# Patient Record
Sex: Female | Born: 1960
Health system: Southern US, Community
[De-identification: ages and names within clinical notes are randomized; demographics above are authoritative.]

## PROBLEM LIST (undated history)

## (undated) DIAGNOSIS — F419 Anxiety disorder, unspecified: Secondary | ICD-10-CM

## (undated) DIAGNOSIS — E119 Type 2 diabetes mellitus without complications: Secondary | ICD-10-CM

## (undated) DIAGNOSIS — E079 Disorder of thyroid, unspecified: Secondary | ICD-10-CM

## (undated) DIAGNOSIS — I10 Essential (primary) hypertension: Secondary | ICD-10-CM

## (undated) DIAGNOSIS — F32A Depression, unspecified: Secondary | ICD-10-CM

## (undated) HISTORY — DX: Essential (primary) hypertension: I10

## (undated) HISTORY — PX: BACK SURGERY: SHX140

## (undated) HISTORY — DX: Anxiety disorder, unspecified: F41.9

## (undated) HISTORY — DX: Type 2 diabetes mellitus without complications: E11.9

## (undated) HISTORY — DX: Depression, unspecified: F32.A

## (undated) HISTORY — PX: BUNIONECTOMY: SHX129

## (undated) HISTORY — DX: Disorder of thyroid, unspecified: E07.9

---

## 1998-05-05 ENCOUNTER — Other Ambulatory Visit: Admission: RE | Admit: 1998-05-05 | Discharge: 1998-05-05 | Payer: Self-pay | Admitting: Obstetrics and Gynecology

## 1999-04-13 ENCOUNTER — Other Ambulatory Visit: Admission: RE | Admit: 1999-04-13 | Discharge: 1999-04-13 | Payer: Self-pay | Admitting: *Deleted

## 1999-04-29 ENCOUNTER — Emergency Department (HOSPITAL_COMMUNITY): Admission: EM | Admit: 1999-04-29 | Discharge: 1999-04-29 | Payer: Self-pay

## 2000-04-28 ENCOUNTER — Other Ambulatory Visit: Admission: RE | Admit: 2000-04-28 | Discharge: 2000-04-28 | Payer: Self-pay | Admitting: *Deleted

## 2001-05-08 ENCOUNTER — Other Ambulatory Visit: Admission: RE | Admit: 2001-05-08 | Discharge: 2001-05-08 | Payer: Self-pay | Admitting: Obstetrics and Gynecology

## 2002-05-27 ENCOUNTER — Other Ambulatory Visit: Admission: RE | Admit: 2002-05-27 | Discharge: 2002-05-27 | Payer: Self-pay | Admitting: Obstetrics and Gynecology

## 2004-05-20 ENCOUNTER — Emergency Department (HOSPITAL_COMMUNITY): Admission: EM | Admit: 2004-05-20 | Discharge: 2004-05-20 | Payer: Self-pay | Admitting: Emergency Medicine

## 2006-06-02 ENCOUNTER — Emergency Department (HOSPITAL_COMMUNITY): Admission: EM | Admit: 2006-06-02 | Discharge: 2006-06-02 | Payer: Self-pay | Admitting: Emergency Medicine

## 2010-11-01 ENCOUNTER — Other Ambulatory Visit: Payer: Self-pay | Admitting: Internal Medicine

## 2010-11-01 DIAGNOSIS — Z1231 Encounter for screening mammogram for malignant neoplasm of breast: Secondary | ICD-10-CM

## 2010-11-05 ENCOUNTER — Ambulatory Visit
Admission: RE | Admit: 2010-11-05 | Discharge: 2010-11-05 | Disposition: A | Discharge status: Home / Self Care | Payer: BC Managed Care – PPO | Source: Ambulatory Visit | Attending: Internal Medicine | Admitting: Internal Medicine

## 2010-11-05 DIAGNOSIS — Z1231 Encounter for screening mammogram for malignant neoplasm of breast: Secondary | ICD-10-CM

## 2011-11-21 ENCOUNTER — Other Ambulatory Visit: Payer: Self-pay | Admitting: Physician Assistant

## 2011-11-21 ENCOUNTER — Other Ambulatory Visit: Payer: Self-pay | Admitting: Internal Medicine

## 2011-11-21 DIAGNOSIS — Z1231 Encounter for screening mammogram for malignant neoplasm of breast: Secondary | ICD-10-CM

## 2011-11-28 ENCOUNTER — Ambulatory Visit
Admission: RE | Admit: 2011-11-28 | Discharge: 2011-11-28 | Disposition: A | Discharge status: Home / Self Care | Payer: BC Managed Care – PPO | Source: Ambulatory Visit | Attending: Internal Medicine | Admitting: Internal Medicine

## 2011-11-28 DIAGNOSIS — Z1231 Encounter for screening mammogram for malignant neoplasm of breast: Secondary | ICD-10-CM

## 2012-10-23 ENCOUNTER — Other Ambulatory Visit: Payer: Self-pay

## 2012-10-23 DIAGNOSIS — Z1231 Encounter for screening mammogram for malignant neoplasm of breast: Secondary | ICD-10-CM

## 2012-12-05 ENCOUNTER — Ambulatory Visit
Admission: RE | Admit: 2012-12-05 | Discharge: 2012-12-05 | Disposition: A | Discharge status: Home / Self Care | Payer: BC Managed Care – PPO | Source: Ambulatory Visit

## 2012-12-05 DIAGNOSIS — Z1231 Encounter for screening mammogram for malignant neoplasm of breast: Secondary | ICD-10-CM

## 2013-05-03 ENCOUNTER — Ambulatory Visit (INDEPENDENT_AMBULATORY_CARE_PROVIDER_SITE_OTHER): Payer: BC Managed Care – PPO

## 2013-05-03 VITALS — BP 129/71 | HR 69 | Resp 20 | Ht 69.5 in | Wt 190.0 lb

## 2013-05-03 DIAGNOSIS — M21621 Bunionette of right foot: Secondary | ICD-10-CM

## 2013-05-03 DIAGNOSIS — R52 Pain, unspecified: Secondary | ICD-10-CM

## 2013-05-03 DIAGNOSIS — M21619 Bunion of unspecified foot: Secondary | ICD-10-CM

## 2013-05-03 DIAGNOSIS — E039 Hypothyroidism, unspecified: Secondary | ICD-10-CM | POA: Insufficient documentation

## 2013-05-03 DIAGNOSIS — E089 Diabetes mellitus due to underlying condition without complications: Secondary | ICD-10-CM | POA: Insufficient documentation

## 2013-05-03 DIAGNOSIS — M2010 Hallux valgus (acquired), unspecified foot: Secondary | ICD-10-CM

## 2013-05-03 DIAGNOSIS — I10 Essential (primary) hypertension: Secondary | ICD-10-CM | POA: Insufficient documentation

## 2013-05-03 DIAGNOSIS — E079 Disorder of thyroid, unspecified: Secondary | ICD-10-CM | POA: Insufficient documentation

## 2013-05-03 DIAGNOSIS — M2011 Hallux valgus (acquired), right foot: Secondary | ICD-10-CM

## 2013-05-03 DIAGNOSIS — E109 Type 1 diabetes mellitus without complications: Secondary | ICD-10-CM | POA: Insufficient documentation

## 2013-05-03 DIAGNOSIS — E0869 Diabetes mellitus due to underlying condition with other specified complication: Secondary | ICD-10-CM | POA: Insufficient documentation

## 2013-05-03 NOTE — Patient Instructions (Signed)
Pre-Operative Instructions  Congratulations, you have decided to take an important step to improving your quality of life.  You can be assured that the doctors of [facility] will be with you every step of the way.  1. Plan to be at the surgery center/hospital at least 1 (one) hour prior to your scheduled time unless otherwise directed by the surgical center/hospital staff.  You must have a responsible adult accompany you, remain during the surgery and drive you home.  Make sure you have directions to the surgical center/hospital and know how to get there on time. 2. For hospital based surgery you will need to obtain a history and physical form from your family physician within 1 month prior to the date of surgery- we will give you a form for you primary physician.  3. We make every effort to accommodate the date you request for surgery.  There are however, times where surgery dates or times have to be moved.  We will contact you as soon as possible if a change in schedule is required.   4. No Aspirin/Ibuprofen for one week before surgery.  If you are on aspirin, any non-steroidal anti-inflammatory medications (Mobic, Aleve, Ibuprofen) you should stop taking it 7 days prior to your surgery.  You make take Tylenol  For pain prior to surgery.  5. Medications- If you are taking daily heart and blood pressure medications, seizure, reflux, allergy, asthma, anxiety, pain or diabetes medications, make sure the surgery center/hospital is aware before the day of surgery so they may notify you which medications to take or avoid the day of surgery. 6. No food or drink after midnight the night before surgery unless directed otherwise by surgical center/hospital staff. 7. No alcoholic beverages 24 hours prior to surgery.  No smoking 24 hours prior to or 24 hours after surgery. 8. Wear loose pants or shorts- loose enough to fit over bandages, boots, and casts. 9. No slip on shoes, sneakers are best. 10. Bring  your boot with you to the surgery center/hospital.  Also bring crutches or a walker if your physician has prescribed it for you.  If you do not have this equipment, it will be provided for you after surgery. 11. If you have not been contracted by the surgery center/hospital by the day before your surgery, call to confirm the date and time of your surgery. 12. Leave-time from work may vary depending on the type of surgery you have.  Appropriate arrangements should be made prior to surgery with your employer. 13. Prescriptions will be provided immediately following surgery by your doctor.  Have these filled as soon as possible after surgery and take the medication as directed. 14. Remove nail polish on the operative foot. 15. Wash the night before surgery.  The night before surgery wash the foot and leg well with the antibacterial soap provided and water paying special attention to beneath the toenails and in between the toes.  Rinse thoroughly with water and dry well with a towel.  Perform this wash unless told not to do so by your physician.  Enclosed: 1 Ice pack (please put in freezer the night before surgery)   1 Hibiclens skin cleaner   Pre-op Instructions  If you have any questions regarding the instructions, do not hesitate to call our office.  [place]: [address] [place], [place] [address] [phone]  [place]: [address] [place], [place] [address] [phone]  [place]: [address]  [place],  [address] [phone]  Dr. Bethel Gaglio   Tuchman DPM, Dr. Norman Regal DPM Dr. Tionne Dayhoff DPM, Dr. M. Todd Hyatt DPM, Dr. Kathryn Egerton DPM 

## 2013-05-03 NOTE — Progress Notes (Signed)
Subjective:    Patient ID: Julia Holt, female    DOB: [birth year], 52 y.o.   MRN: [id number] "I want to have surgery in January on my right foot for a Bunion and a Bunionette."  HPI patient is approximately one year after surgical repair of her contralateral or left foot and is pleased with her results. Continues to have some contracture at the left great toe joint however no pain or discomfort. Right foot has mild plantar fascial symptomology may address with orthoses in the future however does have some dorsal medial bunion deformity as well as tailor bunion deformity and prominence of the fifth MTP area right foot.    Review of Systems  Constitutional: Negative.   Eyes: Negative.   Respiratory: Negative.   Cardiovascular: Negative.   Gastrointestinal: Negative.   Endocrine: Negative.   Musculoskeletal: Negative.   Skin: Negative.   Allergic/Immunologic: Negative.   Neurological: Negative.   Hematological: Negative.   Psychiatric/Behavioral: Negative.   All other systems reviewed and are negative.       Objective:   Physical Exam  Vitals reviewed. Constitutional: She is oriented to person, place, and time. She appears well-developed and well-nourished.  Cardiovascular:  Pulses:      Dorsalis pedis pulses are 2+ on the right side, and 2+ on the left side.       Posterior tibial pulses are 2+ on the right side, and 2+ on the left side.  Capillary refill time 3 seconds all digits. Skin temperature warm turgor normal. No edema rubor pallor or varicosities noted. Well-healed incisions first and fifth metatarsal areas left foot.  Musculoskeletal:  Orthopedic biomechanical exam reveals rectus foot type left status post bunion and tailor bunion correction still slight elevation of the hallux with limitation of motion plantar flexion. Right foot has pain and capsulitis on dorsiflexion plantarflexion of the great toe joint. There is dorsal and medial eminence present clinically and  radiographically has a long first metatarsal with deviation of sesamoids. There is also significant tailor bunion deformity with keratoses sub-fifth MTP area right. Per patient request she is interested in bunion and tailor bunion correction of the right foot at this time.  Neurological: She is alert and oriented to person, place, and time. She has normal strength and normal reflexes.  Epicritic and proprioceptive sensations intact and symmetric bilateral normal plantar response and DTRs noted.  Skin: Skin is warm and dry. No cyanosis. Nails show no clubbing.  Dermatologic the skin color and pigment normal hair growth absent bilateral nails unremarkable healed incisions first and fifth metatarsal areas left foot  Psychiatric: She has a normal mood and affect. Her behavior is normal.          Assessment & Plan:  Assessment this time is mild to moderate bunion deformity right with long first metatarsal and mild functional hallux limitus right foot with capsulitis. There is also tailor bunion deformity right foot with digital contracture. Flexible contractures third and fourth digits were also noted by the patient these are asymptomatic. Plan at this time discussed options x-rays were reviewed and at this time consent form for decompression Austin bunionectomy with pin fixation right as well and this tailor bunionectomy with fifth metatarsal osteotomy and screw fixation right. Consent form was reviewed and signed all questions asked medication are answered surgery be scheduled at her convenience with appropriate followup thereafter. Should note patient does have diabetes with insulin dependence she is on an insulin pump her sugars well-managed last A1c was 5.8. Begin  diabetes with no complications noted.  Alvan Dame DPM

## 2013-06-18 ENCOUNTER — Telehealth: Payer: Self-pay | Admitting: *Deleted

## 2013-06-18 NOTE — Telephone Encounter (Signed)
Pt states lost the pamphlet to [facility] and needed the time and directions to their facility.  Pt also described her pre-op bag and may have lost the pre-op instruction as well. I faxed a copy of the [facility] pamphlet back with a map and the pre-op instructions.

## 2013-06-27 ENCOUNTER — Telehealth: Payer: Self-pay | Admitting: *Deleted

## 2013-06-27 NOTE — Telephone Encounter (Signed)
I rescheduled pt to 07/01/2013, due to Dr Blenda Mounts had a family emergency and would be out of town.  Pt agreed.  I also informed [facility].  These changes were done 06/24/2013.

## 2013-07-08 DIAGNOSIS — M19079 Primary osteoarthritis, unspecified ankle and foot: Secondary | ICD-10-CM

## 2013-07-08 DIAGNOSIS — M2010 Hallux valgus (acquired), unspecified foot: Secondary | ICD-10-CM

## 2013-07-08 DIAGNOSIS — M2020 Hallux rigidus, unspecified foot: Secondary | ICD-10-CM

## 2013-07-08 DIAGNOSIS — M21619 Bunion of unspecified foot: Secondary | ICD-10-CM

## 2013-07-09 ENCOUNTER — Telehealth: Payer: Self-pay | Admitting: *Deleted

## 2013-07-09 NOTE — Telephone Encounter (Signed)
Pt states the 04/2013 boot doesn't hold pressure when pumped up, will send her husband to pick up a large Air Fracture walker.

## 2013-07-09 NOTE — Telephone Encounter (Signed)
Pt states the nurse at the surgical put on a boot that was too small and her toes hang over.  Pt states she still has the boot from 04/2013 surgery, can she change to it.  I told her that would be fine as long as it still pumped up.  Pt agreed.

## 2013-07-10 NOTE — Telephone Encounter (Signed)
Called pt. States that she was given and fitted for a medium boot initially. Her husband came to pick up large boot and she feels more comfortable now. Pt asking if she was charged for 2 boots? Will refer to insurance and billing for response.

## 2013-07-16 ENCOUNTER — Ambulatory Visit (INDEPENDENT_AMBULATORY_CARE_PROVIDER_SITE_OTHER): Payer: BC Managed Care – PPO

## 2013-07-16 VITALS — BP 123/75 | HR 80 | Resp 16

## 2013-07-16 DIAGNOSIS — Z9889 Other specified postprocedural states: Secondary | ICD-10-CM

## 2013-07-16 DIAGNOSIS — M21619 Bunion of unspecified foot: Secondary | ICD-10-CM

## 2013-07-16 DIAGNOSIS — E109 Type 1 diabetes mellitus without complications: Secondary | ICD-10-CM

## 2013-07-16 DIAGNOSIS — M21621 Bunionette of right foot: Secondary | ICD-10-CM

## 2013-07-16 DIAGNOSIS — M2010 Hallux valgus (acquired), unspecified foot: Secondary | ICD-10-CM

## 2013-07-16 NOTE — Patient Instructions (Signed)

## 2013-07-16 NOTE — Progress Notes (Signed)
   Subjective:    Patient ID: KAYSHA PARSELL, female    DOB: 04/17/1961, 53 y.o.   MRN: [id number]  HPI Comments: "It has been doing okay"  DOS 07-08-2013 POV Bunionectomy and met osteo 5th right      Review of Systems no new finding     Objective:   Physical Exam Neurovascular status intact incisions clean dry well coapted dressings intact and dry slight ecchymosis and edema consistent with postop course edema of lesser digits and ecchymosis of toes. Otherwise findings unremarkable incision well coapted x-rays reveal good position the osteotomy first and fifth metatarsals with intact fixation. Patient having little or no pain or discomfort. Air fracture boot intact for ambulation. The boot is reapplied after dressing change patient is also dispensed anklet to maintain compression starting Sunday after removing dressings a resume normal bathing and hygiene. Good postop progress noted       Assessment & Plan:  Assessment good postop progress following Austin bunionectomy right foot as well as tailor bunionectomy right foot. Anklet is dispensed at this time maintain air fracture boot till Sunday start normal bathing and hygiene thereafter and maintain anklet. 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 Also dispensed and Parking permit application for 6 months. Reappointed 4 weeks for followup x-ray and reevaluation. Maintain weightbearing in the air fracture boot at all times until that point. Do active passive range of motion exercises of toes as instructed.  Harriet Masson DPM

## 2013-08-14 ENCOUNTER — Ambulatory Visit (INDEPENDENT_AMBULATORY_CARE_PROVIDER_SITE_OTHER): Payer: BC Managed Care – PPO

## 2013-08-14 VITALS — BP 131/80 | HR 75 | Resp 18

## 2013-08-14 DIAGNOSIS — M21619 Bunion of unspecified foot: Secondary | ICD-10-CM

## 2013-08-14 DIAGNOSIS — M21621 Bunionette of right foot: Secondary | ICD-10-CM

## 2013-08-14 DIAGNOSIS — M2010 Hallux valgus (acquired), unspecified foot: Secondary | ICD-10-CM

## 2013-08-14 DIAGNOSIS — Z09 Encounter for follow-up examination after completed treatment for conditions other than malignant neoplasm: Secondary | ICD-10-CM

## 2013-08-14 NOTE — Progress Notes (Signed)
   Subjective:    Patient ID: Julia Holt, female    DOB: [birth year], 53 y.o.   MRN: [id number]  HPI I had my surgery on 07-08-2013 and I do have some sutures that did come out and not too much pain and I am ready to get this boot off    Review of Systems no new changes or fine     Objective:   Physical Exam Neurovascular status is intact pedal pulses palpable there is good range of motion dorsiflexion still some tightness and plantar flexion of the right great toe joint the fifth toe is a good rectus position incisions clean dry well coapted x-rays reveal good consolidation of the osteotomies and intact fixation no displacements noted.       Assessment & Plan:  Assessment good clinical and radiographic is good postop progress following Austin bunionectomy right foot as well as tailor bunion right foot with screw fixation. Maintain anklet for compression may discontinue boot and resume come for walking tennis or athletic shoe for the next 2 months. Reappointed 2 months for long-term postop followup. 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 Also read or additional 6 months of handicap parking excess. Followup in 2 months for postop evaluation contact me change difficulties in the interim.  Harriet Masson DPM

## 2013-08-14 NOTE — Patient Instructions (Signed)
Betadine Soak Instructions  Purchase an 8 oz. bottle of BETADINE solution (Povidone)  THE DAY AFTER THE PROCEDURE  Place 1 tablespoon of betadine solution in a quart of warm tap water.  Submerge your foot or feet with outer bandage intact for the initial soak; this will allow the bandage to become moist and wet for easy lift off.  Once you remove your bandage, continue to soak in the solution for 20 minutes.  This soak should be done twice a day.  Next, remove your foot or feet from solution, blot dry the affected area and cover.  You may use a band aid large enough to cover the area or use gauze and tape.  Apply other medications to the area as directed by the doctor such as cortisporin otic solution (ear drops) or neosporin.  IF YOUR SKIN BECOMES IRRITATED WHILE USING THESE INSTRUCTIONS, IT IS OKAY TO SWITCH TO EPSOM SALTS AND WATER OR WHITE VINEGAR AND WATER.  Continue with range of motion exercises moving a great toe both upper and down. You 100 toe pushups a day.

## 2013-08-27 ENCOUNTER — Telehealth: Payer: Self-pay | Admitting: *Deleted

## 2013-08-27 NOTE — Telephone Encounter (Signed)
Pt states she can not wear the [employer] athletic shoe on her right foot, as Dr Blenda Mounts recommended.  Pt states she thinks she had this same problem with the left foot post-op last year.  I told pt to gradually go into the athletic shoe, by starting out in the compression sock and athletic shoe 1st thing in the morning and either wear until she has the swelling discomfort or wear 1 to 2 hours and then switch into the surgical shoe, either way try to increase the athletic shoe wearing time by 15 to 30 minutes each day.  Pt agrees.

## 2013-09-23 ENCOUNTER — Telehealth: Payer: Self-pay | Admitting: *Deleted

## 2013-09-23 NOTE — Telephone Encounter (Signed)
I'm freaking out.  I had a Bunion surgery and a hammertoe surgery done.  I missed my second follow up appointment.  My toe is sticking up.  I don't know what he's going to do.  I've tried to bring it down.  This didn't happen with the other foot.  I've been exercising it like he suggested.  I told her he would xray it tomorrow and may give her a splint to help reposition it.  She seemed to be calmed a little.  She said she'd see [place] tomorrow.

## 2013-09-24 ENCOUNTER — Ambulatory Visit (INDEPENDENT_AMBULATORY_CARE_PROVIDER_SITE_OTHER): Payer: BC Managed Care – PPO

## 2013-09-24 VITALS — BP 138/79 | HR 78 | Resp 16

## 2013-09-24 DIAGNOSIS — Z9889 Other specified postprocedural states: Secondary | ICD-10-CM

## 2013-09-24 DIAGNOSIS — M21621 Bunionette of right foot: Secondary | ICD-10-CM

## 2013-09-24 DIAGNOSIS — M2010 Hallux valgus (acquired), unspecified foot: Secondary | ICD-10-CM

## 2013-09-24 DIAGNOSIS — M21619 Bunion of unspecified foot: Secondary | ICD-10-CM

## 2013-09-24 NOTE — Patient Instructions (Signed)
ICE INSTRUCTIONS  Apply ice or cold pack to the affected area at least 3 times a day for 10-15 minutes each time.  You should also use ice after prolonged activity or vigorous exercise.  Do not apply ice longer than 20 minutes at one time.  Always keep a cloth between your skin and the ice pack to prevent burns.  Being consistent and following these instructions will help control your symptoms.  We suggest you purchase a gel ice pack because they are reusable and do bit leak.  Some of them are designed to wrap around the area.  Use the method that works best for you.  Here are some other suggestions for icing.   Use a frozen bag of peas or corn-inexpensive and molds well to your body, usually stays frozen for 10 to 20 minutes.  Wet a towel with cold water and squeeze out the excess until it's damp.  Place in a bag in the freezer for 20 minutes. Then remove and use.  Alternate warm compresses and ice packs after aggressive exercising of the great toe joint. The distress 100-200 toe  pull downs. Aggressively plantar flexor pull down on the great toe joint giving counterpressure to the ball of the foot as demonstrated in the office keep doing his aggressive stretching exercises in the down fashion the up movement is already excellent does not need to be addressed continue with the down stretching exercises 3 or 4 times daily followup in 3 months if no improvement

## 2013-09-24 NOTE — Progress Notes (Signed)
   Subjective:    Patient ID: MIONNA ADVINCULA, female    DOB: 1961-03-05, 53 y.o.   MRN: [id number]  HPI Comments: "My big toe is not laying flat"  DOS 07-08-2013 POV Austin bunionectomy and tailor's bunionectomy right foot      Review of Systems noted changes or findings     Objective:   Physical Exam Extremity objective findings as follows pedal pulses are palpable epicritic and proprioceptive sensations intact there is normal plantar response DTRs not elicited dermatologically skin color pigment normal incision well coapted there is contracture the dorsal incision noted patient has been doing aggressive dorsiflexion exercises however on plantar flexion has been plantar flexing the IP joint and at the MTP joint. Patient continues have contracture at the MTP joint slightly dorsally displaced although at this time demonstrated again proper plantar flexion exercises degree joint utilizing both left and right hand to help with the toe appropriately. Patient will do 100 200 to pull downs daily as instructed today should improve her range of motion exercises is able to get approximately 70 dorsiflexion but cannot get down to you neutral plantar flexion about 5 dorsiflex patient will continue with aggressive motion did give her the option of physical therapy which declined at this time we'll do her own therapy at home daily.       Assessment & Plan:  Assessment good postop progress consolidation the osteotomies is noted first and fifth metatarsals only concern is a lack of plantar flexion to purchase the hallux patient is advised appropriate range of motion exercises at this time reassess in 1-3 months if no improvement consider physical therapy possibly the steroid injections if the joint maintain stiffness. Next  Harriet Masson DPM

## 2013-10-16 ENCOUNTER — Other Ambulatory Visit: Payer: Self-pay

## 2013-10-16 DIAGNOSIS — Z1231 Encounter for screening mammogram for malignant neoplasm of breast: Secondary | ICD-10-CM

## 2013-11-05 ENCOUNTER — Ambulatory Visit: Payer: BC Managed Care – PPO

## 2013-12-09 ENCOUNTER — Ambulatory Visit
Admission: RE | Admit: 2013-12-09 | Discharge: 2013-12-09 | Disposition: A | Discharge status: Home / Self Care | Payer: BC Managed Care – PPO | Source: Ambulatory Visit

## 2013-12-09 DIAGNOSIS — Z1231 Encounter for screening mammogram for malignant neoplasm of breast: Secondary | ICD-10-CM

## 2013-12-25 ENCOUNTER — Ambulatory Visit: Payer: Self-pay

## 2013-12-25 DIAGNOSIS — Z9889 Other specified postprocedural states: Secondary | ICD-10-CM

## 2014-04-16 ENCOUNTER — Encounter: Payer: Self-pay | Admitting: Podiatry

## 2014-04-16 ENCOUNTER — Ambulatory Visit (INDEPENDENT_AMBULATORY_CARE_PROVIDER_SITE_OTHER): Payer: BC Managed Care – PPO | Admitting: Podiatry

## 2014-04-16 ENCOUNTER — Ambulatory Visit (INDEPENDENT_AMBULATORY_CARE_PROVIDER_SITE_OTHER): Payer: BC Managed Care – PPO

## 2014-04-16 VITALS — BP 122/68 | HR 71 | Resp 14 | Ht 69.5 in | Wt 180.0 lb

## 2014-04-16 DIAGNOSIS — M2010 Hallux valgus (acquired), unspecified foot: Secondary | ICD-10-CM

## 2014-04-16 DIAGNOSIS — M216X1 Other acquired deformities of right foot: Secondary | ICD-10-CM

## 2014-04-16 DIAGNOSIS — M2061 Acquired deformities of toe(s), unspecified, right foot: Secondary | ICD-10-CM

## 2014-04-16 DIAGNOSIS — M216X2 Other acquired deformities of left foot: Secondary | ICD-10-CM

## 2014-04-16 NOTE — Progress Notes (Signed)
   Subjective:    Patient ID: Julia Holt, female    DOB: 03-01-1961, 53 y.o.   MRN: [id number]  HPI Comments: Patient returns to the office today with complaints of her right big toe "sticking up". She states that she recently underwent a bunionectomy on 07/08/2013 by Dr. Blenda Mounts. After the surgery she states that she did not have great motion and she was doing range of motion exercises. She states that over the last couple months she has noticed increasing elevation to the right big toe. She states that the areas painful particularly when walking on hardwood floors. She has no pain directly over the digit. She states that she is diabetic and she is concerned about her feet. She is continuing with range of motion exercises without any resolution. Her last HbA1c was 5.6 check 2 months ago. She denies any history of ulceration, claudication symptoms, tingling or numbness. No other complaints at this time.      Review of Systems  Endocrine:       2 months ago A1C 5.6  All other systems reviewed and are negative.      Objective:   Physical Exam AAO x3, NAD DP/PT pulses palpable bilaterally, CRT less than 3 seconds Protective sensation intact with Simms Weinstein monofilament, vibratory sensation intact, Achilles tendon reflex intact Right hallux elevation of the level of the MTPJ upon weightbearing. There is no overlying erythema or open lesions over the dorsal aspect of the hallux. There is a decrease in plantar flexion of the first MTPJ. The extensor tendon does not appear to be tight. There is prominence and the sesamoids as well as the lesser metatarsal heads. There is atrophy of the fat pad. There is a hyperkeratotic lesion right foot submetatarsal 2 and submetatarsal 5. On the left foot there is a scar from prior bunionectomy. Submetatarsal 2 hyperkeratotic lesion as well as submetatarsal 5. Hammertoe contractures on the right foot. Decrease in medial arch height on weightbearing. No open  lesions. No calf pain with compression, swelling, warmth, erythema      Assessment & Plan:  53 year old female with elevation of the right hallux status post bunionectomy. This appears to be more of a structural problem as the extensor tendon does not appear to be tight. -X-rays were obtained and reviewed with the patient. -Conservative versus surgical treatment discussed including alternatives, risks, complications. -At this time recommend custom inserts to help alleviate high-pressure areas to help prevent any skin breakdown. Scans were taken today and sent to [facility].  -Also she was inquiring about other treatments to help her motion. I prescribed physical therapy for her to see if there are any modalities that can help her symptoms. -Follow-up after the inserts are made or sooner if they've problems are to arise. In meantime call the office with any questions, concerns.

## 2014-04-18 ENCOUNTER — Telehealth: Payer: Self-pay | Admitting: *Deleted

## 2014-04-18 NOTE — Telephone Encounter (Signed)
"  I came to see Dr. Jacqualyn Posey Wednesday morning.  I was fitted for a pair of orthotics, takes 3 weeks for them to come in.  I was wondering if someone could please call me?  I have a couple of questions about orthotics."  I returned her call.  She stated, "I spoke to someone this morning and she said that she was going to get someone to check my insurance to see if it will be covered.  I don't want to end up having to pay $500 out of pocket for those."  I told her I would check on the status of that.  I spoke to [place] in insurance and she said she would check on it.  I informed the patient.

## 2014-05-09 ENCOUNTER — Ambulatory Visit: Payer: BC Managed Care – PPO

## 2014-05-09 DIAGNOSIS — M2010 Hallux valgus (acquired), unspecified foot: Secondary | ICD-10-CM

## 2014-05-09 NOTE — Progress Notes (Signed)
Pt is here to PUO 

## 2014-05-09 NOTE — Patient Instructions (Signed)

## 2014-05-20 ENCOUNTER — Telehealth: Payer: Self-pay | Admitting: *Deleted

## 2014-05-20 NOTE — Telephone Encounter (Signed)
Pt states she took the factory inserts from a pair of strap-shoes, now they are too loose, what to do?  I encouraged pt to try the orthotics again over top the factory inserts to see if that took up the additional space, could also try thicker socks or we may have a in-between thickness insert that may work.  Pt agreed.

## 2014-06-04 ENCOUNTER — Ambulatory Visit (INDEPENDENT_AMBULATORY_CARE_PROVIDER_SITE_OTHER): Payer: BC Managed Care – PPO | Admitting: Podiatry

## 2014-06-04 VITALS — BP 120/70 | HR 61 | Resp 16

## 2014-06-04 DIAGNOSIS — M79673 Pain in unspecified foot: Secondary | ICD-10-CM

## 2014-06-04 DIAGNOSIS — Q828 Other specified congenital malformations of skin: Secondary | ICD-10-CM

## 2014-06-04 DIAGNOSIS — D239 Other benign neoplasm of skin, unspecified: Secondary | ICD-10-CM

## 2014-06-04 NOTE — Patient Instructions (Signed)
Remove bandage tomorrow. If the areas becomes painful or causes problems before, take the bandage off and wash the area.  Monitor for any signs/symptoms of infection. Call the office immediately if any occur or go directly to the emergency room. Call with any questions/concerns.

## 2014-06-08 NOTE — Progress Notes (Signed)
Patient ID: Julia Holt, female   DOB: 09-Jan-1961, 53 y.o.   MRN: [id number]  Subjective: 53 year old female presents the office today with complaints of a painful corn on the bottom of her left foot. She states that the area is painful particularly with pressure in shoe gear. She has started to wear orthotics some and feels that they are fitting comfortably. She is unsure of this callus started before or after starting to wear the orthotics. Her last blood sugar was 106. Denies any acute changes his last appointment and no other complaints at this time. Denies any systemic complaints as fevers, chills, nausea, vomiting.  Objective: AAO 3, NAD DP/PT pulses palpable bilaterally, CRT less than 3 seconds Protective sensation intact with Simms Weinstein monofilament, vibratory sensation intact, Achilles tendon reflex intact. Hyperkeratotic lesions left foot submetatarsal 4 and 5.  Upon debridement no open lesions. There is no clinical signs of infection. There is no overlying edema, erythema, increase in warmth. There is mild atrophy of the fat pad. No areas of pinpoint bony tenderness or pain with vibratory sensation. No overlying edema, erythema, increase in warmth to bilateral lower extremity's. No pain with calf compression, swelling, warmth, erythema.  Assessment: 53 year old female with symptomatic porokeratosis left foot submetatarsal 4/5.  Plan: -Treatment options were discussed the patient including alternatives, risks, complications. -Lesions are sharply debrided 2 without complications. Salinocaine was applied over the fourth metatarsal porokeratosis with a donut pad and occlusive dressing. Follow-up care was discussed with the patient. Monitor for any clinical signs or symptoms of infection and directed to call the office and medially to any occur or go to the emergency room. -Discussed the patient to continue to wear the orthotics however patient in to see if this area is having  increased pressure with the orthotic. If there appears to be increased pressure could have them modified. -Follow-up as needed. In the meantime, call the office with any questions, concerns, change in symptoms.

## 2014-06-19 ENCOUNTER — Ambulatory Visit (INDEPENDENT_AMBULATORY_CARE_PROVIDER_SITE_OTHER): Payer: BC Managed Care – PPO | Admitting: Podiatry

## 2014-06-19 ENCOUNTER — Encounter: Payer: Self-pay | Admitting: Podiatry

## 2014-06-19 VITALS — BP 123/63 | HR 80 | Resp 12

## 2014-06-19 DIAGNOSIS — M79673 Pain in unspecified foot: Secondary | ICD-10-CM

## 2014-06-23 NOTE — Progress Notes (Signed)
Patient ID: Julia Holt, female   DOB: March 01, 1961, 54 y.o.   MRN: [id number]  Subjective: 54 year old female returns to the office for orthotic adjustment. She states that after last appointment when the salinocaine was applied the area to the left foot has softened and she currently denies any pain associated with the area. She does state that she feels as if she is walking on a brick with the orthotics in the toe area. She says the actual orthotic within the arch of the foot is very comfortable however he is inquiring about if there is any additional padding that can be applied to the ball the foot. Denies any systemic complaints such as fevers, chills, nausea, vomiting. No acute changes since last appointment, and no other complaints at this time.   Objective: AAO x3, NAD DP/PT pulses palpable bilaterally, CRT less than 3 seconds Protective sensation intact with Simms Weinstein monofilament, vibratory sensation intact, Achilles tendon reflex intact The left foot there is no significant hyperkeratotic tissue buildup at the site of prior concern. There is no open lesions or pre-ulcerative lesions to bilateral lower extremity is. There is no areas of pinpoint bony tenderness or pain with vibratory sensation. MMT 5/5, ROM WNL No overlying edema, erythema increase in warmth to bilateral lower extremities. No pain with calf compression, swelling, warmth, erythema.   Assessment: 55 year old female presents for orthotic adjustment  Plan: -All treatment options discussed with the patient including all alternatives, risks, complications.  -Currently the foot porokeratosis site are doing well and there is no significant tissue buildup. -His cast orthotic adjustment with the patient which would include a larger metatarsal bar and padding to the toe area. Patient agrees to these changes in the orthotics are sent back to [facility] for modification. -Follow-up once the orthotics arrive or sooner should  any problems arise. Patient encouraged to call the office with any questions, concerns, change in symptoms.

## 2014-07-08 ENCOUNTER — Ambulatory Visit: Payer: BLUE CROSS/BLUE SHIELD | Admitting: *Deleted

## 2014-07-08 DIAGNOSIS — M79673 Pain in unspecified foot: Secondary | ICD-10-CM

## 2014-07-08 NOTE — Progress Notes (Signed)
PICKING UP MY ORTHOTICS AND CHECKING THAT ADJUSTMENTS ARE IN THE CORRECT PLACE

## 2014-07-08 NOTE — Patient Instructions (Signed)

## 2014-08-30 ENCOUNTER — Encounter (HOSPITAL_BASED_OUTPATIENT_CLINIC_OR_DEPARTMENT_OTHER): Payer: Self-pay

## 2014-08-30 ENCOUNTER — Emergency Department (HOSPITAL_BASED_OUTPATIENT_CLINIC_OR_DEPARTMENT_OTHER)
Admission: EM | Admit: 2014-08-30 | Discharge: 2014-08-30 | Disposition: A | Discharge status: Home / Self Care | Payer: BLUE CROSS/BLUE SHIELD | Attending: Emergency Medicine | Admitting: Emergency Medicine

## 2014-08-30 ENCOUNTER — Emergency Department (HOSPITAL_BASED_OUTPATIENT_CLINIC_OR_DEPARTMENT_OTHER): Payer: BLUE CROSS/BLUE SHIELD

## 2014-08-30 DIAGNOSIS — M79662 Pain in left lower leg: Secondary | ICD-10-CM | POA: Insufficient documentation

## 2014-08-30 DIAGNOSIS — E119 Type 2 diabetes mellitus without complications: Secondary | ICD-10-CM | POA: Insufficient documentation

## 2014-08-30 DIAGNOSIS — E079 Disorder of thyroid, unspecified: Secondary | ICD-10-CM | POA: Insufficient documentation

## 2014-08-30 DIAGNOSIS — Z79899 Other long term (current) drug therapy: Secondary | ICD-10-CM | POA: Insufficient documentation

## 2014-08-30 DIAGNOSIS — Z794 Long term (current) use of insulin: Secondary | ICD-10-CM | POA: Diagnosis not present

## 2014-08-30 DIAGNOSIS — I10 Essential (primary) hypertension: Secondary | ICD-10-CM | POA: Diagnosis not present

## 2014-08-30 DIAGNOSIS — M79605 Pain in left leg: Secondary | ICD-10-CM | POA: Diagnosis present

## 2014-08-30 LAB — D-DIMER, QUANTITATIVE (NOT AT ARMC): D DIMER QUANT: 0.75 ug{FEU}/mL — AB (ref 0.00–0.48)

## 2014-08-30 MED ORDER — ACETAMINOPHEN 325 MG PO TABS
650.0000 mg | ORAL_TABLET | Freq: Once | ORAL | Status: DC
  Filled 2014-08-30: qty 2

## 2014-08-30 MED ORDER — IBUPROFEN 400 MG PO TABS: 400.0000 mg | ORAL_TABLET | Freq: Four times a day (QID) | ORAL | Status: DC | PRN

## 2014-08-30 NOTE — ED Notes (Signed)
MD at bedside. 

## 2014-08-30 NOTE — ED Provider Notes (Signed)
CSN: [id number]     Arrival date & time 08/30/14  9030 History   First MD Initiated Contact with Patient 08/30/14 [phone]     Chief Complaint  Patient presents with  . left leg pain      (Consider location/radiation/quality/duration/timing/severity/associated sxs/prior Treatment) HPI Comments: Pt comes in with cc of L lower extremity pain. She has hx of DM. Pt has no hx of PE, DVT and denies any exogenous estrogen use, long distance travels or surgery in the past 6 weeks, active cancer, recent immobilization. However, she has had Leg pain recently and was seen by VEIN specialist in the recent past. Reports starting yday, she has been having calf pain. Pain is described as tightness and crampy. The pain is fairly constant. No recent trauma. Patient's father had a DVT.   The history is provided by the patient.    Past Medical History  Diagnosis Date  . Diabetes mellitus without complication   . Hypertension   . Thyroid disease    Past Surgical History  Procedure Laterality Date  . Back surgery    . Bunionectomy Left    Family History  Problem Relation Age of Onset  . Hypertension Mother   . Hyperlipidemia Mother   . Dementia Mother   . Alzheimer's disease Father    History  Substance Use Topics  . Smoking status: Never Smoker   . Smokeless tobacco: Not on file  . Alcohol Use: No   OB History    No data available     Review of Systems  Constitutional: Negative for fever.  Respiratory: Negative for shortness of breath.   Cardiovascular: Negative for chest pain.  Musculoskeletal: Positive for myalgias.  Skin: Negative for rash.  Allergic/Immunologic: Negative for immunocompromised state.  Hematological: Does not bruise/bleed easily.      Allergies  Sulfa antibiotics  Home Medications   Prior to Admission medications   Medication Sig Start Date End Date Taking? Authorizing Provider  AMLODIPINE BESY-BENAZEPRIL HCL PO Take 150 mg by mouth daily.    Historical  Provider, MD  Calcium Carb-Cholecalciferol (CALCIUM 600 + D PO) Take 1 tablet by mouth 2 (two) times daily.    Historical Provider, MD  glucose blood test strip 1 each by Other route as needed for other. Use as instructed    Historical Provider, MD  ibuprofen (ADVIL,MOTRIN) 400 MG tablet Take 1 tablet (400 mg total) by mouth every 6 (six) hours as needed. 08/30/14   Varney Biles, MD  insulin aspart (NOVOLOG) 100 UNIT/ML injection Inject 100 Units into the skin as directed.    Historical Provider, MD  levothyroxine (SYNTHROID, LEVOTHROID) 137 MCG tablet Take 137 mcg by mouth daily before breakfast.    Historical Provider, MD  Multiple Vitamins-Minerals (CENTRUM SILVER ULTRA WOMENS PO) Take 1 capsule by mouth daily.    Historical Provider, MD  quinapril (ACCUPRIL) 40 MG tablet Take 40 mg by mouth 2 (two) times daily.    Historical Provider, MD  spironolactone (ALDACTONE) 50 MG tablet Take 50 mg by mouth daily.    Historical Provider, MD  venlafaxine XR (EFFEXOR-XR) 150 MG 24 hr capsule Take 150 mg by mouth daily with breakfast.    Historical Provider, MD   BP 122/61 mmHg  Pulse 66  Temp(Src) 98.6 F (37 C) (Oral)  Resp 18  Ht 5\' 9"  (1.753 m)  Wt 180 lb (81.647 kg)  BMI 26.57 kg/m2  SpO2 96% Physical Exam  Constitutional: She is oriented to person, place, and time. She  appears well-developed and well-nourished.  HENT:  Head: Normocephalic and atraumatic.  Eyes: EOM are normal. Pupils are equal, round, and reactive to light.  Neck: Neck supple.  Cardiovascular: Normal rate, regular rhythm and normal heart sounds.   Pulmonary/Chest: Effort normal. No respiratory distress.  Abdominal: Soft. She exhibits no distension. There is no tenderness. There is no rebound and no guarding.  Musculoskeletal:  LLE - tenderness over the calf region. There is no erythema, deformity.   Neurological: She is alert and oriented to person, place, and time.  Skin: Skin is warm and dry.    ED Course   Procedures (including critical care time) Labs Review Labs Reviewed  D-DIMER, QUANTITATIVE - Abnormal; Notable for the following:    D-Dimer, Quant 0.75 (*)    All other components within normal limits    Imaging Review US Venous Img Lower Unilateral Left  08/30/2014   CLINICAL DATA:  LEFT lateral calf pain worse in mid calf, pain with walking, elevated D-dimer, recent diagnosis of venous dermatitis, history of DVT, diabetes, hypertension  EXAM: LEFT LOWER EXTREMITY VENOUS DOPPLER ULTRASOUND  TECHNIQUE: Gray-scale sonography with graded compression, as well as color Doppler and duplex ultrasound were performed to evaluate the lower extremity deep venous systems from the level of the common femoral vein and including the common femoral, femoral, profunda femoral, popliteal and calf veins including the posterior tibial, peroneal and gastrocnemius veins when visible. The superficial great saphenous vein was also interrogated. Spectral Doppler was utilized to evaluate flow at rest and with distal augmentation maneuvers in the common femoral, femoral and popliteal veins.  COMPARISON:  None  FINDINGS: Contralateral Common Femoral Vein: Respiratory phasicity is normal and symmetric with the symptomatic side. No evidence of thrombus. Normal compressibility.  Common Femoral Vein: No evidence of thrombus. Normal compressibility, respiratory phasicity and response to augmentation.  Saphenofemoral Junction: No evidence of thrombus. Normal compressibility and flow on color Doppler imaging.  Profunda Femoral Vein: No evidence of thrombus. Normal compressibility and flow on color Doppler imaging.  Femoral Vein: No evidence of thrombus. Normal compressibility, respiratory phasicity and response to augmentation.  Popliteal Vein: No evidence of thrombus. Normal compressibility, respiratory phasicity and response to augmentation.  Calf Veins: No evidence of thrombus. Normal compressibility and flow on color Doppler  imaging.  Superficial Great Saphenous Vein: No evidence of thrombus. Normal compressibility and flow on color Doppler imaging.  Venous Reflux:  None.  Other Findings:  None.  IMPRESSION: No evidence of deep venous thrombosis in the LEFT lower extremity.   Electronically Signed   By: Lavonia Dana M.D.   On: 08/30/2014 13:22     EKG Interpretation None      MDM   Final diagnoses:  Calf pain, left    Pt with LLE calf pain. No DVT risk factors, and WELLS score is 1 for tenderness at the calf region. Dimer ordered. There is no signs of infection, trauma.  2:27 PM DVT scan is neg. Will d.c.  Varney Biles, MD 08/30/14 1428

## 2014-08-30 NOTE — Discharge Instructions (Signed)
We saw you in the ER for the leg pain. All the results in the ER are normal, labs and imaging. We are not sure what is causing your symptoms. The workup in the ER is not complete, and is limited to screening for life threatening and emergent conditions only, so please see a primary care doctor for further evaluation.

## 2014-08-30 NOTE — ED Notes (Signed)
Patient transported to Ultrasound 

## 2014-08-30 NOTE — ED Notes (Signed)
Patient here with left lower leg pain that started yesterday, denies injury. Describes as cramping, tight pain. Wants to make sure she doesn't have DVT, no history of same

## 2014-09-23 ENCOUNTER — Other Ambulatory Visit: Payer: Self-pay

## 2014-09-23 DIAGNOSIS — Z1231 Encounter for screening mammogram for malignant neoplasm of breast: Secondary | ICD-10-CM

## 2014-12-12 ENCOUNTER — Ambulatory Visit
Admission: RE | Admit: 2014-12-12 | Discharge: 2014-12-12 | Disposition: A | Discharge status: Home / Self Care | Payer: BLUE CROSS/BLUE SHIELD | Source: Ambulatory Visit

## 2014-12-12 DIAGNOSIS — Z1231 Encounter for screening mammogram for malignant neoplasm of breast: Secondary | ICD-10-CM

## 2015-04-21 ENCOUNTER — Ambulatory Visit: Payer: BLUE CROSS/BLUE SHIELD | Admitting: Podiatry

## 2015-04-24 ENCOUNTER — Ambulatory Visit: Payer: BLUE CROSS/BLUE SHIELD | Admitting: Podiatry

## 2015-06-01 ENCOUNTER — Encounter: Payer: Self-pay | Admitting: Podiatry

## 2015-06-01 ENCOUNTER — Ambulatory Visit (INDEPENDENT_AMBULATORY_CARE_PROVIDER_SITE_OTHER): Payer: BLUE CROSS/BLUE SHIELD

## 2015-06-01 ENCOUNTER — Ambulatory Visit (INDEPENDENT_AMBULATORY_CARE_PROVIDER_SITE_OTHER): Payer: BLUE CROSS/BLUE SHIELD | Admitting: Podiatry

## 2015-06-01 VITALS — BP 142/79 | HR 76 | Resp 18

## 2015-06-01 DIAGNOSIS — R52 Pain, unspecified: Secondary | ICD-10-CM | POA: Diagnosis not present

## 2015-06-01 DIAGNOSIS — M216X1 Other acquired deformities of right foot: Secondary | ICD-10-CM

## 2015-06-01 DIAGNOSIS — M2041 Other hammer toe(s) (acquired), right foot: Secondary | ICD-10-CM

## 2015-06-01 NOTE — Progress Notes (Signed)
Patient ID: Julia Holt, female   DOB: Feb 07, 1961, 54 y.o.   MRN: [id number]  Subjective: 54 year old female presents the office today for concerns of her right hallux continues taking the air as older second toe stretching towards her big toe which has been ongoing for quite some time but appears to be worsening since I last saw her about a year ago. She states that she gets pressure on the ball of her foot for which she points the submetatarsal 2 area. She denies any recent injury,. The area does swell intermittently. No tingling or numbness. No other complaints at this time. She presents to underwent bunion correction as well as fifth metatarsal osteotomy with Dr. Blenda Mounts last year.   Objective: AAO 3, NAD DP/PT pulses 2/4, CRT less than 3 seconds Protective sensation intact with Simms Weinstein monofilament Toe sits in rectus position of the hallux there is been a recent bunionectomy performed of the healed scar. The toe has good dorsiflexion however is limited in plantar flexion of the first MTPJ. There is no pain or crepitation with first MTPJ range of motion. The second digit dusted in the medially deviated position at the level the MPJ and is abutting the hallux at this time. There is no pain or crepitation first MTPJ range of motion along with prominence the second metatarsal head plantarly with tenderness along the metatarsal head plantarly. No other areas of tenderness bilateral lower extremities. There is semirigid hammertoe contracturesof the second toe on the right foot. No open lesions or pre-ulcerative lesions. There is no pain with calf compression, swelling, warmth, erythema.  Assessment: 54 year old female with plantarflexed second metatarsal right foot, hammertoe, decreased plantar flexion of the first MTPJ status post bunionectomy  Plan: -Treatment options discussed including all alternatives, risks, and complications -X-rays were obtained and reviewed with the patient.   -Etiology of symptoms were discussed -At this time given her continued pain even with the use of orthotics and the deviation of the digit I did recommend an discussed the second metatarsal osteotomy with hammertoe correction. Also discussed with her possible first MTPJ release. She wishes to proceed with surgical whatever first the year. For now continue his shoe gear modifications orthotics. Follow-up at the start of the year or sooner if any problems are to arise.  Celesta Gentile, DPM

## 2015-06-29 ENCOUNTER — Ambulatory Visit: Payer: BLUE CROSS/BLUE SHIELD | Admitting: Podiatry

## 2015-07-06 ENCOUNTER — Ambulatory Visit (INDEPENDENT_AMBULATORY_CARE_PROVIDER_SITE_OTHER): Payer: BLUE CROSS/BLUE SHIELD | Admitting: Podiatry

## 2015-07-06 ENCOUNTER — Encounter: Payer: Self-pay | Admitting: Podiatry

## 2015-07-06 VITALS — BP 127/68 | HR 89 | Resp 18

## 2015-07-06 DIAGNOSIS — M216X1 Other acquired deformities of right foot: Secondary | ICD-10-CM | POA: Diagnosis not present

## 2015-07-06 DIAGNOSIS — M2041 Other hammer toe(s) (acquired), right foot: Secondary | ICD-10-CM | POA: Diagnosis not present

## 2015-07-06 DIAGNOSIS — M2450 Contracture, unspecified joint: Secondary | ICD-10-CM | POA: Diagnosis not present

## 2015-07-06 NOTE — Patient Instructions (Signed)

## 2015-07-07 NOTE — Progress Notes (Signed)
Patient ID: Julia Holt, female   DOB: [birth year], 55 y.o.   MRN: [id number]  Subjective:  55 year old female presents the office today to discuss surgical intervention for her right foot. She states that she does continue to have pain underneath the ball of her foot on the second toe joint as well as her big toe does not bend down. She also has hammertoes to her other toes which are painful with shoe gear and pressure and a been worsening. She has attempted conservative treatment including orthotics, offloading that any relief of symptoms at this time requesting surgical intervention to help decrease her pain and deformity. No ther complaints at this time.   Objective: AAO 3, NAD DP/PT pulses 2/4, CRT less than 3 seconds Protective sensation intact with Simms Weinstein monofilament Toe sits in rectus position of the hallux there is been a recent bunionectomy performed of the healed scar. The toe has good dorsiflexion however is limited in plantar flexion of the first MTPJ and does not purchase the ground completely. There is no pain or crepitation with first MTPJ range of motion The second digit sits in a medially deviated position at the level the MPJ and is abutting the hallux. There is no pain or crepitation first MTPJ range of motion along with prominence the second metatarsal head plantarly with tenderness along the metatarsal head plantarly. Hammertoes are present of the second, third and fourth toes as well as tenderness over the dorsal PIPJ. No other areas of tenderness bilateral lower extremities. No open lesions or pre-ulcerative lesions. There is no pain with calf compression, swelling, warmth, erythema.  Assessment: 55 year old female with plantarflexed second metatarsal right foot, hammertoe, decreased plantar flexion of the first MTPJ status post bunionectomy  Plan: -Treatment options discussed including all alternatives, risks, and complications -Previous x-rays were discussed and  reviewed with the patient. -Etiology of symptoms were discussed -At this time she is requesting surgical invention. Discussed the first MTPJ joint release, second metatarsal osteotomy with hammertoe repair of the second, third, fourth toes. She wishes to proceed with this. -The incision placement as well as the postoperative course was discussed with the patient. I discussed risks of the surgery which include, but not limited to, infection, bleeding, pain, swelling, need for further surgery, delayed or nonhealing, painful or ugly scar, numbness or sensation changes, over/under correction, recurrence, transfer lesions, further deformity, hardware failure, DVT/PE, loss of toe/foot. Patient understands these risks and wishes to proceed with surgery. The surgical consent was reviewed with the patient all 3 pages were signed. No promises or guarantees were given to the outcome of the procedure. All questions were answered to the best of my ability. Before the surgery the patient was encouraged to call the office if there is any further questions. The surgery will be performed at the [facility] on an outpatient basis.  Celesta Gentile, DPM

## 2015-07-13 ENCOUNTER — Ambulatory Visit: Payer: BLUE CROSS/BLUE SHIELD | Admitting: Podiatry

## 2015-07-22 ENCOUNTER — Encounter: Payer: Self-pay | Admitting: Podiatry

## 2015-07-22 DIAGNOSIS — M2041 Other hammer toe(s) (acquired), right foot: Secondary | ICD-10-CM | POA: Diagnosis not present

## 2015-07-22 DIAGNOSIS — M2011 Hallux valgus (acquired), right foot: Secondary | ICD-10-CM | POA: Diagnosis not present

## 2015-07-22 DIAGNOSIS — M21541 Acquired clubfoot, right foot: Secondary | ICD-10-CM | POA: Diagnosis not present

## 2015-07-22 DIAGNOSIS — M2040 Other hammer toe(s) (acquired), unspecified foot: Secondary | ICD-10-CM | POA: Diagnosis not present

## 2015-07-22 DIAGNOSIS — Z4889 Encounter for other specified surgical aftercare: Secondary | ICD-10-CM | POA: Diagnosis not present

## 2015-07-27 ENCOUNTER — Ambulatory Visit (INDEPENDENT_AMBULATORY_CARE_PROVIDER_SITE_OTHER): Payer: BLUE CROSS/BLUE SHIELD

## 2015-07-27 ENCOUNTER — Encounter: Payer: Self-pay | Admitting: Podiatry

## 2015-07-27 ENCOUNTER — Ambulatory Visit (INDEPENDENT_AMBULATORY_CARE_PROVIDER_SITE_OTHER): Payer: BLUE CROSS/BLUE SHIELD | Admitting: Podiatry

## 2015-07-27 VITALS — BP 141/83 | HR 95 | Resp 18

## 2015-07-27 DIAGNOSIS — M216X1 Other acquired deformities of right foot: Secondary | ICD-10-CM

## 2015-07-27 DIAGNOSIS — M2450 Contracture, unspecified joint: Secondary | ICD-10-CM

## 2015-07-27 DIAGNOSIS — M2041 Other hammer toe(s) (acquired), right foot: Secondary | ICD-10-CM

## 2015-07-27 DIAGNOSIS — Z9889 Other specified postprocedural states: Secondary | ICD-10-CM

## 2015-07-28 NOTE — Progress Notes (Signed)
Patient ID: Julia Holt, female   DOB: 10-06-1960, 55 y.o.   MRN: [id number]  Subjective: Julia Holt is a 55 y.o. is seen today in office s/p right 1st MTPJ mobilization, ROH, hammertoe repair 2-4 preformed on 07/22/15. They state their pain is improving. She has been nonweightbearing with the use of cam boot and crutches follow-up in some way to her foot. Also has been continuing with antibiotics. Her pain is controlled with current pain medicine. applications. Denies any systemic complaints such as fevers, chills, nausea, vomiting. No calf pain, chest pain, shortness of breath.   Objective: General: No acute distress, AAOx3  DP/PT pulses palpable 2/4, CRT < 3 sec to all digits.  Protective sensation intact. Motor function intact.  Right foot: Incision is well coapted without any evidence of dehiscence and sutures intact. There is no surrounding erythema, ascending cellulitis, fluctuance, crepitus, malodor, drainage/purulence. There is mild edema around the surgical site. There is mild pain along the surgical site. First MTPJ range of motion appears to be greatly improved compared to preoperatively. No other areas of tenderness to bilateral lower extremities.  No other open lesions or pre-ulcerative lesions.  No pain with calf compression, swelling, warmth, erythema.   Assessment and Plan:  Status post right foot surgery, doing well with no complications   -Treatment options discussed including all alternatives, risks, and complications -X-rays were obtained and reviewed with the patient.  -Ice/elevation -Pain medication as needed. -Antibiotic ointment was placed followed by dry sterile dressing. Keep dressing clean, dry, intact. -Continue nonweightbearing CAM boot -Aspirin daily -Monitor for any clinical signs or symptoms of infection and DVT/PE and directed to call the office immediately should any occur or go to the ER. -Follow-up in 1 week for possible suture removal or sooner if any  problems arise. In the meantime, encouraged to call the office with any questions, concerns, change in symptoms.   Celesta Gentile, DPM

## 2015-08-03 ENCOUNTER — Ambulatory Visit (INDEPENDENT_AMBULATORY_CARE_PROVIDER_SITE_OTHER): Payer: BLUE CROSS/BLUE SHIELD | Admitting: Podiatry

## 2015-08-03 ENCOUNTER — Encounter: Payer: Self-pay | Admitting: Podiatry

## 2015-08-03 DIAGNOSIS — Z9889 Other specified postprocedural states: Secondary | ICD-10-CM

## 2015-08-03 DIAGNOSIS — M216X1 Other acquired deformities of right foot: Secondary | ICD-10-CM | POA: Diagnosis not present

## 2015-08-03 DIAGNOSIS — M2041 Other hammer toe(s) (acquired), right foot: Secondary | ICD-10-CM

## 2015-08-05 NOTE — Progress Notes (Signed)
Patient ID: Julia Holt, female   DOB: Oct 01, 1960, 55 y.o.   MRN: [id number]   Subjective: Julia Holt is a 55 y.o. is seen today in office s/p right 1st MTPJ mobilization, ROH, hammertoe repair 2-4 preformed on 07/22/15. She states her pain is improving. She has been nonweightbearing with the use of cam boot and crutches follow-up in some way to her foot. Also she is finished with antibiotics. Her pain is controlled with current pain medicine. applications. Denies any systemic complaints such as fevers, chills, nausea, vomiting. No calf pain, chest pain, shortness of breath.   Objective: General: No acute distress, AAOx3  DP/PT pulses palpable 2/4, CRT < 3 sec to all digits.  Protective sensation intact. Motor function intact.  Right foot: Incision is well coapted without any evidence of dehiscence and sutures intact. There is no surrounding erythema, ascending cellulitis, fluctuance, crepitus, malodor, drainage/purulence. There is mild edema around the surgical site but improved. There is minimal pain along the surgical site. First MTPJ range of motion appears to be greatly improved compared to preoperatively. No other areas of tenderness to bilateral lower extremities.  No other open lesions or pre-ulcerative lesions.  No pain with calf compression, swelling, warmth, erythema.   Assessment and Plan:  Status post right foot surgery, doing well with no complications   -Treatment options discussed including all alternatives, risks, and complications -Sutures removed.  Antibiotic ointment was applied followed by a dressing. Keep the dressing clean, dry, intact. -Ice/elevation -Pain medication as needed. -ROM exercises for 1st MTPJ.  -Continue nonweightbearing CAM boot -Aspirin daily -Monitor for any clinical signs or symptoms of infection and DVT/PE and directed to call the office immediately should any occur or go to the ER. -Follow-up in as scheduled or sooner if any problems arise. In the  meantime, encouraged to call the office with any questions, concerns, change in symptoms.   Celesta Gentile, DPM

## 2015-08-14 ENCOUNTER — Ambulatory Visit: Payer: BLUE CROSS/BLUE SHIELD | Admitting: Podiatry

## 2015-08-17 ENCOUNTER — Encounter: Payer: Self-pay | Admitting: Podiatry

## 2015-08-17 ENCOUNTER — Ambulatory Visit (INDEPENDENT_AMBULATORY_CARE_PROVIDER_SITE_OTHER): Payer: BLUE CROSS/BLUE SHIELD | Admitting: Podiatry

## 2015-08-17 ENCOUNTER — Ambulatory Visit (INDEPENDENT_AMBULATORY_CARE_PROVIDER_SITE_OTHER): Payer: BLUE CROSS/BLUE SHIELD

## 2015-08-17 ENCOUNTER — Ambulatory Visit: Payer: BLUE CROSS/BLUE SHIELD

## 2015-08-17 VITALS — BP 135/75 | HR 70 | Resp 18

## 2015-08-17 DIAGNOSIS — Z9889 Other specified postprocedural states: Secondary | ICD-10-CM

## 2015-08-17 DIAGNOSIS — M2042 Other hammer toe(s) (acquired), left foot: Secondary | ICD-10-CM

## 2015-08-17 NOTE — Progress Notes (Signed)
Patient ID: Julia Holt, female   DOB: January 05, 1961, 55 y.o.   MRN: [id number]  Subjective: AMARRAH FAIN is a 55 y.o. is seen today in office s/p right 1st MTPJ mobilization, ROH, hammertoe repair 2-4 preformed on 07/22/15. She states her pain is improving and she is not taking any pain medication. She has continued to use the cam boot which she has been walking on. Denies any systemic complaints such as fevers, chills, nausea, vomiting. No calf pain, chest pain, shortness of breath.   Objective: General: No acute distress, AAOx3  DP/PT pulses palpable 2/4, CRT < 3 sec to all digits.  Protective sensation intact. Motor function intact.  Right foot: Incision is well coapted without any evidence of dehiscence and sutures intact. There is no surrounding erythema, ascending cellulitis, fluctuance, crepitus, malodor, drainage/purulence. There is decreased edema around the surgical site but improved. There is minimal pain along the surgical site. First MTPJ range of motion appears to be greatly improved compared to preoperatively. Toes sit in a rectus position. No other areas of tenderness to bilateral lower extremities.  No other open lesions or pre-ulcerative lesions.  No pain with calf compression, swelling, warmth, erythema.   Assessment and Plan:  Status post right foot surgery, doing well with no complications   -Treatment options discussed including all alternatives, risks, and complications -X-rays were obtained and reviewed with the patient. Hardware appears intact. The K wires and second toes has come out some but still in the position. -Antibiotic ointment was applied followed by a dressing. Keep the dressing clean, dry, intact. -Ice/elevation -Pain medication as needed. -ROM exercises for 1st MTPJ.  -Weightbearing as tolerated in a cam boot. -Monitor for any clinical signs or symptoms of infection and DVT/PE and directed to call the office immediately should any occur or go to the  ER. -Follow-up in as scheduled or sooner if any problems arise. In the meantime, encouraged to call the office with any questions, concerns, change in symptoms.  *x-ray and pin removal next appointment  Celesta Gentile, DPM

## 2015-08-28 NOTE — Progress Notes (Signed)
Patient ID: Julia Holt, female   DOB: 1960/08/18, 55 y.o.   MRN: [id number] Dr Jacqualyn Posey performed a Right big toe joint release, hammertoe repair of toes 2,3,4, metatarsal osteotomy of 2nd with use of pins/screw

## 2015-09-02 ENCOUNTER — Ambulatory Visit (INDEPENDENT_AMBULATORY_CARE_PROVIDER_SITE_OTHER): Payer: BLUE CROSS/BLUE SHIELD | Admitting: Podiatry

## 2015-09-02 ENCOUNTER — Encounter: Payer: Self-pay | Admitting: Podiatry

## 2015-09-02 ENCOUNTER — Ambulatory Visit (HOSPITAL_BASED_OUTPATIENT_CLINIC_OR_DEPARTMENT_OTHER)
Admission: RE | Admit: 2015-09-02 | Discharge: 2015-09-02 | Disposition: A | Discharge status: Home / Self Care | Payer: BLUE CROSS/BLUE SHIELD | Source: Ambulatory Visit | Attending: Podiatry | Admitting: Podiatry

## 2015-09-02 VITALS — BP 130/83 | HR 88 | Resp 18

## 2015-09-02 DIAGNOSIS — M2041 Other hammer toe(s) (acquired), right foot: Secondary | ICD-10-CM | POA: Insufficient documentation

## 2015-09-02 DIAGNOSIS — M216X1 Other acquired deformities of right foot: Secondary | ICD-10-CM | POA: Diagnosis not present

## 2015-09-02 DIAGNOSIS — Z9889 Other specified postprocedural states: Secondary | ICD-10-CM

## 2015-09-02 DIAGNOSIS — M2042 Other hammer toe(s) (acquired), left foot: Secondary | ICD-10-CM | POA: Diagnosis not present

## 2015-09-02 DIAGNOSIS — M79673 Pain in unspecified foot: Secondary | ICD-10-CM

## 2015-09-02 NOTE — Progress Notes (Signed)
Patient ID: Julia Holt, female   DOB: [birth year], 55 y.o.   MRN: [id number]  Subjective: Julia Holt is a 55 y.o. is seen today in office s/p right 1st MTPJ mobilization, ROH, hammertoe repair 2-4 preformed on 07/22/15. Continue the cam boot. She says overall she is doing well she is not having pain and weak taking any pain medicine.Denies any systemic complaints such as fevers, chills, nausea, vomiting. No calf pain, chest pain, shortness of breath.   Objective: General: No acute distress, AAOx3  DP/PT pulses palpable 2/4, CRT < 3 sec to all digits.  Protective sensation intact. Motor function intact.  Right foot: Incision is well coapted without any evidence of dehiscence and a scar has formed. There is no surrounding erythema, ascending cellulitis, fluctuance, crepitus, malodor, drainage/purulence. There is decreased edema around the surgical site but improved. There is minimal pain along the surgical site. First MTPJ range of motion appears to be greatly improved compared to preoperatively and the toe sits in a better position. Toes sit in a rectus position. No other areas of tenderness to bilateral lower extremities.  No other open lesions or pre-ulcerative lesions.  No pain with calf compression, swelling, warmth, erythema.   Assessment and Plan:  Status post right foot surgery, doing well with no complications   -Treatment options discussed including all alternatives, risks, and complications -X-rays were ordered and reviewed. -K wires were cleaned and they were removed in total today without complications. In about equivalent was applied followed by dressing. -Transition to Darco shoe with the Darco splint. -Compression sock Spence. -Ice and elevation -Weightbearing as tolerated -Monitor for any clinical signs or symptoms of infection and DVT/PE and directed to call the office immediately should any occur or go to the ER. -Follow-up in as scheduled or sooner if any problems arise.  In the meantime, encouraged to call the office with any questions, concerns, change in symptoms.  *x-ray next appointment.   Celesta Gentile, DPM

## 2015-09-04 ENCOUNTER — Telehealth: Payer: Self-pay | Admitting: *Deleted

## 2015-09-04 NOTE — Telephone Encounter (Addendum)
Pt states she is 6 weeks post-op and had her pins removed 09/02/2015 at [facility] and given a sleeve and a surgical shoe, her toes are swollen and the last 2 toes are red, is this normal an should she go back into the boot.  Left message for pt to call.  Pt returned my call, states her 4th toe where the pin was remove has a pin point scab, but she has a small pink area on the 4th toe at the side.  I asked the pt if her toes were a healthy pink and warm or were they red, hot or pale and cold.  Pt states they were healthy pink and warm.  I asked pt if she was putting the elastic sock on 1st thing in the morning or was she sleeping in it.  Pt states she is putting it on 1st thing in the morning.  I told pt it sounded like her surgery foot was just trying to cope with the day to day swelling and surgery swelling and a change of position with the toes due to the removal of the pins and beginning the surgical shoe.  I told pt to go back in to the surgical boot for at least over the weekend, continue the elastic sock as she was and call if any concerns.  Pt states understanding.  I informed Dr. Jacqualyn Posey and he agreed.  09/14/2015- Pt states she is able to bend the big toe joint well but the 2,3,4 toes don't bend.  Pt asked if they ever will bend.  Dr. Jacqualyn Posey states the lesser toes will only bend at the ball of the foot. I informed pt, and she states understanding.

## 2015-09-23 ENCOUNTER — Ambulatory Visit (INDEPENDENT_AMBULATORY_CARE_PROVIDER_SITE_OTHER): Payer: BLUE CROSS/BLUE SHIELD | Admitting: Podiatry

## 2015-09-23 ENCOUNTER — Encounter: Payer: Self-pay | Admitting: Podiatry

## 2015-09-23 ENCOUNTER — Ambulatory Visit (HOSPITAL_BASED_OUTPATIENT_CLINIC_OR_DEPARTMENT_OTHER)
Admission: RE | Admit: 2015-09-23 | Discharge: 2015-09-23 | Disposition: A | Discharge status: Home / Self Care | Payer: BLUE CROSS/BLUE SHIELD | Source: Ambulatory Visit | Attending: Podiatry | Admitting: Podiatry

## 2015-09-23 DIAGNOSIS — L84 Corns and callosities: Secondary | ICD-10-CM | POA: Diagnosis not present

## 2015-09-23 DIAGNOSIS — R937 Abnormal findings on diagnostic imaging of other parts of musculoskeletal system: Secondary | ICD-10-CM | POA: Diagnosis not present

## 2015-09-23 DIAGNOSIS — M7989 Other specified soft tissue disorders: Secondary | ICD-10-CM

## 2015-09-23 DIAGNOSIS — Z9889 Other specified postprocedural states: Secondary | ICD-10-CM | POA: Insufficient documentation

## 2015-09-23 NOTE — Progress Notes (Signed)
Patient ID: Julia Holt, female   DOB: [birth year], 55 y.o.   MRN: [id number]  Subjective: Julia Holt is a 55 y.o. is seen today in office s/p right 1st MTPJ mobilization, ROH, hammertoe repair 2-4 preformed on 07/22/15. She's transition to regular shoe and she has been doing well. She gets some occasional swelling to her toes mostly around the fourth toe. Denies any redness or warmth. No tingling or numbness. She's been doing range of motion exercises of the first big toe joint she feels it is moving better. She states that she is pleased with the outcome of the surgery. No other complaints at this time. Denies any systemic complaints such as fevers, chills, nausea, vomiting. No calf pain, chest pain, shortness of breath.   Objective: General: No acute distress, AAOx3  DP/PT pulses palpable 2/4, CRT < 3 sec to all digits.  Protective sensation intact. Motor function intact.  Right foot: Incision is well coapted without any evidence of dehiscence and scars have formed. There is no surrounding erythema, ascending cellulitis, fluctuance, crepitus, malodor, drainage/purulence. There is minimal edema to the toes. There is no erythema or increase in warmth. The toes sit in rectus position. Hyperkeratotic lesion left foot second metatarsal 5. Upon debridement no underlying ulceration, drainage or other signs of infection. No other areas of tenderness to bilateral lower extremities.  No other open lesions or pre-ulcerative lesions.  No pain with calf compression, swelling, warmth, erythema.   Assessment and Plan:  Status post right foot surgery, doing well with no complications   -Treatment options discussed including all alternatives, risks, and complications -X-rays were ordered and reviewed. -Today with range of motion exercises for both the first and second MTPJ. Recommended to use cocoa butter or vitamin E cream over the incisions daily. -Continue regular shoe gear. Continue to increase activity.  There is any increase in pain to decrease. -Ice and elevation -Showed her how to tape the toes to help with swelling. -Hyperkeratotic lesion debrided 1 without complications or bleeding. -Monitor for any clinical signs or symptoms of infection and DVT/PE and directed to call the office immediately should any occur or go to the ER. -Follow-up in 6 weeks or sooner if any problems arise. In the meantime, encouraged to call the office with any questions, concerns, change in symptoms.  *x-ray next appointment.   Celesta Gentile, DPM

## 2015-10-12 ENCOUNTER — Telehealth: Payer: Self-pay | Admitting: *Deleted

## 2015-10-12 NOTE — Telephone Encounter (Signed)
Pt states she can't find the tape Dr. Jacqualyn Posey used to push the fluid out of her toe.  Left message informing pt often [facility] had the Coflex, or Coban in the first aid section or she could purchase from our office.

## 2015-10-13 ENCOUNTER — Other Ambulatory Visit: Payer: Self-pay

## 2015-10-13 DIAGNOSIS — Z1231 Encounter for screening mammogram for malignant neoplasm of breast: Secondary | ICD-10-CM

## 2015-10-16 ENCOUNTER — Telehealth: Payer: Self-pay | Admitting: *Deleted

## 2015-10-16 NOTE — Telephone Encounter (Addendum)
Pt states all of her toes lay down except her 2nd and she is performing the exercises as ordered, is this normal?  I left message informing pt that not every pt's recovery was the same, but it was not unusual to have the toe remain raised for a period of time after the surgery due to swelling in the toe and in the joint, that may not be noticeable to the pt, but if she was concerned to remember to ask this question of the doctor at the next visit. 11/27/2015-Pt states Dr. Jacqualyn Posey had offered PT for the right foot, and she had refused, but would like it now.  Dr. Jacqualyn Posey ordered PT [facility], evaluated and treat, focusing on increase range of motion, decrease pain and swelling. 11/30/2015-Left message informing pt that Dr. Jacqualyn Posey was referring pt to [facility] for PT, and left the appt line phone. 12/08/2015-Pt states she called for an appt at [facility] on [address] and they said they did not have the PTrx.  I called pt and informed her, had confirmation they had received the PT rx on 11/30/2015. Faxed to [facility].

## 2015-11-04 ENCOUNTER — Encounter: Payer: Self-pay | Admitting: Podiatry

## 2015-11-04 ENCOUNTER — Ambulatory Visit (HOSPITAL_BASED_OUTPATIENT_CLINIC_OR_DEPARTMENT_OTHER)
Admission: RE | Admit: 2015-11-04 | Discharge: 2015-11-04 | Disposition: A | Discharge status: Home / Self Care | Payer: BLUE CROSS/BLUE SHIELD | Source: Ambulatory Visit | Attending: Podiatry | Admitting: Podiatry

## 2015-11-04 ENCOUNTER — Ambulatory Visit (INDEPENDENT_AMBULATORY_CARE_PROVIDER_SITE_OTHER): Payer: BLUE CROSS/BLUE SHIELD | Admitting: Podiatry

## 2015-11-04 DIAGNOSIS — M216X1 Other acquired deformities of right foot: Secondary | ICD-10-CM

## 2015-11-04 DIAGNOSIS — Z9889 Other specified postprocedural states: Secondary | ICD-10-CM | POA: Diagnosis not present

## 2015-11-04 DIAGNOSIS — Z981 Arthrodesis status: Secondary | ICD-10-CM | POA: Diagnosis not present

## 2015-11-04 DIAGNOSIS — M2042 Other hammer toe(s) (acquired), left foot: Secondary | ICD-10-CM

## 2015-11-05 NOTE — Progress Notes (Signed)
Patient ID: Julia Holt, female   DOB: 10-31-1960, 55 y.o.   MRN: [id number]  Subjective: Julia Holt is a 55 y.o. is seen today in office s/p right 1st MTPJ mobilization, ROH, hammertoe repair 2-4 preformed on 07/22/15. She's been unable to wear regular shoe without any difficulty. She does continue range of motion exercises for the MPJs as well as using wish tries over the scars. She does have the range of motion has improved particular the first MPJ. The second toe does still sit slightly off the ground for this has improved compared to last appointment. No recent injury or trauma. His been no other complaints no acute changes since last appointment.  Objective: General: No acute distress, AAOx3  DP/PT pulses palpable 2/4, CRT < 3 sec to all digits.  Protective sensation intact. Motor function intact.  Right foot: Incision is well coapted without any evidence of dehiscence and scars have formed. There is no surrounding erythema, ascending cellulitis, fluctuance, crepitus, malodor, drainage/purulence. There is no significant edema to the toes. There is no erythema or increase in warmth. The toes sit in rectus position of the second toe does sit slightly elevated off the ground. There is slight limitation of motion of the second MPJ however this does appear to be improving. No other areas of tenderness to bilateral lower extremities.  No other open lesions or pre-ulcerative lesions.  No pain with calf compression, swelling, warmth, erythema.   Assessment and Plan:  Status post right foot surgery, doing well with no complications   -Treatment options discussed including all alternatives, risks, and complications -Recommended continue supportive shoe gear. Continue range of motion exercises. Discussed physical therapy but she declined. Moisturizer of the scar daily. Discussed scar cream but she wishes to hold off on that for now. Ice and compression as needed. Follow-up with me in the next 6-8  weeks if symptoms continue or sooner if any issues are to arise.  Celesta Gentile, DPM

## 2015-11-30 ENCOUNTER — Telehealth: Payer: Self-pay | Admitting: *Deleted

## 2015-11-30 NOTE — Telephone Encounter (Signed)
Enter in error

## 2015-12-08 ENCOUNTER — Telehealth: Payer: Self-pay | Admitting: *Deleted

## 2015-12-08 NOTE — Telephone Encounter (Signed)
Entered in error

## 2015-12-18 ENCOUNTER — Ambulatory Visit: Payer: BLUE CROSS/BLUE SHIELD

## 2016-03-16 IMAGING — US US EXTREM LOW VENOUS*L*
1 series · 13 of 24 positions shown · non-contrast
Comparison: None

CLINICAL DATA: LEFT lateral calf pain worse in mid calf, pain with
walking, elevated D-dimer, recent diagnosis of venous dermatitis,
history of DVT, diabetes, hypertension



[Series 1: us extrem low venous*left* · 0.08mm/px · 13 of 41 slices shown]
[im 1/41]
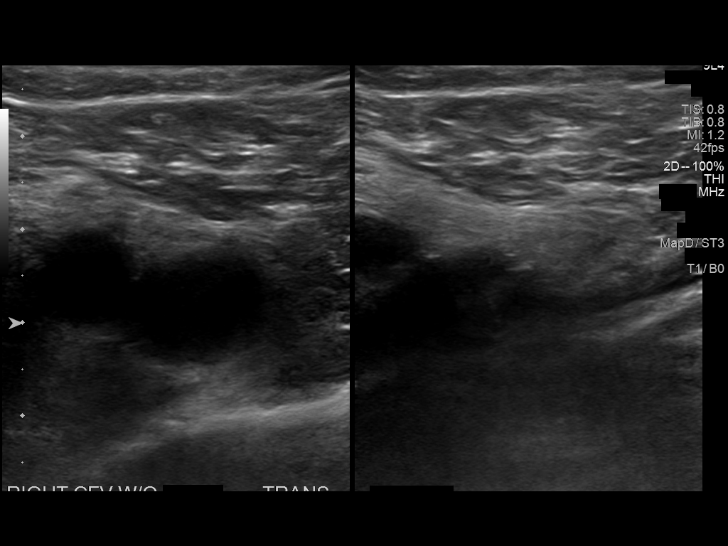
[im 4/41]
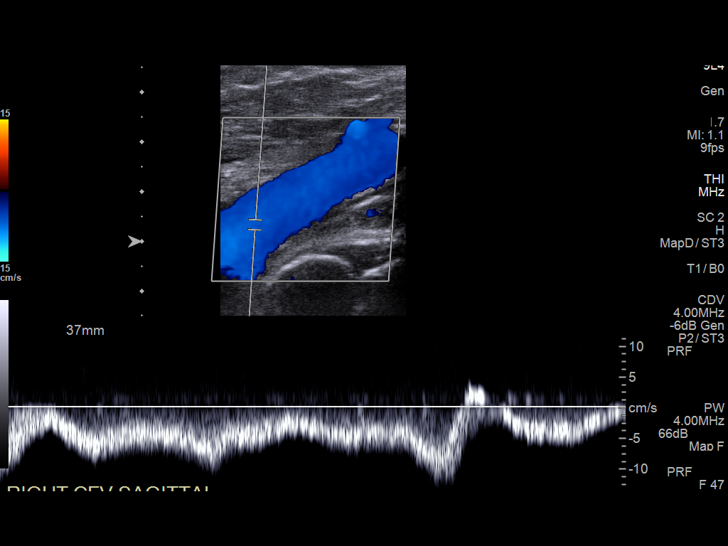
[im 7/41]
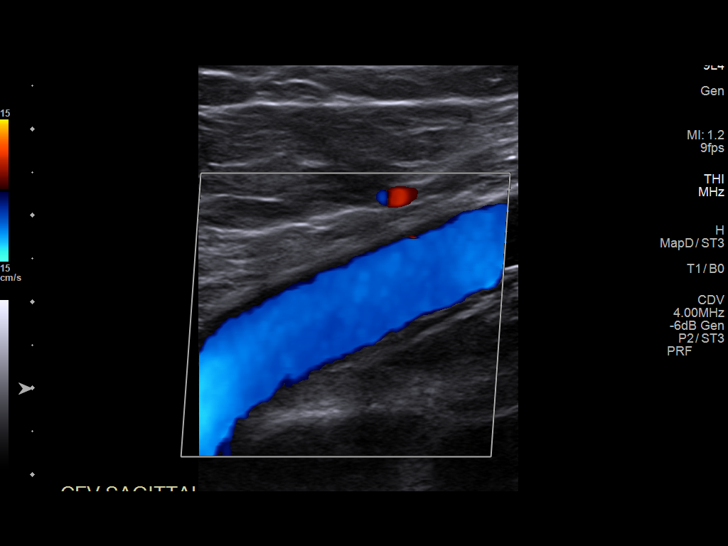
[im 11/41]
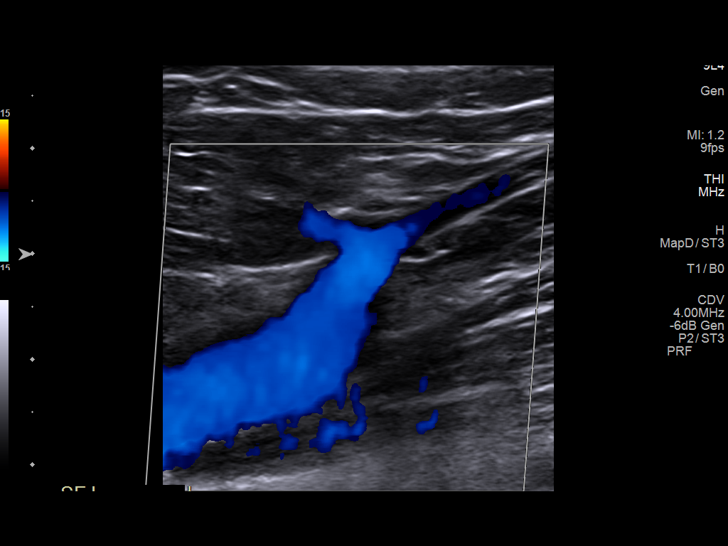
[im 14/41]
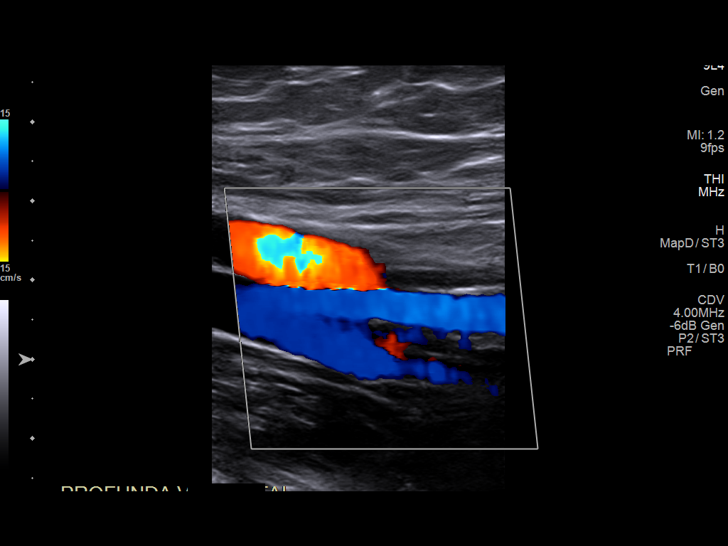
[im 18/41]
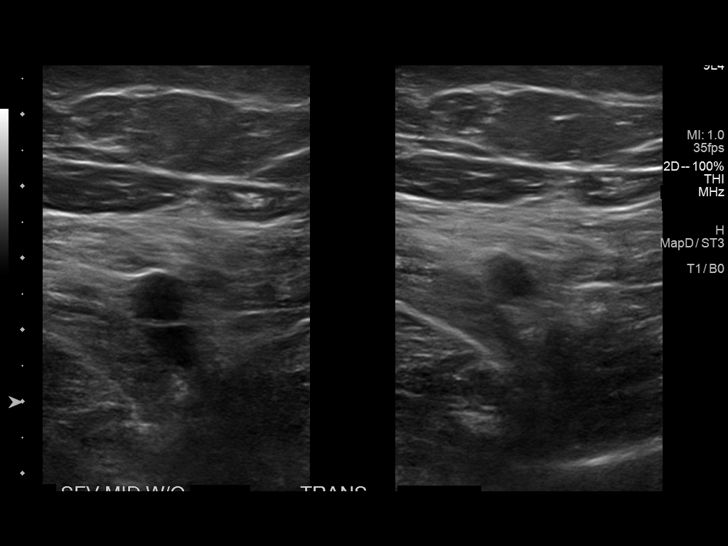
[im 21/41]
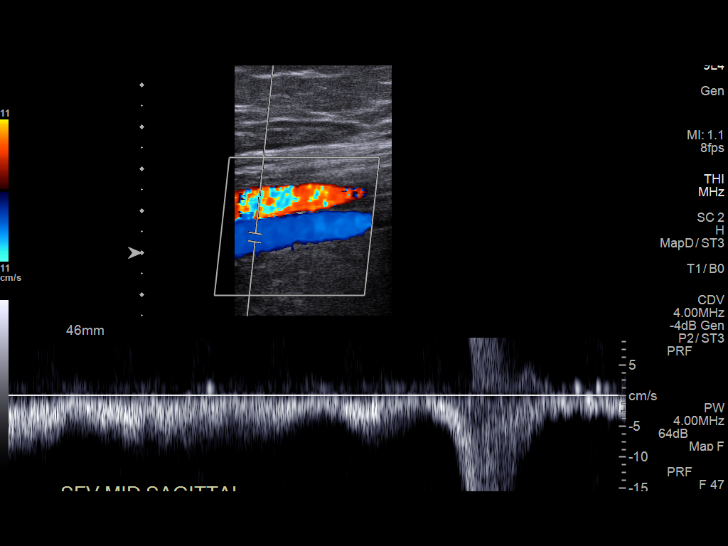
[im 23/41]
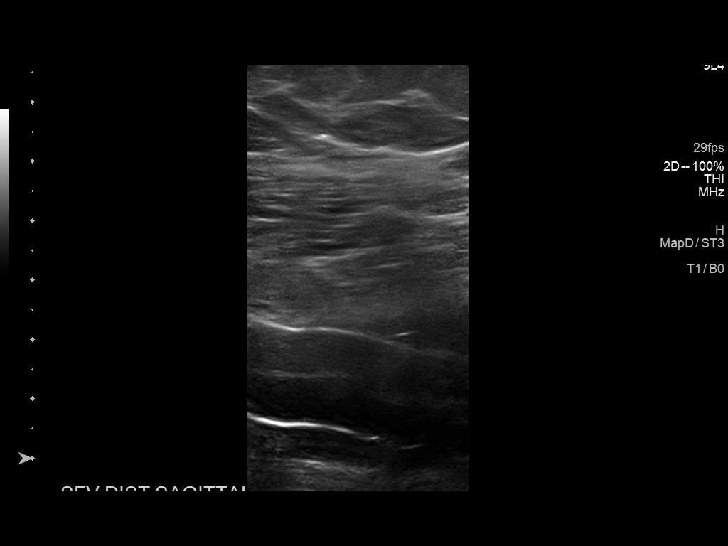
[im 27/41]
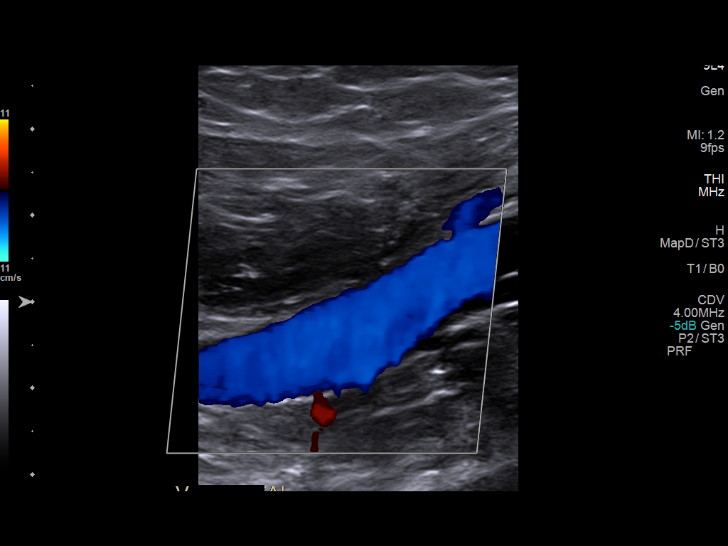
[im 30/41]
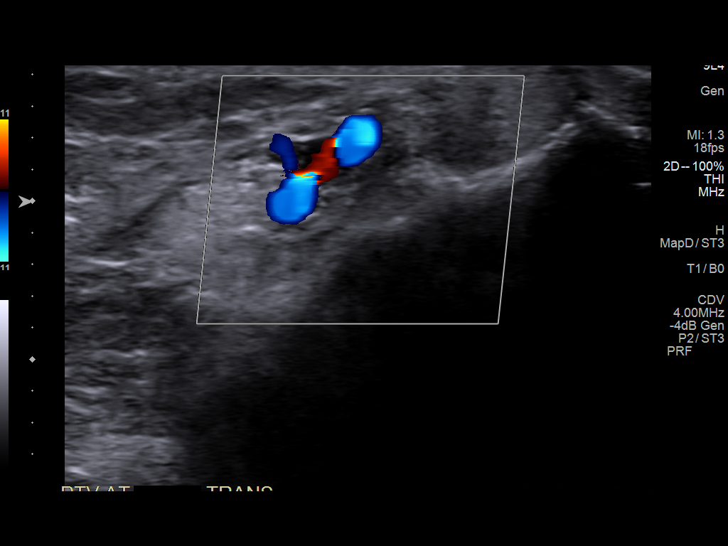
[im 34/41]
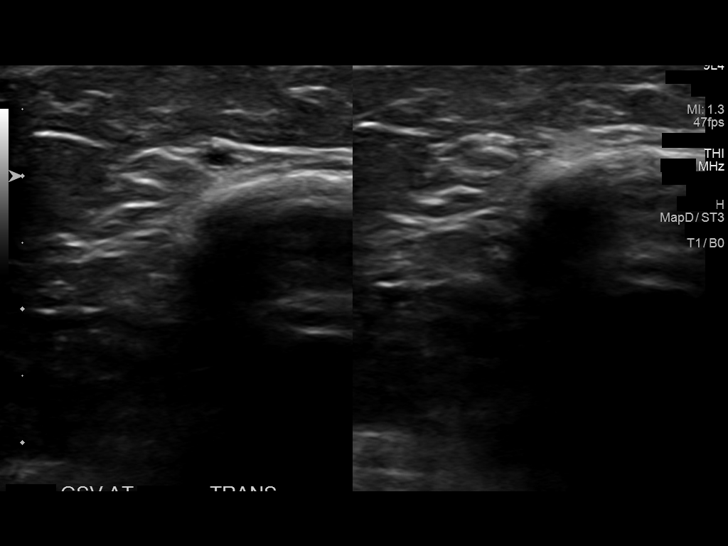
[im 37/41]
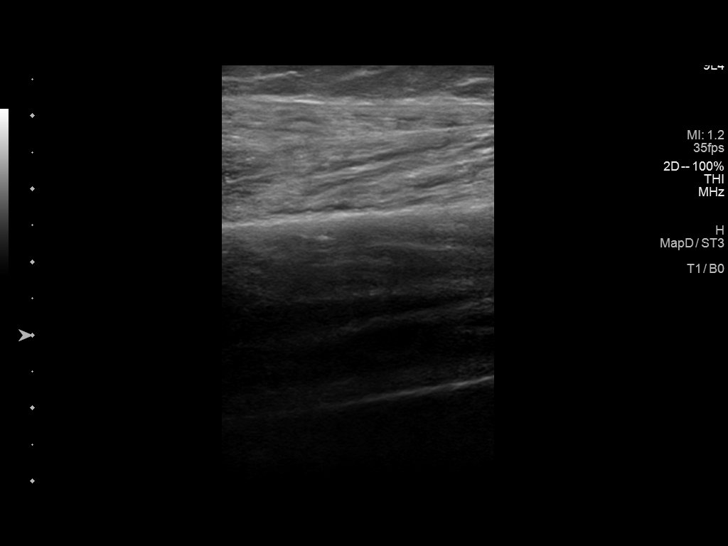
[im 41/41]
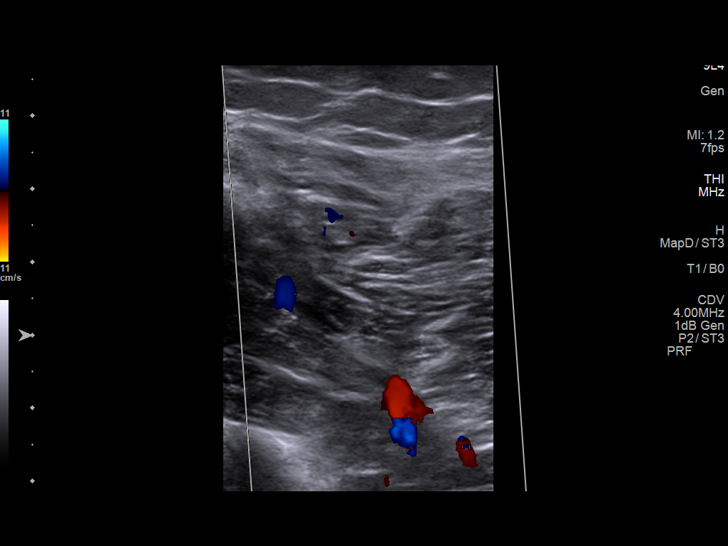

[13 of 24 positions shown; findings below may reference images not displayed]

FINDINGS: Contralateral Common Femoral Vein: Respiratory phasicity is normal
and symmetric with the symptomatic side. No evidence of thrombus.
Normal compressibility.

Common Femoral Vein: No evidence of thrombus. Normal
compressibility, respiratory phasicity and response to augmentation.

Saphenofemoral Junction: No evidence of thrombus. Normal
compressibility and flow on color Doppler imaging.

Profunda Femoral Vein: No evidence of thrombus. Normal
compressibility and flow on color Doppler imaging.

Femoral Vein: No evidence of thrombus. Normal compressibility,
respiratory phasicity and response to augmentation.

Popliteal Vein: No evidence of thrombus. Normal compressibility,
respiratory phasicity and response to augmentation.

Calf Veins: No evidence of thrombus. Normal compressibility and flow
on color Doppler imaging.

Superficial Great Saphenous Vein: No evidence of thrombus. Normal
compressibility and flow on color Doppler imaging.

Venous Reflux:  None.

Other Findings:  None.
IMPRESSION: No evidence of deep venous thrombosis in the LEFT lower extremity.

## 2016-04-19 ENCOUNTER — Ambulatory Visit (HOSPITAL_BASED_OUTPATIENT_CLINIC_OR_DEPARTMENT_OTHER)
Admission: RE | Admit: 2016-04-19 | Discharge: 2016-04-19 | Disposition: A | Discharge status: Home / Self Care | Payer: BLUE CROSS/BLUE SHIELD | Source: Ambulatory Visit | Attending: Podiatry | Admitting: Podiatry

## 2016-04-19 ENCOUNTER — Ambulatory Visit (INDEPENDENT_AMBULATORY_CARE_PROVIDER_SITE_OTHER): Payer: BLUE CROSS/BLUE SHIELD | Admitting: Podiatry

## 2016-04-19 ENCOUNTER — Encounter: Payer: Self-pay | Admitting: Podiatry

## 2016-04-19 DIAGNOSIS — Z09 Encounter for follow-up examination after completed treatment for conditions other than malignant neoplasm: Secondary | ICD-10-CM | POA: Insufficient documentation

## 2016-04-19 DIAGNOSIS — M19071 Primary osteoarthritis, right ankle and foot: Secondary | ICD-10-CM | POA: Insufficient documentation

## 2016-04-19 DIAGNOSIS — M2580 Other specified joint disorders, unspecified joint: Secondary | ICD-10-CM

## 2016-04-19 NOTE — Progress Notes (Signed)
Subjective: 55 year old female presents the office today for concerns of the painful knot on the bottom of her right foot underneath the big toe. She said that she has pain when she tries to walk on hard surfaces. She states that is his been worsening over the last several weeks. She states that her big toe stenosis off the ground some since her bunion surgery. She denies any recent injury or trauma. No redness or swelling that she has noted. Denies any systemic complaints such as fevers, chills, nausea, vomiting. No acute changes since last appointment, and no other complaints at this time.   Objective: AAO x3, NAD DP/PT pulses palpable bilaterally, CRT less than 3 seconds There is plantarflexion of the first metatarsal and there is prominence of the sesamoids plantarly on the right foot. There is tenderness on the medial and lateral sesamoid. There is a small amount of fluid present submetatarsal 1. No fluctuance or crepitus. There is restriction first MTPJ range of motion in plantarflexion however there is adequate dorsiflexion. The remainder the toes sit in rectus position. The fourth toe however does sit in a slightly adducted position and there is trace edema but there is no tenderness of the fourth toe at this time. No open lesions or pre-ulcerative lesions.  No pain with calf compression, swelling, warmth, erythema  Assessment: Plantar flexion first ray with prominent sesamoid  Plan: -All treatment options discussed with the patient including all alternatives, risks, complications.  -X-rays ordered and reviewed -A modified her orthotics a more pressure off the submetatarsal 1, sesamoid area. If this does not help I'll have her bring the inserts in the next couple weeks and I'll send him back for modifications to include a first ray cut. -Declined steroid injection today.  -Continue offloading pads/splints her toes as this has been helping. Follow-up next 4-6 weeks if symptoms continue or  sooner if needed.- -Patient encouraged to call the office with any questions, concerns, change in symptoms.   Julia Holt, DPM

## 2016-05-17 ENCOUNTER — Ambulatory Visit (INDEPENDENT_AMBULATORY_CARE_PROVIDER_SITE_OTHER): Payer: BLUE CROSS/BLUE SHIELD | Admitting: Podiatry

## 2016-05-17 ENCOUNTER — Encounter: Payer: Self-pay | Admitting: Podiatry

## 2016-05-17 DIAGNOSIS — Q828 Other specified congenital malformations of skin: Secondary | ICD-10-CM

## 2016-05-17 NOTE — Progress Notes (Signed)
Subjective: Patient presents to the office today for calluses to both of her feet that become painful with pressure and shoes. No redness, drainage, swelling. Denies any systemic complaints such as fevers, chills, nausea, vomiting. No acute changes since last appointment, and no other complaints at this time.   Objective: AAO x3, NAD DP/PT pulses palpable bilaterally, CRT less than 3 seconds Bilateral submetatarsal 5 hyperkerotic lesions. Upon debridement no underlying ulceration, drainage, or signs of infection. Prominence of the metatarsal heads bilaterally.   No other areas of tenderness bilaterally.  No open lesions or pre-ulcerative lesions.  No pain with calf compression, swelling, warmth, erythema  Assessment: Submet 5 callus bilaterally  Plan: -All treatment options discussed with the patient including all alternatives, risks, complications.  -Hypkerkerotic lesions debrided x 2 without complications or bleeding.  -Modified orthotics to offload the calluses -Follow-up as needed.  -Patient encouraged to call the office with any questions, concerns, change in symptoms.   Celesta Gentile, DPM

## 2016-06-02 ENCOUNTER — Emergency Department (HOSPITAL_COMMUNITY): Payer: BLUE CROSS/BLUE SHIELD

## 2016-06-02 ENCOUNTER — Inpatient Hospital Stay (HOSPITAL_COMMUNITY): Payer: BLUE CROSS/BLUE SHIELD

## 2016-06-02 ENCOUNTER — Inpatient Hospital Stay (HOSPITAL_COMMUNITY)
Admission: EM | Admit: 2016-06-02 | Discharge: 2016-06-06 | DRG: 637 | Disposition: A | Discharge status: Home / Self Care | Payer: BLUE CROSS/BLUE SHIELD | Attending: Family Medicine | Admitting: Family Medicine

## 2016-06-02 ENCOUNTER — Encounter (HOSPITAL_COMMUNITY): Payer: Self-pay | Admitting: Emergency Medicine

## 2016-06-02 DIAGNOSIS — N179 Acute kidney failure, unspecified: Secondary | ICD-10-CM | POA: Diagnosis present

## 2016-06-02 DIAGNOSIS — E1110 Type 2 diabetes mellitus with ketoacidosis without coma: Secondary | ICD-10-CM | POA: Diagnosis present

## 2016-06-02 DIAGNOSIS — E669 Obesity, unspecified: Secondary | ICD-10-CM | POA: Diagnosis present

## 2016-06-02 DIAGNOSIS — K760 Fatty (change of) liver, not elsewhere classified: Secondary | ICD-10-CM | POA: Diagnosis present

## 2016-06-02 DIAGNOSIS — Z794 Long term (current) use of insulin: Secondary | ICD-10-CM

## 2016-06-02 DIAGNOSIS — Z8249 Family history of ischemic heart disease and other diseases of the circulatory system: Secondary | ICD-10-CM | POA: Diagnosis not present

## 2016-06-02 DIAGNOSIS — R740 Nonspecific elevation of levels of transaminase and lactic acid dehydrogenase [LDH]: Secondary | ICD-10-CM | POA: Diagnosis not present

## 2016-06-02 DIAGNOSIS — Z9641 Presence of insulin pump (external) (internal): Secondary | ICD-10-CM | POA: Diagnosis present

## 2016-06-02 DIAGNOSIS — Z881 Allergy status to other antibiotic agents status: Secondary | ICD-10-CM | POA: Diagnosis not present

## 2016-06-02 DIAGNOSIS — E872 Acidosis, unspecified: Secondary | ICD-10-CM

## 2016-06-02 DIAGNOSIS — A4282 Actinomycotic encephalitis: Secondary | ICD-10-CM | POA: Diagnosis not present

## 2016-06-02 DIAGNOSIS — E039 Hypothyroidism, unspecified: Secondary | ICD-10-CM | POA: Diagnosis present

## 2016-06-02 DIAGNOSIS — J9601 Acute respiratory failure with hypoxia: Secondary | ICD-10-CM

## 2016-06-02 DIAGNOSIS — J189 Pneumonia, unspecified organism: Secondary | ICD-10-CM | POA: Diagnosis present

## 2016-06-02 DIAGNOSIS — Z6835 Body mass index (BMI) 35.0-35.9, adult: Secondary | ICD-10-CM

## 2016-06-02 DIAGNOSIS — G9341 Metabolic encephalopathy: Secondary | ICD-10-CM | POA: Diagnosis present

## 2016-06-02 DIAGNOSIS — Z978 Presence of other specified devices: Secondary | ICD-10-CM

## 2016-06-02 DIAGNOSIS — J9690 Respiratory failure, unspecified, unspecified whether with hypoxia or hypercapnia: Secondary | ICD-10-CM | POA: Diagnosis not present

## 2016-06-02 DIAGNOSIS — I10 Essential (primary) hypertension: Secondary | ICD-10-CM | POA: Diagnosis present

## 2016-06-02 DIAGNOSIS — E876 Hypokalemia: Secondary | ICD-10-CM | POA: Diagnosis present

## 2016-06-02 DIAGNOSIS — Z4659 Encounter for fitting and adjustment of other gastrointestinal appliance and device: Secondary | ICD-10-CM

## 2016-06-02 DIAGNOSIS — E871 Hypo-osmolality and hyponatremia: Secondary | ICD-10-CM | POA: Diagnosis present

## 2016-06-02 DIAGNOSIS — E1010 Type 1 diabetes mellitus with ketoacidosis without coma: Secondary | ICD-10-CM | POA: Diagnosis not present

## 2016-06-02 DIAGNOSIS — I959 Hypotension, unspecified: Secondary | ICD-10-CM | POA: Diagnosis present

## 2016-06-02 DIAGNOSIS — R571 Hypovolemic shock: Secondary | ICD-10-CM | POA: Diagnosis not present

## 2016-06-02 DIAGNOSIS — Z79899 Other long term (current) drug therapy: Secondary | ICD-10-CM | POA: Diagnosis not present

## 2016-06-02 DIAGNOSIS — E1101 Type 2 diabetes mellitus with hyperosmolarity with coma: Secondary | ICD-10-CM | POA: Diagnosis not present

## 2016-06-02 LAB — URINALYSIS, ROUTINE W REFLEX MICROSCOPIC
BACTERIA UA: NONE SEEN
Bilirubin Urine: NEGATIVE
Ketones, ur: 20 mg/dL — AB
LEUKOCYTES UA: NEGATIVE
Nitrite: NEGATIVE
PROTEIN: NEGATIVE mg/dL
SQUAMOUS EPITHELIAL / LPF: NONE SEEN
Specific Gravity, Urine: 1.016 (ref 1.005–1.030)
pH: 5 (ref 5.0–8.0)

## 2016-06-02 LAB — COMPREHENSIVE METABOLIC PANEL
ALBUMIN: 3.5 g/dL (ref 3.5–5.0)
ALK PHOS: 91 U/L (ref 38–126)
ALT: 34 U/L (ref 14–54)
ANION GAP: 21 — AB (ref 5–15)
AST: 38 U/L (ref 15–41)
BILIRUBIN TOTAL: 1 mg/dL (ref 0.3–1.2)
BUN: 46 mg/dL — ABNORMAL HIGH (ref 6–20)
CALCIUM: 8.6 mg/dL — AB (ref 8.9–10.3)
CO2: 12 mmol/L — ABNORMAL LOW (ref 22–32)
CREATININE: 2.41 mg/dL — AB (ref 0.44–1.00)
Chloride: 98 mmol/L — ABNORMAL LOW (ref 101–111)
GFR calc non Af Amer: 22 mL/min — ABNORMAL LOW (ref >60)
GFR, EST AFRICAN AMERICAN: 25 mL/min — AB (ref >60)
GLUCOSE: 878 mg/dL — AB (ref 65–99)
Potassium: 5 mmol/L (ref 3.5–5.1)
Sodium: 131 mmol/L — ABNORMAL LOW (ref 135–145)
Total Protein: 5.8 g/dL — ABNORMAL LOW (ref 6.5–8.1)

## 2016-06-02 LAB — BLOOD GAS, ARTERIAL
ACID-BASE DEFICIT: 2.6 mmol/L — AB (ref 0.0–2.0)
Bicarbonate: 20 mmol/L (ref 20.0–28.0)
DRAWN BY: 46203
FIO2: 50
MECHVT: 470 mL
O2 SAT: 99.3 %
PCO2 ART: 25.4 mmHg — AB (ref 32.0–48.0)
PEEP/CPAP: 5 cmH2O
PH ART: 7.508 — AB (ref 7.350–7.450)
PO2 ART: 182 mmHg — AB (ref 83.0–108.0)
Patient temperature: 98.6
RATE: 28 resp/min

## 2016-06-02 LAB — I-STAT ARTERIAL BLOOD GAS, ED
ACID-BASE DEFICIT: 12 mmol/L — AB (ref 0.0–2.0)
Bicarbonate: 14.2 mmol/L — ABNORMAL LOW (ref 20.0–28.0)
O2 SAT: 100 %
PO2 ART: 240 mmHg — AB (ref 83.0–108.0)
Patient temperature: 98.6
TCO2: 15 mmol/L (ref 0–100)
pCO2 arterial: 34.3 mmHg (ref 32.0–48.0)
pH, Arterial: 7.225 — ABNORMAL LOW (ref 7.350–7.450)

## 2016-06-02 LAB — I-STAT VENOUS BLOOD GAS, ED
Acid-base deficit: 13 mmol/L — ABNORMAL HIGH (ref 0.0–2.0)
BICARBONATE: 12.2 mmol/L — AB (ref 20.0–28.0)
O2 Saturation: 90 %
PH VEN: 7.263 (ref 7.250–7.430)
PO2 VEN: 65 mmHg — AB (ref 32.0–45.0)
TCO2: 13 mmol/L (ref 0–100)
pCO2, Ven: 27 mmHg — ABNORMAL LOW (ref 44.0–60.0)

## 2016-06-02 LAB — BASIC METABOLIC PANEL
Anion gap: 17 — ABNORMAL HIGH (ref 5–15)
BUN: 45 mg/dL — AB (ref 6–20)
CO2: 14 mmol/L — ABNORMAL LOW (ref 22–32)
CREATININE: 2.41 mg/dL — AB (ref 0.44–1.00)
Calcium: 8.1 mg/dL — ABNORMAL LOW (ref 8.9–10.3)
Chloride: 108 mmol/L (ref 101–111)
GFR, EST AFRICAN AMERICAN: 25 mL/min — AB (ref >60)
GFR, EST NON AFRICAN AMERICAN: 22 mL/min — AB (ref >60)
Glucose, Bld: 584 mg/dL (ref 65–99)
POTASSIUM: 3.7 mmol/L (ref 3.5–5.1)
SODIUM: 139 mmol/L (ref 135–145)

## 2016-06-02 LAB — CBC WITH DIFFERENTIAL/PLATELET
BASOS PCT: 0 %
Basophils Absolute: 0 10*3/uL (ref 0.0–0.1)
EOS ABS: 0 10*3/uL (ref 0.0–0.7)
Eosinophils Relative: 0 %
HCT: 35.9 % — ABNORMAL LOW (ref 36.0–46.0)
Hemoglobin: 11.9 g/dL — ABNORMAL LOW (ref 12.0–15.0)
LYMPHS ABS: 1.1 10*3/uL (ref 0.7–4.0)
Lymphocytes Relative: 5 %
MCH: 32.5 pg (ref 26.0–34.0)
MCHC: 33.1 g/dL (ref 30.0–36.0)
MCV: 98.1 fL (ref 78.0–100.0)
MONO ABS: 2.4 10*3/uL — AB (ref 0.1–1.0)
Monocytes Relative: 11 %
NEUTROS ABS: 18.1 10*3/uL — AB (ref 1.7–7.7)
Neutrophils Relative %: 84 %
PLATELETS: 246 10*3/uL (ref 150–400)
RBC: 3.66 MIL/uL — ABNORMAL LOW (ref 3.87–5.11)
RDW: 14.3 % (ref 11.5–15.5)
WBC: 21.6 10*3/uL — ABNORMAL HIGH (ref 4.0–10.5)

## 2016-06-02 LAB — GLUCOSE, CAPILLARY
GLUCOSE-CAPILLARY: 428 mg/dL — AB (ref 65–99)
GLUCOSE-CAPILLARY: 370 mg/dL — AB (ref 65–99)
Glucose-Capillary: 299 mg/dL — ABNORMAL HIGH (ref 65–99)

## 2016-06-02 LAB — CBG MONITORING, ED
GLUCOSE-CAPILLARY: 484 mg/dL — AB (ref 65–99)
Glucose-Capillary: >600 mg/dL (ref 65–99)

## 2016-06-02 LAB — TROPONIN I: Troponin I: 0.1 ng/mL (ref <0.03)

## 2016-06-02 LAB — I-STAT CG4 LACTIC ACID, ED: LACTIC ACID, VENOUS: 7.26 mmol/L — AB (ref 0.5–1.9)

## 2016-06-02 LAB — LACTIC ACID, PLASMA
LACTIC ACID, VENOUS: 5.8 mmol/L — AB (ref 0.5–1.9)
LACTIC ACID, VENOUS: 4.4 mmol/L — AB (ref 0.5–1.9)

## 2016-06-02 LAB — I-STAT TROPONIN, ED: Troponin i, poc: 0.01 ng/mL (ref 0.00–0.08)

## 2016-06-02 LAB — PROCALCITONIN: PROCALCITONIN: 12.22 ng/mL

## 2016-06-02 LAB — TSH: TSH: 1.871 u[IU]/mL (ref 0.350–4.500)

## 2016-06-02 LAB — MRSA PCR SCREENING: MRSA BY PCR: NEGATIVE

## 2016-06-02 MED ORDER — LEVOTHYROXINE SODIUM 100 MCG IV SOLR
68.5000 ug | Freq: Every day | INTRAVENOUS | Status: DC
  Administered 2016-06-03: 68.5 ug via INTRAVENOUS
  Filled 2016-06-02 (×3): qty 5

## 2016-06-02 MED ORDER — DEXTROSE-NACL 5-0.45 % IV SOLN
INTRAVENOUS | Status: DC
  Administered 2016-06-02 – 2016-06-03 (×3): via INTRAVENOUS

## 2016-06-02 MED ORDER — MIDAZOLAM HCL 2 MG/2ML IJ SOLN
INTRAMUSCULAR | Status: DC | PRN
  Administered 2016-06-02: 2 mg via INTRAVENOUS

## 2016-06-02 MED ORDER — ETOMIDATE 2 MG/ML IV SOLN
INTRAVENOUS | Status: DC | PRN
  Administered 2016-06-02: 20 mg via INTRAVENOUS

## 2016-06-02 MED ORDER — NOREPINEPHRINE BITARTRATE 1 MG/ML IV SOLN
0.0000 ug/min | INTRAVENOUS | Status: DC
  Administered 2016-06-02: 14 ug/min via INTRAVENOUS
  Administered 2016-06-02: 5 ug/min via INTRAVENOUS
  Filled 2016-06-02 (×3): qty 4

## 2016-06-02 MED ORDER — PANTOPRAZOLE SODIUM 40 MG IV SOLR
40.0000 mg | Freq: Every day | INTRAVENOUS | Status: DC
  Administered 2016-06-03: 40 mg via INTRAVENOUS
  Filled 2016-06-02 (×2): qty 40

## 2016-06-02 MED ORDER — MIDAZOLAM HCL 2 MG/2ML IJ SOLN
INTRAMUSCULAR | Status: AC
  Filled 2016-06-02: qty 2

## 2016-06-02 MED ORDER — MIDAZOLAM HCL 2 MG/2ML IJ SOLN
2.0000 mg | INTRAMUSCULAR | Status: DC | PRN
  Administered 2016-06-02: 2 mg via INTRAVENOUS

## 2016-06-02 MED ORDER — FENTANYL CITRATE (PF) 100 MCG/2ML IJ SOLN
INTRAMUSCULAR | Status: AC
  Filled 2016-06-02: qty 2

## 2016-06-02 MED ORDER — ASPIRIN 81 MG PO CHEW
81.0000 mg | CHEWABLE_TABLET | Freq: Every day | ORAL | Status: DC
  Administered 2016-06-02 – 2016-06-06 (×5): 81 mg via ORAL
  Filled 2016-06-02 (×5): qty 1

## 2016-06-02 MED ORDER — MIDAZOLAM HCL 2 MG/2ML IJ SOLN
2.0000 mg | INTRAMUSCULAR | Status: DC | PRN
  Administered 2016-06-02 (×2): 2 mg via INTRAVENOUS
  Filled 2016-06-02 (×3): qty 2

## 2016-06-02 MED ORDER — FENTANYL CITRATE (PF) 100 MCG/2ML IJ SOLN
INTRAMUSCULAR | Status: DC | PRN
  Administered 2016-06-02: 100 ug via INTRAVENOUS

## 2016-06-02 MED ORDER — ROCURONIUM BROMIDE 50 MG/5ML IV SOLN
INTRAVENOUS | Status: DC | PRN
  Administered 2016-06-02: 50 mg via INTRAVENOUS

## 2016-06-02 MED ORDER — PIPERACILLIN-TAZOBACTAM 3.375 G IVPB
3.3750 g | Freq: Three times a day (TID) | INTRAVENOUS | Status: AC
Start: 2016-06-02 — End: 2016-06-04
  Administered 2016-06-03 – 2016-06-04 (×7): 3.375 g via INTRAVENOUS
  Filled 2016-06-02 (×8): qty 50

## 2016-06-02 MED ORDER — FENTANYL CITRATE (PF) 100 MCG/2ML IJ SOLN: 50.0000 ug | Freq: Once | INTRAMUSCULAR | Status: DC

## 2016-06-02 MED ORDER — VANCOMYCIN HCL IN DEXTROSE 1-5 GM/200ML-% IV SOLN
1000.0000 mg | INTRAVENOUS | Status: DC
  Filled 2016-06-02: qty 200

## 2016-06-02 MED ORDER — SODIUM CHLORIDE 0.9 % IV SOLN
25.0000 ug/h | INTRAVENOUS | Status: DC
  Administered 2016-06-02: 50 ug/h via INTRAVENOUS
  Filled 2016-06-02 (×3): qty 50

## 2016-06-02 MED ORDER — PIPERACILLIN-TAZOBACTAM 3.375 G IVPB 30 MIN
3.3750 g | Freq: Once | INTRAVENOUS | Status: AC
  Administered 2016-06-02: 3.375 g via INTRAVENOUS
  Filled 2016-06-02: qty 50

## 2016-06-02 MED ORDER — SODIUM CHLORIDE 0.9 % IV SOLN
INTRAVENOUS | Status: DC
  Administered 2016-06-02: 5.4 [IU]/h via INTRAVENOUS
  Administered 2016-06-03: 0.7 [IU]/h via INTRAVENOUS
  Filled 2016-06-02 (×3): qty 2.5

## 2016-06-02 MED ORDER — FENTANYL BOLUS VIA INFUSION
50.0000 ug | INTRAVENOUS | Status: DC | PRN
  Filled 2016-06-02: qty 50

## 2016-06-02 MED ORDER — IOPAMIDOL (ISOVUE-300) INJECTION 61%
INTRAVENOUS | Status: DC
Start: 2016-06-02 — End: 2016-06-02
  Filled 2016-06-02: qty 30

## 2016-06-02 MED ORDER — SODIUM BICARBONATE 8.4 % IV SOLN
INTRAVENOUS | Status: AC
  Filled 2016-06-02: qty 100

## 2016-06-02 MED ORDER — VANCOMYCIN HCL 10 G IV SOLR
2000.0000 mg | Freq: Once | INTRAVENOUS | Status: AC
  Administered 2016-06-02: 2000 mg via INTRAVENOUS
  Filled 2016-06-02: qty 2000

## 2016-06-02 MED ORDER — SODIUM CHLORIDE 0.9 % IV SOLN
INTRAVENOUS | Status: DC
  Administered 2016-06-02: 23:00:00 via INTRAVENOUS

## 2016-06-02 MED ORDER — DEXTROSE-NACL 5-0.45 % IV SOLN: INTRAVENOUS | Status: DC

## 2016-06-02 MED ORDER — SODIUM BICARBONATE 8.4 % IV SOLN
INTRAVENOUS | Status: DC | PRN
  Administered 2016-06-02 (×2): 100 meq via INTRAVENOUS

## 2016-06-02 MED ORDER — HEPARIN SODIUM (PORCINE) 5000 UNIT/ML IJ SOLN
5000.0000 [IU] | Freq: Three times a day (TID) | INTRAMUSCULAR | Status: DC
  Administered 2016-06-02 – 2016-06-06 (×12): 5000 [IU] via SUBCUTANEOUS
  Filled 2016-06-02 (×12): qty 1

## 2016-06-02 NOTE — ED Notes (Signed)
Confirmed with critical care provider, pt to go to CT before being transported to the floor.

## 2016-06-02 NOTE — ED Provider Notes (Addendum)
[facility] DEPT Provider Note   CSN: [id number] Arrival date & time: 06/02/16  1408     History   Chief Complaint Chief Complaint  Patient presents with  . Hyperglycemia  . Altered Mental Status   Level V caveat due to altered mental status HPI Julia Holt is a 55 y.o. female.  HPI Patient has history of diabetes. Brought in after unresponsive at home. Husband found her. Sugar has been over 500. Had reported food poisoning last night with diarrhea and nausea vomiting. Had some confusion last night reportedly never checked insulin pump reservoir. 2" statins last night thinking her sugar was low when found in her posterior pharynx. She initially had been yelling and disoriented but mental status improved somewhat upon arrival. She had mild tachycardia with hypotension.   Past Medical History:  Diagnosis Date  . Diabetes mellitus without complication ([place])   . Hypertension   . Thyroid disease     Patient Active Problem List   Diagnosis Date Noted  . Diabetes mellitus type 1, controlled, without complications ([place]) [id number]  . Thyroid disease 05/03/2013  . Hypertension 05/03/2013    Past Surgical History:  Procedure Laterality Date  . BACK SURGERY    . BUNIONECTOMY Left     OB History    No data available       Home Medications    Prior to Admission medications   Medication Sig Start Date End Date Taking? Authorizing Provider  amLODipine (NORVASC) 10 MG tablet Take 10 mg by mouth daily.   Yes Historical Provider, MD  Insulin Human (INSULIN PUMP) SOLN Inject into the skin. Husband has no idea what kind of insulin she takes. Husband states she is on a insulin pump. Pharmacy has a record of Novolog 60 units twice daily.   Yes Historical Provider, MD  levothyroxine (SYNTHROID, LEVOTHROID) 137 MCG tablet Take 137 mcg by mouth daily before breakfast.   Yes Historical Provider, MD  quinapril (ACCUPRIL) 20 MG tablet Take 20 mg by mouth at bedtime.   Yes Historical  Provider, MD  spironolactone (ALDACTONE) 25 MG tablet Take 25 mg by mouth daily.   Yes Historical Provider, MD  glucose blood test strip 1 each by Other route as needed for other. Use as instructed    Historical Provider, MD  ibuprofen (ADVIL,MOTRIN) 400 MG tablet Take 1 tablet (400 mg total) by mouth every 6 (six) hours as needed. Patient not taking: Reported on 06/02/2016 08/30/14   Varney Biles, MD    Family History Family History  Problem Relation Age of Onset  . Hypertension Mother   . Hyperlipidemia Mother   . Dementia Mother   . Alzheimer's disease Father     Social History Social History  Substance Use Topics  . Smoking status: Never Smoker  . Smokeless tobacco: Never Used  . Alcohol use No     Allergies   Sulfa antibiotics   Review of Systems Review of Systems  Unable to perform ROS: Mental status change     Physical Exam Updated Vital Signs BP (!) 80/44   Pulse 98   Temp 98.5 F (36.9 C) (Rectal)   Resp (!) 29   Ht 5\' 6"  (1.676 m)   Wt 180 lb (81.6 kg)   SpO2 95%   BMI 29.05 kg/m   Physical Exam  Constitutional: She appears well-developed.  Eyes: EOM are normal.  Neck: Neck supple.  Cardiovascular:  Mild tachycardia  Pulmonary/Chest: Effort normal.  Abdominal: There is tenderness.  Some apparent  left lower quadrant tenderness  Neurological:  Patient is rather somnolent. Will move all extremity's. Awakens slightly to pain.  Skin: Skin is warm. Capillary refill takes 2 to 3 seconds.     ED Treatments / Results  Labs (all labs ordered are listed, but only abnormal results are displayed) Labs Reviewed  COMPREHENSIVE METABOLIC PANEL - Abnormal; Notable for the following:       Result Value   Sodium 131 (*)    Chloride 98 (*)    CO2 12 (*)    Glucose, Bld 878 (*)    BUN 46 (*)    Creatinine, Ser 2.41 (*)    Calcium 8.6 (*)    Total Protein 5.8 (*)    GFR calc non Af Amer 22 (*)    GFR calc Af Amer 25 (*)    Anion gap 21 (*)    All  other components within normal limits  CBC WITH DIFFERENTIAL/PLATELET - Abnormal; Notable for the following:    WBC 21.6 (*)    RBC 3.66 (*)    Hemoglobin 11.9 (*)    HCT 35.9 (*)    Neutro Abs 18.1 (*)    Monocytes Absolute 2.4 (*)    All other components within normal limits  I-STAT CG4 LACTIC ACID, ED - Abnormal; Notable for the following:    Lactic Acid, Venous 7.26 (*)    All other components within normal limits  I-STAT VENOUS BLOOD GAS, ED - Abnormal; Notable for the following:    pCO2, Ven 27.0 (*)    pO2, Ven 65.0 (*)    Bicarbonate 12.2 (*)    Acid-base deficit 13.0 (*)    All other components within normal limits  BLOOD GAS, VENOUS  URINALYSIS, ROUTINE W REFLEX MICROSCOPIC  I-STAT TROPOININ, ED    EKG  EKG Interpretation  Date/Time:  Thursday June 02 2016 14:09:39 EST Ventricular Rate:  108 PR Interval:    QRS Duration: 102 QT Interval:  341 QTC Calculation: 457 R Axis:   94 Text Interpretation:  Sinus rhythm Borderline right axis deviation Borderline repolarization abnormality Confirmed by Alvino Chapel  MD, Lashona Schaaf [phone]) on 06/02/2016 3:11:16 PM       Radiology Dg Chest Portable 1 View  Result Date: 06/02/2016 CLINICAL DATA:  Altered mental status. EXAM: PORTABLE CHEST 1 VIEW COMPARISON:  None. FINDINGS: Scoliosis rods are identified overlying the thoracic and upper lumbar spine. The heart size appears normal. There is decreased aeration to the left lung base which may reflect atelectasis, infiltrate or asymmetric edema. IMPRESSION: 1. Diminished aeration to left base as above. Cannot rule out pneumonia. Electronically Signed   By: Kerby Moors M.D.   On: 06/02/2016 14:45    Procedures Procedures (including critical care time)  Medications Ordered in ED Medications  dextrose 5 %-0.45 % sodium chloride infusion (not administered)  insulin regular (NOVOLIN R,HUMULIN R) 250 Units in sodium chloride 0.9 % 250 mL (1 Units/mL) infusion (not administered)    piperacillin-tazobactam (ZOSYN) IVPB 3.375 g (3.375 g Intravenous New Bag/Given 06/02/16 1547)  vancomycin (VANCOCIN) IVPB 1000 mg/200 mL premix (not administered)  vancomycin (VANCOCIN) 2,000 mg in sodium chloride 0.9 % 500 mL IVPB (not administered)  iopamidol (ISOVUE-300) 61 % injection (not administered)     Initial Impression / Assessment and Plan / ED Course  I have reviewed the triage vital signs and the nursing notes.  Pertinent labs & imaging results that were available during my care of the patient were reviewed by me and considered in  my medical decision making (see chart for details).  Clinical Course     Patient presents with hyperglycemia. Found to be in DKA. Lactic acid elevated. Infection felt less likely especially the pneumonia was possible on x-ray. White count is elevated however she is not febrile. Does have abdominal tenderness and CT scan done. Has had 3 L of fluid with the fourth running. Still hypotensive. Will admit to pulmonary critical care.  Will get CT scan for left lower quadrant abdominal pain.  CRITICAL CARE Performed by: Mackie Pai Total critical care time: 30 minutes Critical care time was exclusive of separately billable procedures and treating other patients. Critical care was necessary to treat or prevent imminent or life-threatening deterioration. Critical care was time spent personally by me on the following activities: development of treatment plan with patient and/or surrogate as well as nursing, discussions with consultants, evaluation of patient's response to treatment, examination of patient, obtaining history from patient or surrogate, ordering and performing treatments and interventions, ordering and review of laboratory studies, ordering and review of radiographic studies, pulse oximetry and re-evaluation of patient's condition.  On reevaluation patient's blood pressure has improved somewhat.   Final Clinical Impressions(s) / ED  Diagnoses   Final diagnoses:  Type 1 diabetes mellitus with ketoacidosis without coma [facility])    New Prescriptions New Prescriptions   No medications on file     Davonna Belling, MD 06/02/16 [place], MD 06/02/16 [phone]

## 2016-06-02 NOTE — Progress Notes (Signed)
CRITICAL VALUE ALERT  Critical value received: Lactic Acid 5.8 / Troponin 0.10  Date of notification:  12/14  Time of notification: 1956  Critical value read back: yes  Nurse who received alert:  Arnell Sieving, RN   MD notified (1st page):  Dr.Sommer  Time of first page: 2030  MD notified (2nd page):N/A  Time of second page: N/A  Responding MD:  Dr.Sommer  Time MD responded:  2110

## 2016-06-02 NOTE — ED Notes (Signed)
XRAY verified Central line ready for use.

## 2016-06-02 NOTE — H&P (Signed)
PULMONARY / CRITICAL CARE MEDICINE   Name: Julia Holt MRN: [id number] DOB: 03-Nov-1960    ADMISSION DATE:  06/02/2016 CONSULTATION DATE:  06/02/16  REFERRING MD:  Alvino Chapel - EDP  CHIEF COMPLAINT:  AMS  HISTORY OF PRESENT ILLNESS:  Pt is encephelopathic; therefore, this HPI is obtained from chart review. Julia Holt is a 55 y.o. F with PMH as outlined below.  On evening of 12/13, she ate meatballs for dinner and later had N/V/D.  Husband felt that she had food poisoning.  She apparently never checked her insulin pump and took a glucose tab as she felt that her glucose was low.  On 12/14, she was altered and somnolent.  Symptoms persisted into the afternoon and due to difficulty arousing pt, husband decided to bring her to ED.  In ED, she was found to have hyperglycemia with probable HHS / HONK.  PCCM was called due to concern for airway protection along with persistent hypotension despite 4L IVF.  She was later intubated and had CVL placed.  She is now being admitted to ICU and will undergo standard DKA protocol.  PAST MEDICAL HISTORY :  She  has a past medical history of Diabetes mellitus without complication ([place]); Hypertension; and Thyroid disease.  PAST SURGICAL HISTORY: She  has a past surgical history that includes Back surgery and Bunionectomy (Left).  Allergies  Allergen Reactions  . Sulfa Antibiotics Hives and Itching    No current facility-administered medications on file prior to encounter.    Current Outpatient Prescriptions on File Prior to Encounter  Medication Sig  . levothyroxine (SYNTHROID, LEVOTHROID) 137 MCG tablet Take 137 mcg by mouth daily before breakfast.  . glucose blood test strip 1 each by Other route as needed for other. Use as instructed  . ibuprofen (ADVIL,MOTRIN) 400 MG tablet Take 1 tablet (400 mg total) by mouth every 6 (six) hours as needed. (Patient not taking: Reported on 06/02/2016)    FAMILY HISTORY:  Her indicated that her mother is  alive. She indicated that her father is deceased.    SOCIAL HISTORY: She  reports that she has never smoked. She has never used smokeless tobacco. She reports that she does not drink alcohol or use drugs.  REVIEW OF SYSTEMS:  Unable to obtain as pt is encephalopathic.  SUBJECTIVE:  Now on vent, sedated and unresponsive.  VITAL SIGNS: BP 98/65   Pulse 99   Temp 98.5 F (36.9 C) (Rectal)   Resp (!) 42   Ht 5\' 6"  (1.676 m)   Wt 81.6 kg (180 lb)   SpO2 94%   BMI 29.05 kg/m   HEMODYNAMICS:    VENTILATOR SETTINGS:    INTAKE / OUTPUT: No intake/output data recorded.  PHYSICAL EXAMINATION: General:  Middle aged female, in NAD. Neuro:  Sedated on vent, does not follow commands. HEENT:  [place]/AT.  MM dry. Cardiovascular: Tachy, regular, no M/R/G. Lungs:  Clear bilaterally. ETT in place. Abdomen:  BS x 4.  Soft, NT/ND. Musculoskeletal:  No acute deformities, no edema. Skin:  Warm, dry.  LABS:  BMET  Recent Labs Lab 06/02/16 1430  NA 131*  K 5.0  CL 98*  CO2 12*  BUN 46*  CREATININE 2.41*  GLUCOSE 878*    Electrolytes  Recent Labs Lab 06/02/16 1430  CALCIUM 8.6*    CBC  Recent Labs Lab 06/02/16 1430  WBC 21.6*  HGB 11.9*  HCT 35.9*  PLT 246    Coag's No results for input(s): APTT, INR in  the last 168 hours.  Sepsis Markers  Recent Labs Lab 06/02/16 1436  LATICACIDVEN 7.26*    ABG No results for input(s): PHART, PCO2ART, PO2ART in the last 168 hours.  Liver Enzymes  Recent Labs Lab 06/02/16 1430  AST 38  ALT 34  ALKPHOS 91  BILITOT 1.0  ALBUMIN 3.5    Cardiac Enzymes No results for input(s): TROPONINI, PROBNP in the last 168 hours.  Glucose  Recent Labs Lab 06/02/16 1559  GLUCAP >600*    Imaging Dg Chest Portable 1 View  Result Date: 06/02/2016 CLINICAL DATA:  Altered mental status. EXAM: PORTABLE CHEST 1 VIEW COMPARISON:  None. FINDINGS: Scoliosis rods are identified overlying the thoracic and upper lumbar spine.  The heart size appears normal. There is decreased aeration to the left lung base which may reflect atelectasis, infiltrate or asymmetric edema. IMPRESSION: 1. Diminished aeration to left base as above. Cannot rule out pneumonia. Electronically Signed   By: Kerby Moors M.D.   On: 06/02/2016 14:45     STUDIES:  CXR 12/14 > no acute process. CT abd / pelv 12/14 >   CULTURES: Blood 12/14 >  ANTIBIOTICS: Zosyn 12/14 >  SIGNIFICANT EVENTS: 12/14 > admit.  LINES/TUBES: ETT 12/14 > L IJ CVL 12/14 >  DISCUSSION: 55 y.o. F admitted 12/14 with probable HSS / HONK.  Required intubation in ED for airway protection and also vasopressors for persistent hypotension.  ASSESSMENT / PLAN:  ENDOCRINE A:   Probable HHS / HONK. Hx DM, Hypothyroidism. P:   Insulin / fluids per DKA protocol. Continue preadmission synthroid. Assess TSH, Hgb A1c.  PULMONARY A: Inability to protect the airway - in the setting of HHS / HONK. P:   Full vent support, 8cc/kg. Assess ABG in 1 hour. Wean as able. Pulmonary hygiene. CXR in AM.  CARDIOVASCULAR A:  Hypotension - suspect primarily hypovolemic. Also exacerbated by sedation during / following intubation. Sinus tach - due to above. Hx HTN. P:  Continue IVF's - additional 2L NS bolus now. Levophed as needed to maintain MAP > 65. Trend lactate. Assess troponin. Hold preadmission amlodipine, quinapril, spironolactone.  RENAL A:   AKI - suspect pre-renal from hypovolemia. Mild hyponatremia. P:   Continue IVF's per DKA protocol. BMP q4hrs x 4. Correct electrolytes as indicated.  GASTROINTESTINAL A:   N/V/D - suspect due to HHS / HONK.  Possible intraabdominal process. GI prophylaxis. Nutrition. P:   Monitor clinically. F/u CT. SUP: Pantoprazole. NPO.  HEMATOLOGIC A:   VTE prophylaxis. P:  SCD's / Heparin. CBC in AM.  INFECTIOUS A:   Possible intraabdominal process - unclear at this point.  CT pending. P:   Empiric zosyn  for now. Follow cultures. Assess PCT.  NEUROLOGIC A:   Acute encephalopathy - presumed due to HHS / HONK. Chemical sedation. P:   Continue supportive care with fluids / insulin. Sedation: Fentanyl gtt / Midazolam PRN. RASS goal: 0 to -1. Daily WUA.   FAMILY  - Updates:  Husband updated at bedside.  - Inter-disciplinary family meet or Palliative Care meeting due by:  06/09/16.   CC time: 35 minutes.   Montey Hora, [place] [facility] Critical [facility] Medicine Pager: [phone]  or [phone] 06/02/2016, 5:29 PM  Attending Note  55 year old female with type one diabetes who neglected to refill her insulin pump presenting with DKA.  Patient also has lactic acidosis for N/V and likely dehydration.  On exam, she is dehydrated  with DMM and abdominal pain.  CXR I reviewed myself, ETT ok.  Will admit to the ICU, start abx for ?food poisoning mentioned by husband.  Levophed for BP support.  Aggressive hydration.  DKA protocol.  F/U on culture and will f/u.  The patient is critically ill with multiple organ systems failure and requires high complexity decision making for assessment and support, frequent evaluation and titration of therapies, application of advanced monitoring technologies and extensive interpretation of multiple databases.   Critical Care Time devoted to patient care services described in this note is  35  Minutes. This time reflects time of care of this signee Dr Jennet Maduro. This critical care time does not reflect procedure time, or teaching time or supervisory time of PA/NP/Med student/Med Resident etc but could involve care discussion time.  Rush Farmer, M.D. [facility] Pulmonary/Critical Care Medicine. Pager: [phone]. After hours pager: [phone].

## 2016-06-02 NOTE — ED Notes (Signed)
Husband and sister at bedside. Family updated to pt condition.

## 2016-06-02 NOTE — Progress Notes (Signed)
RT gave ABG results to ED doctor. Will try to contact CCM

## 2016-06-02 NOTE — Progress Notes (Signed)
RT spoke with Dr Oletta Darter about ABG. He recommends to repeat ABG at 2300 tonight.

## 2016-06-02 NOTE — Progress Notes (Signed)
[place] Progress Note Patient Name: Julia Holt DOB: 12-25-1960 MRN: [id number]   Date of Service  06/02/2016  HPI/Events of Note  Multiple issues: 1. Lactic Acid = 7.26 >> 5.8. Continue to trend Lactic Acid, 2. Troponin = 0.10. Demand ischemia? Initial EKG: Sinus Tachycardia and 3. Request for OGT.  eICU Interventions  Will order: 1. ASA 81 mg via tube now and Q day.  2. Continue to cycle Troponin. 3. Place OGT.         Calie Buttrey Cornelia Copa 06/02/2016, 9:14 PM

## 2016-06-02 NOTE — ED Notes (Signed)
RT at bedside.

## 2016-06-02 NOTE — ED Triage Notes (Signed)
Pt in from home via [facility] with CBG >600 and AMS. Per EMS, pt was initially unresponsive in her bed at home. Her husband came home to find her like this, called EMS. CBG was >600 for EMS, per husband, pt had gotten "food poisoning" from meatballs last night, had multiple episodes of diarrhea. Pt reportedly never checked her insulin pump reservoir last night, took a glucose tab, thinking her sugar was low last night. EMS gave 2L NS. Pt arrives altered, yelling and disoriented. Rectal temp 98.5

## 2016-06-02 NOTE — ED Notes (Signed)
Intensivist at bedside.

## 2016-06-02 NOTE — Procedures (Signed)
Central Venous Catheter Insertion Procedure Note Julia Holt [id number] 22-Mar-1961  Procedure: Insertion of Central Venous Catheter Indications: Assessment of intravascular volume, Drug and/or fluid administration and Frequent blood sampling  Procedure Details Consent: Risks of procedure as well as the alternatives and risks of each were explained to the (patient/caregiver).  Consent for procedure obtained. Time Out: Verified patient identification, verified procedure, site/side was marked, verified correct patient position, special equipment/implants available, medications/allergies/relevent history reviewed, required imaging and test results available.  Performed  Maximum sterile technique was used including antiseptics, cap, gloves, gown, hand hygiene, mask and sheet. Skin prep: Chlorhexidine; local anesthetic administered A antimicrobial bonded/coated triple lumen catheter was placed in the left internal jugular vein using the Seldinger technique.  Evaluation Blood flow good Complications: No apparent complications Patient did tolerate procedure well. Chest X-ray ordered to verify placement.  CXR: pending.  Procedure performed under direct ultrasound guidance for real time vessel cannulation.      Montey Hora, [place] [facility] & Critical [facility] Medicine Pager: [phone]  or [phone] 06/02/2016, 4:58 PM  Rush Farmer, M.D. [facility] Pulmonary/Critical Care Medicine. Pager: [phone]. After hours pager: [phone].

## 2016-06-02 NOTE — Progress Notes (Signed)
[place] Progress Note Patient Name: Julia Holt DOB: 09-29-1960 MRN: [id number]   Date of Service  06/02/2016  HPI/Events of Note  DKA - patient with ABG on 50%/PRVC 28/TV 470/P 5 = 7.225/34.3/240/14.2.  Metabolic acidosis should clear with insulin IV infusion.   eICU Interventions  Continue current ventilator management. Will order: 1. Repeat ABG at 11 PM.      Intervention Category Major Interventions: Acid-Base disturbance - evaluation and management  Sommer,Steven Eugene 06/02/2016, 7:08 PM

## 2016-06-02 NOTE — ED Notes (Signed)
Called RT, will transport pt to CT when respiratory arrives.

## 2016-06-02 NOTE — Procedures (Signed)
Intubation Procedure Note Julia Holt [id number] [birth year]  Procedure: Intubation Indications: Respiratory insufficiency  Procedure Details Consent: Risks of procedure as well as the alternatives and risks of each were explained to the (patient/caregiver).  Consent for procedure obtained. Time Out: Verified patient identification, verified procedure, site/side was marked, verified correct patient position, special equipment/implants available, medications/allergies/relevent history reviewed, required imaging and test results available.  Performed  Drugs:  100 mcg Fentanyl, 2 mg Versed, 20 mg Etomidate, 50 mg Rocuronium. VL x 1 using glidescope with # 3 blade.  8.0 tube visualized passing through vocal cords. Following intubation:  positive color change on ETCO2, condensation seen in endotracheal tube, equal breath sounds bilaterally.  Evaluation Hemodynamic Status: Persistent hypotension treated with pressors and fluid; O2 sats: stable throughout Patient's Current Condition: stable Complications: No apparent complications Patient did tolerate procedure well. Chest X-ray ordered to verify placement.  CXR: pending.   Montey Hora, [place] Pulmonary & Critical Care Medicine Pager: [phone]  or [phone] 06/02/2016, 4:56 PM  Rush Farmer, M.D. [facility] Pulmonary/Critical Care Medicine. Pager: [phone]. After hours pager: [phone].

## 2016-06-02 NOTE — Progress Notes (Signed)
Pharmacy Antibiotic Note  GARY STICKNEY is a 55 y.o. female admitted on 06/02/2016 with sepsis.  Pharmacy has been consulted for vancomycin dosing. Also with Zosyn ordered x 1 per MD. Afebrile, WBC 21.6. SCr 2.41, CrCl~29.   Plan: Vancomycin 2g IV x1; then 1g IV q24h Zosyn 3.375g (60min infusion) IV x1 per MD Monitor clinical progress, c/s, renal function, abx plan/LOT VT@SS  as indicated   Height: 5\' 6"  (167.6 cm) Weight: 180 lb (81.6 kg) IBW/kg (Calculated) : 59.3  Temp (24hrs), Avg:98.5 F (36.9 C), Min:98.5 F (36.9 C), Max:98.5 F (36.9 C)   Recent Labs Lab 06/02/16 1430 06/02/16 1436  WBC 21.6*  --   CREATININE 2.41*  --   LATICACIDVEN  --  7.26*    Estimated Creatinine Clearance: 29.1 mL/min (by C-G formula based on SCr of 2.41 mg/dL (H)).    Allergies  Allergen Reactions  . Sulfa Antibiotics Hives and Itching    Elicia Lamp, PharmD, BCPS Clinical Pharmacist 06/02/2016 3:39 PM

## 2016-06-02 NOTE — ED Notes (Signed)
Pt transported to CT by this nurse accompanied by RT.

## 2016-06-02 NOTE — ED Notes (Signed)
Dr. Alvino Chapel made aware of patients BP, will start 4th NS bolus liter.

## 2016-06-02 NOTE — Progress Notes (Signed)
[place] Progress Note Patient Name: Julia Holt DOB: 1960/11/12 MRN: [id number]   Date of Service  06/02/2016  HPI/Events of Note  Intubated and ventilated. Request for Foley catheter.   eICU Interventions  Will place Foley catheter.      Intervention Category Minor Interventions: Routine modifications to care plan (e.g. PRN medications for pain, fever)  Sommer,Steven Eugene 06/02/2016, 8:24 PM

## 2016-06-02 NOTE — ED Notes (Signed)
Attempted report x 1. Number left with secretary, told nurse will call back shortly.

## 2016-06-03 ENCOUNTER — Inpatient Hospital Stay (HOSPITAL_COMMUNITY): Payer: BLUE CROSS/BLUE SHIELD

## 2016-06-03 DIAGNOSIS — J9601 Acute respiratory failure with hypoxia: Secondary | ICD-10-CM

## 2016-06-03 DIAGNOSIS — E872 Acidosis, unspecified: Secondary | ICD-10-CM

## 2016-06-03 DIAGNOSIS — N179 Acute kidney failure, unspecified: Secondary | ICD-10-CM

## 2016-06-03 LAB — BASIC METABOLIC PANEL
ANION GAP: 8 (ref 5–15)
ANION GAP: 8 (ref 5–15)
ANION GAP: 9 (ref 5–15)
Anion gap: 6 (ref 5–15)
BUN: 26 mg/dL — ABNORMAL HIGH (ref 6–20)
BUN: 33 mg/dL — AB (ref 6–20)
BUN: 33 mg/dL — ABNORMAL HIGH (ref 6–20)
BUN: 35 mg/dL — ABNORMAL HIGH (ref 6–20)
CALCIUM: 8.6 mg/dL — AB (ref 8.9–10.3)
CALCIUM: 8.9 mg/dL (ref 8.9–10.3)
CALCIUM: 9 mg/dL (ref 8.9–10.3)
CALCIUM: 9 mg/dL (ref 8.9–10.3)
CO2: 22 mmol/L (ref 22–32)
CO2: 23 mmol/L (ref 22–32)
CO2: 24 mmol/L (ref 22–32)
CO2: 24 mmol/L (ref 22–32)
CREATININE: 1.17 mg/dL — AB (ref 0.44–1.00)
Chloride: 110 mmol/L (ref 101–111)
Chloride: 110 mmol/L (ref 101–111)
Chloride: 111 mmol/L (ref 101–111)
Chloride: 112 mmol/L — ABNORMAL HIGH (ref 101–111)
Creatinine, Ser: 1.37 mg/dL — ABNORMAL HIGH (ref 0.44–1.00)
Creatinine, Ser: 1.38 mg/dL — ABNORMAL HIGH (ref 0.44–1.00)
Creatinine, Ser: 1.58 mg/dL — ABNORMAL HIGH (ref 0.44–1.00)
GFR calc Af Amer: 50 mL/min — ABNORMAL LOW (ref >60)
GFR calc Af Amer: 50 mL/min — ABNORMAL LOW (ref >60)
GFR calc non Af Amer: 52 mL/min — ABNORMAL LOW (ref >60)
GFR, EST AFRICAN AMERICAN: 42 mL/min — AB (ref >60)
GFR, EST NON AFRICAN AMERICAN: 36 mL/min — AB (ref >60)
GFR, EST NON AFRICAN AMERICAN: 43 mL/min — AB (ref >60)
GFR, EST NON AFRICAN AMERICAN: 43 mL/min — AB (ref >60)
GLUCOSE: 143 mg/dL — AB (ref 65–99)
GLUCOSE: 151 mg/dL — AB (ref 65–99)
GLUCOSE: 256 mg/dL — AB (ref 65–99)
Glucose, Bld: 143 mg/dL — ABNORMAL HIGH (ref 65–99)
Potassium: 3.2 mmol/L — ABNORMAL LOW (ref 3.5–5.1)
Potassium: 3.3 mmol/L — ABNORMAL LOW (ref 3.5–5.1)
Potassium: 3.3 mmol/L — ABNORMAL LOW (ref 3.5–5.1)
Potassium: 3.8 mmol/L (ref 3.5–5.1)
SODIUM: 142 mmol/L (ref 135–145)
SODIUM: 142 mmol/L (ref 135–145)
Sodium: 141 mmol/L (ref 135–145)
Sodium: 142 mmol/L (ref 135–145)

## 2016-06-03 LAB — LACTIC ACID, PLASMA: LACTIC ACID, VENOUS: 1.2 mmol/L (ref 0.5–1.9)

## 2016-06-03 LAB — GLUCOSE, CAPILLARY
GLUCOSE-CAPILLARY: 129 mg/dL — AB (ref 65–99)
GLUCOSE-CAPILLARY: 137 mg/dL — AB (ref 65–99)
GLUCOSE-CAPILLARY: 139 mg/dL — AB (ref 65–99)
GLUCOSE-CAPILLARY: 142 mg/dL — AB (ref 65–99)
GLUCOSE-CAPILLARY: 160 mg/dL — AB (ref 65–99)
GLUCOSE-CAPILLARY: 188 mg/dL — AB (ref 65–99)
GLUCOSE-CAPILLARY: 249 mg/dL — AB (ref 65–99)
Glucose-Capillary: 107 mg/dL — ABNORMAL HIGH (ref 65–99)
Glucose-Capillary: 120 mg/dL — ABNORMAL HIGH (ref 65–99)
Glucose-Capillary: 122 mg/dL — ABNORMAL HIGH (ref 65–99)
Glucose-Capillary: 126 mg/dL — ABNORMAL HIGH (ref 65–99)
Glucose-Capillary: 128 mg/dL — ABNORMAL HIGH (ref 65–99)
Glucose-Capillary: 140 mg/dL — ABNORMAL HIGH (ref 65–99)
Glucose-Capillary: 143 mg/dL — ABNORMAL HIGH (ref 65–99)
Glucose-Capillary: 168 mg/dL — ABNORMAL HIGH (ref 65–99)
Glucose-Capillary: 177 mg/dL — ABNORMAL HIGH (ref 65–99)
Glucose-Capillary: 214 mg/dL — ABNORMAL HIGH (ref 65–99)
Glucose-Capillary: 217 mg/dL — ABNORMAL HIGH (ref 65–99)
Glucose-Capillary: 223 mg/dL — ABNORMAL HIGH (ref 65–99)
Glucose-Capillary: 253 mg/dL — ABNORMAL HIGH (ref 65–99)
Glucose-Capillary: 255 mg/dL — ABNORMAL HIGH (ref 65–99)
Glucose-Capillary: 290 mg/dL — ABNORMAL HIGH (ref 65–99)
Glucose-Capillary: >600 mg/dL (ref 65–99)

## 2016-06-03 LAB — MAGNESIUM: MAGNESIUM: 2.1 mg/dL (ref 1.7–2.4)

## 2016-06-03 LAB — HEMOGLOBIN A1C
Hgb A1c MFr Bld: 5.5 % (ref 4.8–5.6)
MEAN PLASMA GLUCOSE: 111 mg/dL

## 2016-06-03 LAB — CBC
HCT: 34.7 % — ABNORMAL LOW (ref 36.0–46.0)
HEMOGLOBIN: 11.8 g/dL — AB (ref 12.0–15.0)
MCH: 31.6 pg (ref 26.0–34.0)
MCHC: 34 g/dL (ref 30.0–36.0)
MCV: 93 fL (ref 78.0–100.0)
Platelets: 235 10*3/uL (ref 150–400)
RBC: 3.73 MIL/uL — ABNORMAL LOW (ref 3.87–5.11)
RDW: 13.8 % (ref 11.5–15.5)
WBC: 18.3 10*3/uL — ABNORMAL HIGH (ref 4.0–10.5)

## 2016-06-03 LAB — PROCALCITONIN: PROCALCITONIN: 8.18 ng/mL

## 2016-06-03 LAB — PHOSPHORUS: PHOSPHORUS: 2.4 mg/dL — AB (ref 2.5–4.6)

## 2016-06-03 MED ORDER — SODIUM CHLORIDE 0.9 % IV SOLN
30.0000 meq | INTRAVENOUS | Status: AC
  Administered 2016-06-03 (×2): 30 meq via INTRAVENOUS
  Filled 2016-06-03 (×2): qty 15

## 2016-06-03 MED ORDER — ORAL CARE MOUTH RINSE
15.0000 mL | Freq: Two times a day (BID) | OROMUCOSAL | Status: DC
  Administered 2016-06-03 – 2016-06-05 (×5): 15 mL via OROMUCOSAL

## 2016-06-03 MED ORDER — SODIUM PHOSPHATES 45 MMOLE/15ML IV SOLN
10.0000 mmol | Freq: Once | INTRAVENOUS | Status: AC
  Administered 2016-06-03: 10 mmol via INTRAVENOUS
  Filled 2016-06-03: qty 3.33

## 2016-06-03 MED ORDER — ORAL CARE MOUTH RINSE
15.0000 mL | Freq: Four times a day (QID) | OROMUCOSAL | Status: DC
  Administered 2016-06-03: 15 mL via OROMUCOSAL

## 2016-06-03 MED ORDER — INSULIN ASPART 100 UNIT/ML ~~LOC~~ SOLN
0.0000 [IU] | SUBCUTANEOUS | Status: DC
  Administered 2016-06-03: 2 [IU] via SUBCUTANEOUS
  Administered 2016-06-04: 3 [IU] via SUBCUTANEOUS

## 2016-06-03 MED ORDER — CHLORHEXIDINE GLUCONATE 0.12% ORAL RINSE (MEDLINE KIT)
15.0000 mL | Freq: Two times a day (BID) | OROMUCOSAL | Status: DC
  Administered 2016-06-03 (×2): 15 mL via OROMUCOSAL

## 2016-06-03 MED ORDER — CHLORHEXIDINE GLUCONATE 0.12 % MT SOLN
15.0000 mL | Freq: Two times a day (BID) | OROMUCOSAL | Status: DC
  Administered 2016-06-03: 15 mL via OROMUCOSAL

## 2016-06-03 MED ORDER — FENTANYL CITRATE (PF) 100 MCG/2ML IJ SOLN: 25.0000 ug | INTRAMUSCULAR | Status: DC | PRN

## 2016-06-03 MED ORDER — VANCOMYCIN HCL IN DEXTROSE 750-5 MG/150ML-% IV SOLN
750.0000 mg | Freq: Two times a day (BID) | INTRAVENOUS | Status: AC
  Administered 2016-06-03 – 2016-06-04 (×4): 750 mg via INTRAVENOUS
  Filled 2016-06-03 (×5): qty 150

## 2016-06-03 MED ORDER — WHITE PETROLATUM GEL
Status: DC | PRN
  Administered 2016-06-04: 1 via TOPICAL
  Filled 2016-06-03: qty 1

## 2016-06-03 NOTE — Care Management Note (Signed)
Case Management Note  Patient Details  Name: [name] MRN: [id number] Date of Birth: [birth year]  Subjective/Objective:  Pt admitted with DKA                  Action/Plan:  PTA from home with husband on the ventilator currently.  CM will continue to follow for discharge needs   Expected Discharge Date:                  Expected Discharge Plan:     In-House Referral:     Discharge planning Services  CM Consult  Post Acute Care Choice:    Choice offered to:     DME Arranged:    DME Agency:     HH Arranged:    HH Agency:     Status of Service:  In process, will continue to follow  If discussed at Long Length of Stay Meetings, dates discussed:    Additional Comments:  Maryclare Labrador, RN 06/03/2016, 3:02 PM

## 2016-06-03 NOTE — Progress Notes (Signed)
[place] Progress Note Patient Name: Julia Holt DOB: 21-Apr-1961 MRN: [id number]   Date of Service  06/03/2016  HPI/Events of Note  Hypokalemia  eICU Interventions  Potassium replaced     Intervention Category Intermediate Interventions: Electrolyte abnormality - evaluation and management  Julia Holt 06/03/2016, 2:10 AM

## 2016-06-03 NOTE — Progress Notes (Signed)
Inpatient Diabetes Program Recommendations  AACE/ADA: New Consensus Statement on Inpatient Glycemic Control (2015)  Target Ranges:  Prepandial:   less than 140 mg/dL      Peak postprandial:   less than 180 mg/dL (1-2 hours)      Critically ill patients:  140 - 180 mg/dL   Lab Results  Component Value Date   GLUCAP 120 (H) 06/03/2016   HGBA1C 5.5 06/02/2016    Review of Glycemic Control  Results for NAAVAH, MESENBRINK (MRN [id number]) as of 06/03/2016 07:40  Ref. Range 06/03/2016 02:34 06/03/2016 03:37 06/03/2016 04:44 06/03/2016 05:43 06/03/2016 06:54  Glucose-Capillary Latest Ref Range: 65 - 99 mg/dL 188 (H) 142 (H) 140 (H) 122 (H) 120 (H)    Diabetes history: Type 1 (from history)  Outpatient Diabetes medications: From MD notes dated 03/18/16 her rates are as follows: 12 am-0.75, 2am-0.7, 6 am-1.0, 9 am-1.0, 1130 am-0.775, 3 pm-0.725, and 9 pm-0.625. Her insulin to carb ratio is 1:12 in am, 1:17 at lunch and 1:12 with supper. Her sensitivity is 1:50. Her goal is 100-105. Patient's total daily basal rate is 18 units   Current orders for Inpatient glycemic control: DKA order set   Inpatient Diabetes Program Recommendations:  Consider transitioning the patient onto basal/bolus insulin; Lantus 10 units q day and Novolog sensitive correction  0-9 units q4h.    Patient was having a fair number of low blood sugars (based on last endocrinology note) therefore recommending Lantus 10 units vs. Lantus 18 units which is her current basal rate in her pump.   Gentry Fitz, RN, BA, MHA, CDE Diabetes Coordinator Inpatient Diabetes Program  [phone] (Team Pager) [phone] ([place]) 06/03/2016 8:00 AM

## 2016-06-03 NOTE — Progress Notes (Signed)
Pharmacy Antibiotic Note  Julia Holt is a 55 y.o. female admitted on 06/02/2016 with empiric for sepsis, unknown source..  Pharmacy has been consulted for Vancomycin and Zosyn dosing. UOP ~1 cc/kg/hr. WBC and PCT is trending down. Lactic acid now within normal limits.   Plan: Zosyn 3.375g IV q8h (4 hour infusion). Adjust Vancomycin to 750mg  IV every 12 hours for improved renal function.  Monitor renal function, clinical status, and culture results.   Height: 5\' 6"  (167.6 cm) Weight: 218 lb 11.1 oz (99.2 kg) IBW/kg (Calculated) : 59.3  Temp (24hrs), Avg:97.9 F (36.6 C), Min:97.5 F (36.4 C), Max:98.5 F (36.9 C)   Recent Labs Lab 06/02/16 1430 06/02/16 1436 06/02/16 1824 06/02/16 2003 06/03/16 0027 06/03/16 0356 06/03/16 0358  WBC 21.6*  --   --   --   --   --  18.3*  CREATININE 2.41*  --  2.41*  --  1.58* 1.38* 1.37*  LATICACIDVEN  --  7.26* 5.8* 4.4*  --   --   --     Estimated Creatinine Clearance: 56.5 mL/min (by C-G formula based on SCr of 1.37 mg/dL (H)).    Allergies  Allergen Reactions  . Sulfa Antibiotics Hives and Itching    Antimicrobials this admission: Vancomycin 12/14 >> Zosyn 12/14 >>  Dose adjustments this admission:  Microbiology results: 12/14 BCx:  12/14 MRSA PCR: negative  Thank you for allowing pharmacy to be a part of this patient's care.  Sloan Leiter, PharmD, BCPS Clinical Pharmacist [phone] until 3:30 PM today [phone] after hours 06/03/2016 10:22 AM

## 2016-06-03 NOTE — Progress Notes (Signed)
PULMONARY / CRITICAL CARE MEDICINE   Name: Julia Holt MRN: [id number] DOB: 1960/10/21    ADMISSION DATE:  06/02/2016 CONSULTATION DATE:  06/02/16  REFERRING MD:  Alvino Chapel - EDP  CHIEF COMPLAINT:  AMS    Julia Holt is a 55 y.o. F with PMH as outlined below.  On evening of 12/13, she ate meatballs for dinner and later had N/V/D.  Husband felt that she had food poisoning.  She apparently never checked her insulin pump and took a glucose tab as she felt that her glucose was low.  On 12/14, she was altered and somnolent.  Symptoms persisted into the afternoon and due to difficulty arousing pt, husband decided to bring her to ED.  In ED, she was found to have hyperglycemia with probable HHS / HONK.  PCCM was called due to concern for airway protection along with persistent hypotension despite 4L IVF.  She was later intubated and had CVL placed.  She is now being admitted to ICU and will undergo standard DKA protocol.   SIGNIFICANT EVENTS: 12/14 > admit. \ETT 12/14 > L IJ CVL 12/14 >    SUBJECTIVE/OVERNIGHT/INTERVAL HX 12/15 - not on pressoprs. Meets sBT criteria. Became apenic on SBT but fent gtt stillon board. Now 1h post fent gtt -> more awake but still drifitng to sleep. Indicates desire for extubation. Off levophed    VITAL SIGNS: BP (!) 103/58   Pulse 87   Temp 97.5 F (36.4 C) (Oral)   Resp 20   Ht 5\' 6"  (1.676 m)   Wt 99.2 kg (218 lb 11.1 oz)   SpO2 100%   BMI 35.30 kg/m   HEMODYNAMICS:    VENTILATOR SETTINGS: Vent Mode: PRVC FiO2 (%):  [40 %-100 %] 40 % Set Rate:  [20 bmp-28 bmp] 20 bmp Vt Set:  [470 mL] 470 mL PEEP:  [5 cmH20] 5 cmH20 Plateau Pressure:  [18 cmH20] 18 cmH20  INTAKE / OUTPUT: I/O last 3 completed shifts: In: 2267.9 [I.V.:1952.9; IV Piggyback:315] Out: [id number] [Urine:1235]  PHYSICAL EXAMINATION: General:  Middle aged female, in NAD. Neuro:  RASS -1 , Follows all commands. Good head strength. Good Vt of 900cc uponinstruction.  Moves all4s HEENT:  [place]/AT.  MM dry. Cardiovascular: Tachy, regular, no M/R/G. Lungs:  Clear bilaterally. ETT in place. Abdomen:  BS x 4.  Soft, NT/ND. Musculoskeletal:  No acute deformities, no edema. Skin:  Warm, dry.  LABS:  PULMONARY  Recent Labs Lab 06/02/16 1436 06/02/16 1818 06/02/16 2240  PHART  --  7.225* 7.508*  PCO2ART  --  34.3 25.4*  PO2ART  --  240.0* 182*  HCO3 12.2* 14.2* 20.0  TCO2 13 15  --   O2SAT 90.0 100.0 99.3    CBC  Recent Labs Lab 06/02/16 1430 06/03/16 0358  HGB 11.9* 11.8*  HCT 35.9* 34.7*  WBC 21.6* 18.3*  PLT 246 235    COAGULATION No results for input(s): INR in the last 168 hours.  CARDIAC   Recent Labs Lab 06/02/16 1824  TROPONINI 0.10*   No results for input(s): PROBNP in the last 168 hours.   CHEMISTRY  Recent Labs Lab 06/02/16 1430 06/02/16 1824 06/03/16 0027 06/03/16 0356 06/03/16 0358  NA 131* 139 142 141 142  K 5.0 3.7 3.2* 3.3* 3.3*  CL 98* 108 111 110 110  CO2 12* 14* 22 23 24   GLUCOSE 878* 584* 256* 151* 143*  BUN 46* 45* 35* 33* 33*  CREATININE 2.41* 2.41* 1.58* 1.38* 1.37*  CALCIUM  8.6* 8.1* 9.0 8.9 9.0  MG  --   --   --   --  2.1  PHOS  --   --   --   --  2.4*   Estimated Creatinine Clearance: 56.5 mL/min (by C-G formula based on SCr of 1.37 mg/dL (H)).   LIVER  Recent Labs Lab 06/02/16 1430  AST 38  ALT 34  ALKPHOS 91  BILITOT 1.0  PROT 5.8*  ALBUMIN 3.5     INFECTIOUS  Recent Labs Lab 06/02/16 1436 06/02/16 1824 06/02/16 2003  LATICACIDVEN 7.26* 5.8* 4.4*  PROCALCITON  --  12.22  --      ENDOCRINE CBG (last 3)   Recent Labs  06/03/16 0654 06/03/16 0804 06/03/16 0947  GLUCAP 120* 143* 139*         IMAGING x48h  - image(s) personally visualized  -   highlighted in bold Ct Abdomen Pelvis Wo Contrast  Result Date: 06/02/2016 CLINICAL DATA:  Nausea, vomiting and diarrhea beginning 06/01/2016 elevated white blood cell count. Altered level of consciousness  06/02/2016. EXAM: CT ABDOMEN AND PELVIS WITHOUT CONTRAST TECHNIQUE: Multidetector CT imaging of the abdomen and pelvis was performed following the standard protocol without IV contrast. COMPARISON:  None. FINDINGS: Lower chest: Heart size is mildly enlarged. No pleural or pericardial effusion. Dependent atelectasis is noted. Hepatobiliary: There is diffuse and marked fatty infiltration of the liver without focal lesion. The gallbladder and biliary tree are unremarkable. Pancreas: Unremarkable. No pancreatic ductal dilatation or surrounding inflammatory changes. Spleen: Normal in size without focal abnormality. Adrenals/Urinary Tract: Adrenal glands are unremarkable. Kidneys are normal, without renal calculi, focal lesion, or hydronephrosis. Bladder is unremarkable. Stomach/Bowel: Stomach is within normal limits. Appendix appears normal. No evidence of bowel wall thickening, distention, or inflammatory changes. Vascular/Lymphatic: Mild calcific atherosclerotic vascular disease is seen. No adenopathy. Reproductive: Uterus and bilateral adnexa are unremarkable. Other: No abdominal wall hernia or abnormality. No abdominopelvic ascites. Musculoskeletal: No acute abnormality. Scoliosis is noted. The patient is status post spinal fusion. Hardware is incompletely imaged. Degenerative disc disease L4-5 and L5-S1 is noted. Right much worse than left hip osteoarthritis is seen. IMPRESSION: No acute abnormality or finding to explain the patient's symptoms. Diffuse fatty infiltration of the liver. Electronically Signed   By: Inge Rise M.D.   On: 06/02/2016 19:57   Dg Chest Port 1 View  Result Date: 06/03/2016 CLINICAL DATA:  Respiratory failure, shortness of breath EXAM: PORTABLE CHEST 1 VIEW COMPARISON:  Portable chest x-ray of June 02, 2016 FINDINGS: The lungs are adequately inflated. There remains increased density at the left lung base. There is partial obscuration of the left hemidiaphragm. The interstitial  markings are coarse though stable. The cardiac silhouette is top-normal in size. The pulmonary vascularity is normal. The endotracheal tube tip lies approximately 5.7 cm of above the carina. The tip is at the level of the clavicular heads. The esophagogastric tube tip and proximal port project below the inferior margin of the image. The left internal jugular venous catheter tip projects over the midportion of the SVC. IMPRESSION: Stable appearance of the chest since yesterday's study. Left lower lobe atelectasis or pneumonia and probable small left pleural effusion. Somewhat high positioning of the endotracheal tube. Correlation with the type of tube present is needed to assess the adequacy of this positioning. Electronically Signed   By: David  Martinique M.D.   On: 06/03/2016 07:20   Dg Chest Portable 1 View  Result Date: 06/02/2016 CLINICAL DATA:  Status post intubation today  EXAM: PORTABLE CHEST 1 VIEW COMPARISON:  Single-view of the chest 12/scratch the single view of the chest earlier today FINDINGS: Endotracheal tube is in place with the tip just above the clavicular heads, well above the carina. New left IJ catheter tip projects near the superior cavoatrial junction. No pneumothorax. Bibasilar airspace opacities are unchanged. Marked scoliosis noted. IMPRESSION: ETT and IJ catheter projecting good position. No pneumothorax or other change. Electronically Signed   By: Inge Rise M.D.   On: 06/02/2016 18:06   Dg Chest Portable 1 View  Result Date: 06/02/2016 CLINICAL DATA:  Altered mental status. EXAM: PORTABLE CHEST 1 VIEW COMPARISON:  None. FINDINGS: Scoliosis rods are identified overlying the thoracic and upper lumbar spine. The heart size appears normal. There is decreased aeration to the left lung base which may reflect atelectasis, infiltrate or asymmetric edema. IMPRESSION: 1. Diminished aeration to left base as above. Cannot rule out pneumonia. Electronically Signed   By: Kerby Moors  M.D.   On: 06/02/2016 14:45   Dg Abd Portable 1v  Result Date: 06/02/2016 CLINICAL DATA:  OG tube placement EXAM: PORTABLE ABDOMEN - 1 VIEW COMPARISON:  CT 06/02/2016 FINDINGS: Partially visualized spinal hardware. Esophageal tube tip projects over the stomach. Atelectasis or infiltrate left base. Upper bowel-gas pattern unremarkable. IMPRESSION: Esophageal tube tip overlies the stomach Electronically Signed   By: Donavan Foil M.D.   On: 06/02/2016 22:38      DISCUSSION: 55 y.o. F admitted 12/14 with probable HSS / HONK.  Required intubation in ED for airway protection and also vasopressors for persistent hypotension.  ASSESSMENT / PLAN:  ENDOCRINE A:   Probable HHS / HONK. Hx DM, Hypothyroidism.  12/15 - gap 8, bic 24.  P:   Insulin / fluids per DKA protocol - reduce fluids to d5 half 75cc/h and titirate insuling accordingly Transition to SQ insulin after -> post extubation  + 4h NPO post extdubation + then clear for diet. Continue preadmission synthroid. Assess TSH, Hgb A1c.  PULMONARY A: Inability to protect the airway - in the setting of HHS / HONK.   -meets sbt criteria P:   SBT and if does well extubate 06/03/16 Full vent support totherwsie,   CARDIOVASCULAR A:  Hypotension - suspect primarily hypovolemic. Also exacerbated by sedation during / following intubation. Sinus tach - due to above. Hx HTN.   - off pressors  P:  Continue IVF's -per dka MAP goal > 65; ddc levophed Hold preadmission amlodipine, quinapril, spironolactone.  RENAL A:   AKI - suspect pre-renal from hypovolemia.   - rapidly improving. Mild low phos  P:   Replete  phos Continue IVF's per DKA protocol. BMP q4hrs daily Correct electrolytes as indicated.  GASTROINTESTINAL A:   N/V/D - suspect due to HHS / HONK.  Possible intraabdominal process. GI prophylaxis. Nutrition.   - nil acute P:   Monitor clinically. F/u CT. SUP: Pantoprazole. NPO.  HEMATOLOGIC A:   VTE  prophylaxis. P:  SCD's / Heparin. CBC in AM.  INFECTIOUS A:   Possible intraabdominal process - unclear at this point.  CT nil acute  P:   Empiric zosyn for now. Follow cultures. Assess PCT.  NEUROLOGIC A:   Acute encephalopathy - presumed due to HHS / HONK. Chemical sedation.    - resolving/resolved  P:   Continue supportive care with fluids / insulin. Sedation: Fentanyl prn  / Midazolam PRN. RASS goal: 0 to -1. Daily WUA.   FAMILY  - Updates:  Husband updated at bedside  at admit 06/02/2016 . No family at bedside 06/21/13/7  - Inter-disciplinary family meet or Palliative Care meeting due by:  06/09/16.  GLOBAL - aim to extubate 06/03/16. And post extubation when cleared for solids transition to SQ insulin. Till then d5half at lowr rate with insulin gtt. Monitory lytes   The patient is critically ill with multiple organ systems failure and requires high complexity decision making for assessment and support, frequent evaluation and titration of therapies, application of advanced monitoring technologies and extensive interpretation of multiple databases.   Critical Care Time devoted to patient care services described in this note is  30  Minutes. This time reflects time of care of this signee Dr Brand Males. This critical care time does not reflect procedure time, or teaching time or supervisory time of PA/NP/Med student/Med Resident etc but could involve care discussion time    Dr. Brand Males, M.D., [facility].C.P Pulmonary and Critical Care Medicine Staff Physician [place] [facility] Pager: [phone], If no answer or between  15:00h - 7:00h: call [phone]  06/03/2016 10:07 AM

## 2016-06-03 NOTE — Procedures (Signed)
Extubation Procedure Note  Patient Details:   Name: Julia Holt DOB: [birth year] MRN: [id number]   Airway Documentation:  Airway 7.5 mm (Active)  Secured at (cm) 24 cm 06/03/2016 11:20 AM  Measured From Lips 06/03/2016 11:20 AM  [place] 06/03/2016 11:20 AM  Secured By [employer] 06/03/2016 11:20 AM  Tube Holder Repositioned Yes 06/03/2016 11:20 AM  Site Condition Dry 06/03/2016 11:20 AM    Evaluation  O2 sats: stable throughout Complications: No apparent complications Patient did tolerate procedure well. Bilateral Breath Sounds: Clear   Yes   Patient extubated to 2lnc. Vital signs stable at this time. No complications. RT will continue to monitor.  Mcneil Sober 06/03/2016, 12:17 PM

## 2016-06-03 NOTE — Progress Notes (Signed)
[facility] Progress Note Patient Name: Julia Holt DOB: 1960-07-02 MRN: [id number]   Date of Service  06/03/2016  HPI/Events of Note  Now off of her Insulin IV infusion and only ate 1/2 of her dinner. Blood glucose = 129.  eICU Interventions  Will order: 1. D/C IV insulin infusion. 2. Q 4 hours sensitive Novolog SSI coverage.      Intervention Category Intermediate Interventions: Hyperglycemia - evaluation and treatment  Sommer,Steven Cornelia Copa 06/03/2016, 6:56 PM

## 2016-06-04 ENCOUNTER — Inpatient Hospital Stay (HOSPITAL_COMMUNITY): Payer: BLUE CROSS/BLUE SHIELD

## 2016-06-04 DIAGNOSIS — R740 Nonspecific elevation of levels of transaminase and lactic acid dehydrogenase [LDH]: Secondary | ICD-10-CM

## 2016-06-04 LAB — HEPATIC FUNCTION PANEL
ALT: 145 U/L — ABNORMAL HIGH (ref 14–54)
AST: 136 U/L — ABNORMAL HIGH (ref 15–41)
Albumin: 3.1 g/dL — ABNORMAL LOW (ref 3.5–5.0)
Alkaline Phosphatase: 74 U/L (ref 38–126)
BILIRUBIN DIRECT: 0.2 mg/dL (ref 0.1–0.5)
BILIRUBIN INDIRECT: 0.9 mg/dL (ref 0.3–0.9)
BILIRUBIN TOTAL: 1.1 mg/dL (ref 0.3–1.2)
Total Protein: 5.6 g/dL — ABNORMAL LOW (ref 6.5–8.1)

## 2016-06-04 LAB — CBC WITH DIFFERENTIAL/PLATELET
BASOS ABS: 0 10*3/uL (ref 0.0–0.1)
Basophils Relative: 0 %
Eosinophils Absolute: 0 10*3/uL (ref 0.0–0.7)
Eosinophils Relative: 0 %
HEMATOCRIT: 35.5 % — AB (ref 36.0–46.0)
Hemoglobin: 11.7 g/dL — ABNORMAL LOW (ref 12.0–15.0)
LYMPHS PCT: 12 %
Lymphs Abs: 1.7 10*3/uL (ref 0.7–4.0)
MCH: 31.7 pg (ref 26.0–34.0)
MCHC: 33 g/dL (ref 30.0–36.0)
MCV: 96.2 fL (ref 78.0–100.0)
Monocytes Absolute: 0.6 10*3/uL (ref 0.1–1.0)
Monocytes Relative: 5 %
NEUTROS ABS: 11.7 10*3/uL — AB (ref 1.7–7.7)
Neutrophils Relative %: 83 %
PLATELETS: 178 10*3/uL (ref 150–400)
RBC: 3.69 MIL/uL — AB (ref 3.87–5.11)
RDW: 14.4 % (ref 11.5–15.5)
WBC: 14.1 10*3/uL — AB (ref 4.0–10.5)

## 2016-06-04 LAB — GLUCOSE, CAPILLARY
Glucose-Capillary: 198 mg/dL — ABNORMAL HIGH (ref 65–99)
Glucose-Capillary: 217 mg/dL — ABNORMAL HIGH (ref 65–99)
Glucose-Capillary: 293 mg/dL — ABNORMAL HIGH (ref 65–99)
Glucose-Capillary: 188 mg/dL — ABNORMAL HIGH (ref 65–99)
Glucose-Capillary: 244 mg/dL — ABNORMAL HIGH (ref 65–99)
Glucose-Capillary: 328 mg/dL — ABNORMAL HIGH (ref 65–99)

## 2016-06-04 LAB — BASIC METABOLIC PANEL
Anion gap: 6 (ref 5–15)
BUN: 20 mg/dL (ref 6–20)
CO2: 24 mmol/L (ref 22–32)
CREATININE: 1.15 mg/dL — AB (ref 0.44–1.00)
Calcium: 8.5 mg/dL — ABNORMAL LOW (ref 8.9–10.3)
Chloride: 108 mmol/L (ref 101–111)
GFR calc Af Amer: >60 mL/min (ref >60)
GFR calc non Af Amer: 53 mL/min — ABNORMAL LOW (ref >60)
GLUCOSE: 315 mg/dL — AB (ref 65–99)
POTASSIUM: 4.2 mmol/L (ref 3.5–5.1)
SODIUM: 138 mmol/L (ref 135–145)

## 2016-06-04 LAB — PROCALCITONIN: Procalcitonin: 5.56 ng/mL

## 2016-06-04 LAB — MAGNESIUM: MAGNESIUM: 1.9 mg/dL (ref 1.7–2.4)

## 2016-06-04 LAB — PHOSPHORUS: PHOSPHORUS: 2.3 mg/dL — AB (ref 2.5–4.6)

## 2016-06-04 LAB — LACTIC ACID, PLASMA: LACTIC ACID, VENOUS: 0.9 mmol/L (ref 0.5–1.9)

## 2016-06-04 LAB — TROPONIN I
TROPONIN I: 0.16 ng/mL — AB (ref <0.03)
Troponin I: 0.1 ng/mL (ref <0.03)

## 2016-06-04 LAB — PROTIME-INR
INR: 1.05
Prothrombin Time: 13.8 seconds (ref 11.4–15.2)

## 2016-06-04 MED ORDER — SODIUM PHOSPHATES 45 MMOLE/15ML IV SOLN
10.0000 mmol | Freq: Once | INTRAVENOUS | Status: AC
  Administered 2016-06-04: 10 mmol via INTRAVENOUS
  Filled 2016-06-04: qty 3.33

## 2016-06-04 MED ORDER — LEVOTHYROXINE SODIUM 137 MCG PO TABS
137.0000 ug | ORAL_TABLET | Freq: Every day | ORAL | Status: DC
  Administered 2016-06-04 – 2016-06-06 (×3): 137 ug via ORAL
  Filled 2016-06-04 (×4): qty 1

## 2016-06-04 MED ORDER — INSULIN ASPART 100 UNIT/ML ~~LOC~~ SOLN
0.0000 [IU] | SUBCUTANEOUS | Status: DC
  Administered 2016-06-04: 3 [IU] via SUBCUTANEOUS
  Administered 2016-06-04: 5 [IU] via SUBCUTANEOUS
  Administered 2016-06-04: 8 [IU] via SUBCUTANEOUS

## 2016-06-04 MED ORDER — INSULIN ASPART 100 UNIT/ML ~~LOC~~ SOLN
0.0000 [IU] | Freq: Three times a day (TID) | SUBCUTANEOUS | Status: DC
  Administered 2016-06-04: 3 [IU] via SUBCUTANEOUS
  Administered 2016-06-04 – 2016-06-05 (×2): 5 [IU] via SUBCUTANEOUS
  Administered 2016-06-05: 11 [IU] via SUBCUTANEOUS
  Administered 2016-06-05: 3 [IU] via SUBCUTANEOUS
  Administered 2016-06-06: 5 [IU] via SUBCUTANEOUS

## 2016-06-04 MED ORDER — INSULIN ASPART 100 UNIT/ML ~~LOC~~ SOLN
0.0000 [IU] | Freq: Every day | SUBCUTANEOUS | Status: DC
  Administered 2016-06-04: 4 [IU] via SUBCUTANEOUS
  Administered 2016-06-05: 2 [IU] via SUBCUTANEOUS

## 2016-06-04 MED ORDER — INSULIN GLARGINE 100 UNIT/ML ~~LOC~~ SOLN
10.0000 [IU] | Freq: Every day | SUBCUTANEOUS | Status: DC
  Administered 2016-06-04 – 2016-06-05 (×2): 10 [IU] via SUBCUTANEOUS
  Filled 2016-06-04 (×2): qty 0.1

## 2016-06-04 MED ORDER — MAGNESIUM SULFATE 2 GM/50ML IV SOLN
2.0000 g | Freq: Once | INTRAVENOUS | Status: AC
Start: 2016-06-04 — End: 2016-06-04
  Administered 2016-06-04: 2 g via INTRAVENOUS
  Filled 2016-06-04: qty 50

## 2016-06-04 NOTE — Progress Notes (Addendum)
[facility] TEAM 1 - Stepdown/ICU TEAM  Julia Holt  [id number] DOB: May 11, 1961 DOA: 06/02/2016 PCP: Shellia Carwin, PA-C    Brief Narrative:  55 yo F with hx DM, HTN, and hypothyroidsim who on the evening of 12/13 ate meatballs for dinner and later developed N/V/D.  Husband felt that she had food poisoning.  She never checked her insulin pump but took a glucose tab as she felt that her glucose was low.  On 12/14 she became altered and somnolent so her husband brought her to the ED.  In the ED she was found to be in [place], and suffered persistent hypotension despite 4L IVF.  She was intubated and had a CVL placed.    Significant Events: 12/14  Admit - intubated - L IJ CVL 12/15 extubated  12/16 [facility] assumed care   Subjective: The pt is awake and alert.  She denies cp, sob, n/v, or abdom pain.  She admits she forgot to fill her insulin pump, but states this is a first for her.  She has had DM since she was 69 and has never before experienced DKA per her report.    Assessment & Plan:  DKA / HONK - DM  Neglected to fill her insulin pump - now off insulin gtt - CBG trending up - follow CBGs w/ SSI - A1c 5.5 - stop dextrose IVF - pt appears well educated on disease process - appears this was a simple mistake - consider transitioning back to insulin pump in AM if pt otherwise stable   Inability to protect the airway Due to obtundation in DKA - extubated 12/15  Mildly elevated troponin Likely demand ischemia related to above - follow trend - monitor on tele - check TTE to r/o WMA   Hypotension - hypovolemic Resolved w/ volume expansion and resolution of DKA  HTN BP controlled at this time    AKI pre-renal from hypovolemia - crt slowly improving - follow   Recent Labs Lab 06/03/16 0027 06/03/16 0356 06/03/16 0358 06/03/16 1620 06/04/16 0000  CREATININE 1.58* 1.38* 1.37* 1.17* 1.15*    Hyponatremia Due to above - resolved   Normocytic anemia  Check Fe studies - no  gross blood loss   N/V/D  Most likely due to DKA itself - stop abx after last dose today and follow   Hepatic Steatosis  Noted on CT abdom - LFTs normal initially then mildly elevated - recheck in AM - pt counseled on need for weight loss   Acute encephalopathy due to HONK  Hypothyroidism Cont home synthroid dose - TSH 1.871  Obesity - Body mass index is 35.3 kg/m.   DVT prophylaxis: SQ heparin  Code Status: FULL CODE Family Communication: no family present at time of exam  Disposition Plan: transfer to tele bed - follow CBG - f/u TTE and troponin - watch off abx   Consultants:  PCCM  Antimicrobials:  Zosyn 12/14 > Vanc 12/14 >  Objective: Blood pressure (!) 108/56, pulse 71, temperature 98.2 F (36.8 C), temperature source Oral, resp. rate 15, height 5\' 6"  (1.676 m), weight 99.2 kg (218 lb 11.1 oz), SpO2 94 %.  Intake/Output Summary (Last 24 hours) at 06/04/16 0746 Last data filed at 06/04/16 0700  Gross per 24 hour  Intake          3332.83 ml  Output             1305 ml  Net  2027.83 ml   Filed Weights   06/02/16 1419 06/02/16 2000  Weight: 81.6 kg (180 lb) 99.2 kg (218 lb 11.1 oz)    Examination: General: No acute respiratory distress Lungs: Clear to auscultation bilaterally without wheezes or crackles Cardiovascular: Regular rate and rhythm without murmur gallop or rub normal S1 and S2 Abdomen: Nontender, nondistended, soft, bowel sounds positive, no rebound, no ascites, no appreciable mass Extremities: No significant cyanosis, clubbing, or edema bilateral lower extremities  CBC:  Recent Labs Lab 06/02/16 1430 06/03/16 0358 06/04/16 0448  WBC 21.6* 18.3* 14.1*  NEUTROABS 18.1*  --  11.7*  HGB 11.9* 11.8* 11.7*  HCT 35.9* 34.7* 35.5*  MCV 98.1 93.0 96.2  PLT 246 235 0000000   Basic Metabolic Panel:  Recent Labs Lab 06/03/16 0027 06/03/16 0356 06/03/16 0358 06/03/16 1620 06/04/16 0000  NA 142 141 142 142 138  K 3.2* 3.3* 3.3* 3.8 4.2    CL 111 110 110 112* 108  CO2 22 23 24 24 24   GLUCOSE 256* 151* 143* 143* 315*  BUN 35* 33* 33* 26* 20  CREATININE 1.58* 1.38* 1.37* 1.17* 1.15*  CALCIUM 9.0 8.9 9.0 8.6* 8.5*  MG  --   --  2.1  --  1.9  PHOS  --   --  2.4*  --  2.3*   GFR: Estimated Creatinine Clearance: 67.3 mL/min (by C-G formula based on SCr of 1.15 mg/dL (H)).  Liver Function Tests:  Recent Labs Lab 06/02/16 1430 06/04/16 0000  AST 38 136*  ALT 34 145*  ALKPHOS 91 74  BILITOT 1.0 1.1  PROT 5.8* 5.6*  ALBUMIN 3.5 3.1*    Coagulation Profile:  Recent Labs Lab 06/04/16 0448  INR 1.05    Cardiac Enzymes:  Recent Labs Lab 06/02/16 1824 06/04/16 0000  TROPONINI 0.10* 0.16*    HbA1C: Hgb A1c MFr Bld  Date/Time Value Ref Range Status  06/02/2016 06:24 PM 5.5 4.8 - 5.6 % Final    Comment:    (NOTE)         Pre-diabetes: 5.7 - 6.4         Diabetes: >6.4         Glycemic control for adults with diabetes: <7.0     CBG:  Recent Labs Lab 06/03/16 1754 06/03/16 1849 06/03/16 1945 06/03/16 2332 06/04/16 0402  GLUCAP 128* 129* 168* 249* 198*    Recent Results (from the past 240 hour(s))  Culture, blood (Routine X 2) w Reflex to ID Panel     Status: None (Preliminary result)   Collection Time: 06/02/16  6:30 PM  Result Value Ref Range Status   Specimen Description BLOOD LEFT ANTECUBITAL  Final   Special Requests   Final    BOTTLES DRAWN AEROBIC AND ANAEROBIC BLOOD AEROBIC BOTTLE 10CC BLOOD ANAEROBIC BOTTLE 5CC   Culture NO GROWTH < 24 HOURS  Final   Report Status PENDING  Incomplete  Culture, blood (Routine X 2) w Reflex to ID Panel     Status: None (Preliminary result)   Collection Time: 06/02/16  6:40 PM  Result Value Ref Range Status   Specimen Description BLOOD LEFT HAND  Final   Special Requests BOTTLES DRAWN AEROBIC ONLY 10CC  Final   Culture NO GROWTH < 24 HOURS  Final   Report Status PENDING  Incomplete  MRSA PCR Screening     Status: None   Collection Time: 06/02/16   8:08 PM  Result Value Ref Range Status   MRSA by PCR NEGATIVE  NEGATIVE Final    Comment:        The GeneXpert MRSA Assay (FDA approved for NASAL specimens only), is one component of a comprehensive MRSA colonization surveillance program. It is not intended to diagnose MRSA infection nor to guide or monitor treatment for MRSA infections.      Scheduled Meds: . aspirin  81 mg Oral Daily  . heparin  5,000 Units Subcutaneous Q8H  . insulin aspart  0-15 Units Subcutaneous Q4H  . insulin glargine  10 Units Subcutaneous Daily  . levothyroxine  68.5 mcg Intravenous Daily  . mouth rinse  15 mL Mouth Rinse q12n4p  . pantoprazole (PROTONIX) IV  40 mg Intravenous Daily  . piperacillin-tazobactam (ZOSYN)  IV  3.375 g Intravenous Q8H  . sodium phosphate  Dextrose 5% IVPB  10 mmol Intravenous Once  . vancomycin  750 mg Intravenous Q12H   Continuous Infusions: . sodium chloride Stopped (06/02/16 2346)  . dextrose 5 % and 0.45% NaCl 75 mL/hr at 06/03/16 1800     LOS: 2 days   Cherene Altes, MD [facility] Office  [phone] Pager - Text Page per Amion as per below:  On-Call/Text Page:      [email or web address]      password [email or web address]  If 7PM-7AM, please contact night-coverage [email or web address] Password [email or web address] 06/04/2016, 7:46 AM

## 2016-06-04 NOTE — Progress Notes (Signed)
Paged [place] regarding pt CBG 249. RN will folllow orders and continue to monitor.  Arnell Sieving, RN

## 2016-06-04 NOTE — Progress Notes (Signed)
Report received from Gardiner Fanti, RN and patient admitted to [facility]. Patient oriented to room and call bell. Telemetry initiated and vital signs obtained. Patient still requiring 1L of oxygen via nasal canula. Skin intact. Will continue to monitor. Wyonia Hough

## 2016-06-04 NOTE — Progress Notes (Signed)
CRITICAL VALUE ALERT  Critical value received:  Troponin 0.16  Date of notification: 06/04/16  Time of notification:  0100  Critical value read back: yes  Nurse who received alert:  Arnell Sieving, RN   MD notified (1st page): Dr. Chase Caller  Time of first page:  0115  MD notified (2nd page): N/A  Time of second page: N/A  Responding MD:  Dr. Chase Caller  Time MD responded:  [phone]

## 2016-06-04 NOTE — Progress Notes (Signed)
[place] Physician Progress Note and Electrolyte Replacement  Patient Name: Julia Holt DOB: [birth year] MRN: [id number]  Date of Service  06/04/2016   HPI/Events of Note    Recent Labs Lab 06/03/16 0027 06/03/16 0356 06/03/16 0358 06/03/16 1620 06/04/16 0000  NA 142 141 142 142 138  K 3.2* 3.3* 3.3* 3.8 4.2  CL 111 110 110 112* 108  CO2 22 23 24 24 24   GLUCOSE 256* 151* 143* 143* 315*  BUN 35* 33* 33* 26* 20  CREATININE 1.58* 1.38* 1.37* 1.17* 1.15*  CALCIUM 9.0 8.9 9.0 8.6* 8.5*  MG  --   --  2.1  --  1.9  PHOS  --   --  2.4*  --  2.3*     Recent Labs Lab 06/02/16 1824 06/04/16 0000  TROPONINI 0.10* 0.16*     Estimated Creatinine Clearance: 67.3 mL/min (by C-G formula based on SCr of 1.15 mg/dL (H)).  Intake/Output      12/15 0701 - 12/16 0700   I.V. (mL/kg) 1545 (15.6)   Other 30   IV Piggyback 689.5   Total Intake(mL/kg) 2264.5 (22.8)   Urine (mL/kg/hr) 1130 (0.5)   Total Output 1130   Net +1134.5        - I/O DETAILED x 24h    Total I/O In: 670 [I.V.:450; Other:20; IV Piggyback:200] Out: 380 [Urine:380] - I/O THIS SHIFT    ASSESSMENT Gap closed bic normal  Mag < 2g Phos slight low  eICURN Interventions  Replete mag and phos   ASSESSMENT: MAJOR ELECTROLYTE      Dr. Brand Males, M.D., [facility].C.P Pulmonary and Critical Care Medicine Staff Physician [place] [facility] Pager: [phone], If no answer or between  15:00h - 7:00h: call [phone]  06/04/2016 1:38 AM

## 2016-06-04 NOTE — Progress Notes (Signed)
eMD note   Sugars 249mg %  Plan Change ssi q4h from sensitive to moderate Add lantus 10U Qam  Dr. Brand Males, M.D., [facility].C.P Pulmonary and Critical Care Medicine Staff Physician [place] [facility] Pager: [phone], If no answer or between  15:00h - 7:00h: call [phone]  06/04/2016 12:43 AM

## 2016-06-05 ENCOUNTER — Other Ambulatory Visit (HOSPITAL_COMMUNITY): Payer: BLUE CROSS/BLUE SHIELD

## 2016-06-05 ENCOUNTER — Inpatient Hospital Stay (HOSPITAL_COMMUNITY): Payer: BLUE CROSS/BLUE SHIELD

## 2016-06-05 DIAGNOSIS — E1101 Type 2 diabetes mellitus with hyperosmolarity with coma: Secondary | ICD-10-CM

## 2016-06-05 DIAGNOSIS — I10 Essential (primary) hypertension: Secondary | ICD-10-CM

## 2016-06-05 LAB — CBC
HCT: 36.1 % (ref 36.0–46.0)
HEMOGLOBIN: 12.3 g/dL (ref 12.0–15.0)
MCH: 32.5 pg (ref 26.0–34.0)
MCHC: 34.1 g/dL (ref 30.0–36.0)
MCV: 95.5 fL (ref 78.0–100.0)
PLATELETS: 176 10*3/uL (ref 150–400)
RBC: 3.78 MIL/uL — AB (ref 3.87–5.11)
RDW: 13.8 % (ref 11.5–15.5)
WBC: 10 10*3/uL (ref 4.0–10.5)

## 2016-06-05 LAB — GLUCOSE, CAPILLARY
GLUCOSE-CAPILLARY: 186 mg/dL — AB (ref 65–99)
GLUCOSE-CAPILLARY: 235 mg/dL — AB (ref 65–99)
GLUCOSE-CAPILLARY: 232 mg/dL — AB (ref 65–99)
Glucose-Capillary: 334 mg/dL — ABNORMAL HIGH (ref 65–99)

## 2016-06-05 LAB — COMPREHENSIVE METABOLIC PANEL
ALK PHOS: 90 U/L (ref 38–126)
ALT: 207 U/L — AB (ref 14–54)
ANION GAP: 11 (ref 5–15)
AST: 142 U/L — ABNORMAL HIGH (ref 15–41)
Albumin: 3.2 g/dL — ABNORMAL LOW (ref 3.5–5.0)
BUN: 13 mg/dL (ref 6–20)
CALCIUM: 8.6 mg/dL — AB (ref 8.9–10.3)
CO2: 24 mmol/L (ref 22–32)
CREATININE: 0.97 mg/dL (ref 0.44–1.00)
Chloride: 104 mmol/L (ref 101–111)
Glucose, Bld: 298 mg/dL — ABNORMAL HIGH (ref 65–99)
Potassium: 4.5 mmol/L (ref 3.5–5.1)
SODIUM: 139 mmol/L (ref 135–145)
Total Bilirubin: 2.4 mg/dL — ABNORMAL HIGH (ref 0.3–1.2)
Total Protein: 5.8 g/dL — ABNORMAL LOW (ref 6.5–8.1)

## 2016-06-05 LAB — FERRITIN: FERRITIN: 295 ng/mL (ref 11–307)

## 2016-06-05 LAB — FOLATE: Folate: 29.6 ng/mL (ref >5.9)

## 2016-06-05 LAB — VITAMIN B12: Vitamin B-12: 3108 pg/mL — ABNORMAL HIGH (ref 180–914)

## 2016-06-05 LAB — IRON AND TIBC
IRON: 40 ug/dL (ref 28–170)
Saturation Ratios: 20 % (ref 10.4–31.8)
TIBC: 202 ug/dL — AB (ref 250–450)
UIBC: 162 ug/dL

## 2016-06-05 LAB — RETICULOCYTES
RBC.: 3.78 MIL/uL — ABNORMAL LOW (ref 3.87–5.11)
RETIC CT PCT: 0.9 % (ref 0.4–3.1)
Retic Count, Absolute: 34 10*3/uL (ref 19.0–186.0)

## 2016-06-05 MED ORDER — INSULIN PUMP
SUBCUTANEOUS | Status: DC
  Administered 2016-06-06: 13:00:00 via SUBCUTANEOUS
  Filled 2016-06-05: qty 1

## 2016-06-05 MED ORDER — AZITHROMYCIN 500 MG PO TABS
500.0000 mg | ORAL_TABLET | Freq: Every day | ORAL | Status: DC
  Administered 2016-06-05 – 2016-06-06 (×2): 500 mg via ORAL
  Filled 2016-06-05 (×2): qty 1

## 2016-06-05 MED ORDER — MENTHOL 3 MG MT LOZG
1.0000 | LOZENGE | OROMUCOSAL | Status: DC | PRN
  Administered 2016-06-05: 3 mg via ORAL
  Filled 2016-06-05: qty 9

## 2016-06-05 MED ORDER — PNEUMOCOCCAL VAC POLYVALENT 25 MCG/0.5ML IJ INJ
0.5000 mL | INJECTION | INTRAMUSCULAR | Status: AC
  Administered 2016-06-06: 0.5 mL via INTRAMUSCULAR
  Filled 2016-06-05: qty 0.5

## 2016-06-05 MED ORDER — INSULIN GLARGINE 100 UNIT/ML ~~LOC~~ SOLN
15.0000 [IU] | Freq: Every day | SUBCUTANEOUS | Status: DC
  Administered 2016-06-06: 15 [IU] via SUBCUTANEOUS
  Filled 2016-06-05: qty 0.15

## 2016-06-05 MED ORDER — SENNA 8.6 MG PO TABS
2.0000 | ORAL_TABLET | Freq: Every day | ORAL | Status: DC
  Administered 2016-06-05: 17.2 mg via ORAL
  Filled 2016-06-05: qty 2

## 2016-06-05 MED ORDER — SODIUM CHLORIDE 0.9% FLUSH: 10.0000 mL | INTRAVENOUS | Status: DC | PRN

## 2016-06-05 NOTE — Progress Notes (Addendum)
PROGRESS NOTE [facility]   Julia Holt   [id number] DOB: 1961-05-15  DOA: 06/02/2016 PCP: Shellia Carwin, PA-C   Brief Narrative:  55 year old female with medical history of diabetes, hypertension and hypothyroidism was admitted for HONK. In the ED had persistent hypotension despite 4 L of IV fluid and subsequently she was intubated due to obtundation and not able to protect airway. Patient was treated with insulin drip, Levophed and IVF. Extubated on 12/15.   Subjective: Patient seen and examined with husband at bedside. Patient continues to be on nasal cannula due to shortness of breath. Denies chest pain and cough. Tolerating diet but complaining of feeling slightly weak when walking around.  Assessment & Plan: HONK DM - A1c 5.5 Of insulin drip - CBGs trending up, to suggest that patient was to start her insulin pump but she does not have all her equipment and no diabetic coordinator today for support. Continue Lantus increased to 15 units continue SSI and monitor CBGs. Otherwise stable On chart review seems that initial chest x-ray shows some signs of pneumonia likely the trigger off the hyperglycemia. Patient was treated with vancomycin and Zosyn and was discontinued on 12/16. Will start azithromycin given x-ray findings and oxygen requirements. Also on my exam she have diminished breath sounds at the left lower base clinically correlating with pneumonia. Diabetes coronary nectar has been consulted for starting insulin pump  CAP Azithromycin 500 mg for 3 days Status post intubation 12/14, extubated 12/15  Hypotension - resolved ECHO pending given increase in TNI which was likely 2/2 to demand ischemia   Hypertension -stable Continue current treatment  AKI Prerenal from hypovolemia - improved with IV fluids Monitor BMP  Hepatic Steatosis  Noted on CT abdom - LFTs normal initially then mildly elevated - recheck in AM - pt counseled on need for weight loss    Acute encephalopathy due to HONK  Hypothyroidism Cont home synthroid dose - TSH 1.871  Obesity - Body mass index is 35.3 kg/m.  DVT prophylaxis: subcutaneous heparin Code Status: FULL Family Communication: Husband at bedside Disposition Plan: Home in the next 24-48 hrs  Consultants:   PCCM   Procedures:   ECHO pending  Antimicrobials:  Zosyn 12/14-12/16  Vanco 12/14-12/16  Azithro 12/17 ->   Objective: Vitals:   06/04/16 1100 06/04/16 1201 06/04/16 2140 06/05/16 0441  BP: (!) 78/58 (!) 115/56 135/65 (!) 143/74  Pulse: 76 87 63 99  Resp: (!) 22 18 18 18   Temp:  99.1 F (37.3 C) 98.2 F (36.8 C) 98.8 F (37.1 C)  TempSrc:  Oral Oral Oral  SpO2: 92% 94% 100% 92%  Weight:  102.9 kg (226 lb 13.7 oz)    Height:  5\' 9"  (1.753 m)      Intake/Output Summary (Last 24 hours) at 06/05/16 1549 Last data filed at 06/05/16 0930  Gross per 24 hour  Intake              100 ml  Output             1950 ml  Net            -1850 ml   Filed Weights   06/02/16 1419 06/02/16 2000 06/04/16 1201  Weight: 81.6 kg (180 lb) 99.2 kg (218 lb 11.1 oz) 102.9 kg (226 lb 13.7 oz)    Examination:  General exam: Appears calm and comfortable  HEENT: Left IJ  Respiratory system: Good air entry decrease breath sounds LLB  Cardiovascular system: S1 &  S2 heard, RRR. No JVD, murmurs, rubs or gallops Gastrointestinal system: Abdomen is nondistended, soft and nontender. Central nervous system: Alert and oriented. Extremities: No pedal edema. Skin: No rashes, lesions or ulcers  Data Reviewed: I have personally reviewed following labs and imaging studies  CBC:  Recent Labs Lab 06/02/16 1430 06/03/16 0358 06/04/16 0448 06/05/16 0618  WBC 21.6* 18.3* 14.1* 10.0  NEUTROABS 18.1*  --  11.7*  --   HGB 11.9* 11.8* 11.7* 12.3  HCT 35.9* 34.7* 35.5* 36.1  MCV 98.1 93.0 96.2 95.5  PLT 246 235 178 0000000   Basic Metabolic Panel:  Recent Labs Lab 06/03/16 0356 06/03/16 0358  06/03/16 1620 06/04/16 0000 06/05/16 0618  NA 141 142 142 138 139  K 3.3* 3.3* 3.8 4.2 4.5  CL 110 110 112* 108 104  CO2 23 24 24 24 24   GLUCOSE 151* 143* 143* 315* 298*  BUN 33* 33* 26* 20 13  CREATININE 1.38* 1.37* 1.17* 1.15* 0.97  CALCIUM 8.9 9.0 8.6* 8.5* 8.6*  MG  --  2.1  --  1.9  --   PHOS  --  2.4*  --  2.3*  --    GFR: Estimated Creatinine Clearance: 85.7 mL/min (by C-G formula based on SCr of 0.97 mg/dL). Liver Function Tests:  Recent Labs Lab 06/02/16 1430 06/04/16 0000 06/05/16 0618  AST 38 136* 142*  ALT 34 145* 207*  ALKPHOS 91 74 90  BILITOT 1.0 1.1 2.4*  PROT 5.8* 5.6* 5.8*  ALBUMIN 3.5 3.1* 3.2*   No results for input(s): LIPASE, AMYLASE in the last 168 hours. No results for input(s): AMMONIA in the last 168 hours. Coagulation Profile:  Recent Labs Lab 06/04/16 0448  INR 1.05   Cardiac Enzymes:  Recent Labs Lab 06/02/16 1824 06/04/16 0000 06/04/16 1030  TROPONINI 0.10* 0.16* 0.10*   BNP (last 3 results) No results for input(s): PROBNP in the last 8760 hours. HbA1C:  Recent Labs  06/02/16 1824  HGBA1C 5.5   CBG:  Recent Labs Lab 06/04/16 1158 06/04/16 1722 06/04/16 2201 06/05/16 0750 06/05/16 1307  GLUCAP 188* 244* 328* 334* 186*   Lipid Profile: No results for input(s): CHOL, HDL, LDLCALC, TRIG, CHOLHDL, LDLDIRECT in the last 72 hours. Thyroid Function Tests:  Recent Labs  06/02/16 1824  TSH 1.871   Anemia Panel:  Recent Labs  06/05/16 0618  VITAMINB12 3,108*  FOLATE 29.6  FERRITIN 295  TIBC 202*  IRON 40  RETICCTPCT 0.9   Sepsis Labs:  Recent Labs Lab 06/02/16 1824 06/02/16 2003 06/03/16 1012 06/03/16 1017 06/04/16 0000  PROCALCITON 12.22  --  8.18  --  5.56  LATICACIDVEN 5.8* 4.4*  --  1.2 0.9    Recent Results (from the past 240 hour(s))  Culture, blood (Routine X 2) w Reflex to ID Panel     Status: None (Preliminary result)   Collection Time: 06/02/16  6:30 PM  Result Value Ref Range Status    Specimen Description BLOOD LEFT ANTECUBITAL  Final   Special Requests   Final    BOTTLES DRAWN AEROBIC AND ANAEROBIC BLOOD AEROBIC BOTTLE 10CC BLOOD ANAEROBIC BOTTLE 5CC   Culture NO GROWTH 3 DAYS  Final   Report Status PENDING  Incomplete  Culture, blood (Routine X 2) w Reflex to ID Panel     Status: None (Preliminary result)   Collection Time: 06/02/16  6:40 PM  Result Value Ref Range Status   Specimen Description BLOOD LEFT HAND  Final   Special Requests BOTTLES  DRAWN AEROBIC ONLY 10CC  Final   Culture NO GROWTH 3 DAYS  Final   Report Status PENDING  Incomplete  MRSA PCR Screening     Status: None   Collection Time: 06/02/16  8:08 PM  Result Value Ref Range Status   MRSA by PCR NEGATIVE NEGATIVE Final    Comment:        The GeneXpert MRSA Assay (FDA approved for NASAL specimens only), is one component of a comprehensive MRSA colonization surveillance program. It is not intended to diagnose MRSA infection nor to guide or monitor treatment for MRSA infections.       Radiology Studies: Dg Chest Port 1 View  Result Date: 06/04/2016 CLINICAL DATA:  ET tube EXAM: PORTABLE CHEST 1 VIEW COMPARISON:  06/03/2016 FINDINGS: Interval removal of endotracheal tube and NG tube. Left central line remains in place, unchanged. There is cardiomegaly with vascular congestion and bilateral airspace opacities, slightly worsened since prior study, likely worsening edema. No definite effusions. IMPRESSION: Mild worsening in bilateral airspace disease, likely worsening edema. Electronically Signed   By: Rolm Baptise M.D.   On: 06/04/2016 07:29     Scheduled Meds: . aspirin  81 mg Oral Daily  . azithromycin  500 mg Oral Daily  . heparin  5,000 Units Subcutaneous Q8H  . insulin aspart  0-15 Units Subcutaneous TID WC  . insulin aspart  0-5 Units Subcutaneous QHS  . [START ON 06/06/2016] insulin glargine  15 Units Subcutaneous Daily  . insulin pump   Subcutaneous Q4H  . levothyroxine  137 mcg  Oral QAC breakfast  . mouth rinse  15 mL Mouth Rinse q12n4p   Continuous Infusions:   LOS: 3 days    Chipper Oman, MD [facility] Pager [phone]  If 7PM-7AM, please contact night-coverage [email or web address] Password [facility] 06/05/2016, 3:49 PM

## 2016-06-05 NOTE — Progress Notes (Signed)
Patient ambulated to and from restroom on room air and oxygen saturation maintained above 93%. Will leave off oxygen now and continue to monitor. Julia Holt

## 2016-06-05 NOTE — Progress Notes (Signed)
Inpatient Diabetes Program Recommendations  AACE/ADA: New Consensus Statement on Inpatient Glycemic Control (2015)  Target Ranges:  Prepandial:   less than 140 mg/dL      Peak postprandial:   less than 180 mg/dL (1-2 hours)      Critically ill patients:  140 - 180 mg/dL   Lab Results  Component Value Date   GLUCAP 235 (H) 06/05/2016   HGBA1C 5.5 06/02/2016    Review of Glycemic Control  Received consult for "Insulin Pump" at 1048.  Diabetes history: DM1 Outpatient Diabetes medications: insulin pump Current orders for Inpatient glycemic control: Lantus 15 units QD (to begin 12/18 am), Novolog 0-15 units tidwc and hs  Pt in DKA on admission. On GlucoStabilizer and transitioned off drip at [facility] on 12/15. Did not receive Lantus 10 units until 12/16 at 0913.   From MD notes dated 03/18/16 her rates are as follows: 12 am-0.75, 2am-0.7, 6 am-1.0, 9 am-1.0, 1130 am-0.775, 3 pm-0.725, and 9 pm-0.625. Her insulin to carb ratio is 1:12 in am, 1:17 at lunch and 1:12 with supper. Her sensitivity is 1:50. Her goal is 100-105. Patient's total daily basal rate is 18 units    Inpatient Diabetes Program Recommendations:    Add meal coverage insulin - Novolog 3 units tidwc Resume pump after safety check completed by pt and Diabetes Coordinator has met with pt.  Pt will need to do safety check with pump company prior to starting her insulin pump. Toll-free phone number on back of pump. Need to address issue of insulin pump with no insulin as pt ?neglected to fill pump. (Did pump alarm sound??) Is pt monitoring blood sugars at home. Per MD note, pt thought blood sugars were low and took glucose tabs and actually it was high - 878 mg/dL.  Diabetes Coordinator to f/u in am and meet with pt.  Thank you. Lorenda Peck, RD, LDN, CDE Inpatient Diabetes Coordinator [phone]

## 2016-06-06 LAB — GLUCOSE, CAPILLARY
Glucose-Capillary: 238 mg/dL — ABNORMAL HIGH (ref 65–99)
Glucose-Capillary: 223 mg/dL — ABNORMAL HIGH (ref 65–99)
Glucose-Capillary: 133 mg/dL — ABNORMAL HIGH (ref 65–99)

## 2016-06-06 LAB — CBC
HEMATOCRIT: 33 % — AB (ref 36.0–46.0)
Hemoglobin: 11.4 g/dL — ABNORMAL LOW (ref 12.0–15.0)
MCH: 32.3 pg (ref 26.0–34.0)
MCHC: 34.5 g/dL (ref 30.0–36.0)
MCV: 93.5 fL (ref 78.0–100.0)
Platelets: 163 10*3/uL (ref 150–400)
RBC: 3.53 MIL/uL — ABNORMAL LOW (ref 3.87–5.11)
RDW: 13.5 % (ref 11.5–15.5)
WBC: 8.3 10*3/uL (ref 4.0–10.5)

## 2016-06-06 LAB — BASIC METABOLIC PANEL
ANION GAP: 11 (ref 5–15)
BUN: 9 mg/dL (ref 6–20)
CHLORIDE: 104 mmol/L (ref 101–111)
CO2: 26 mmol/L (ref 22–32)
Calcium: 8.4 mg/dL — ABNORMAL LOW (ref 8.9–10.3)
Creatinine, Ser: 0.86 mg/dL (ref 0.44–1.00)
GFR calc Af Amer: >60 mL/min (ref >60)
GLUCOSE: 240 mg/dL — AB (ref 65–99)
POTASSIUM: 3.7 mmol/L (ref 3.5–5.1)
Sodium: 141 mmol/L (ref 135–145)

## 2016-06-06 LAB — ECHOCARDIOGRAM COMPLETE
Height: 69 in
Weight: 3629.65 oz

## 2016-06-06 MED ORDER — ASPIRIN 81 MG PO CHEW: 81.0000 mg | CHEWABLE_TABLET | Freq: Every day | ORAL | Status: AC

## 2016-06-06 MED ORDER — AZITHROMYCIN 500 MG PO TABS: 500.0000 mg | ORAL_TABLET | Freq: Every day | ORAL | 0 refills | Status: AC

## 2016-06-06 NOTE — Progress Notes (Signed)
Nsg Discharge Note  Admit Date:  06/02/2016 Discharge date: 06/06/2016   LATREVA BARTNIK to be D/C'd home per MD order.  AVS completed.  Copy for chart, and copy for patient signed, and dated. Patient/caregiver able to verbalize understanding.  Discharge Medication: Allergies as of 06/06/2016      Reactions   Sulfa Antibiotics Hives, Itching      Medication List    STOP taking these medications   ibuprofen 400 MG tablet Commonly known as:  ADVIL,MOTRIN     TAKE these medications   amLODipine 10 MG tablet Commonly known as:  NORVASC Take 10 mg by mouth daily.   aspirin 81 MG chewable tablet Chew 1 tablet (81 mg total) by mouth daily. Start taking on:  06/07/2016   azithromycin 500 MG tablet Commonly known as:  ZITHROMAX Take 1 tablet (500 mg total) by mouth daily. Start taking on:  06/07/2016   glucose blood test strip 1 each by Other route as needed for other. Use as instructed   insulin pump Soln Inject into the skin. Husband has no idea what kind of insulin she takes. Husband states she is on a insulin pump. Pharmacy has a record of Novolog 60 units twice daily.   levothyroxine 137 MCG tablet Commonly known as:  SYNTHROID, LEVOTHROID Take 137 mcg by mouth daily before breakfast.   quinapril 20 MG tablet Commonly known as:  ACCUPRIL Take 20 mg by mouth at bedtime.   spironolactone 25 MG tablet Commonly known as:  ALDACTONE Take 25 mg by mouth daily.       Discharge Assessment: Vitals:   06/06/16 1402 06/06/16 1900  BP: (!) 141/68 (!) 151/77  Pulse: 83 75  Resp: 14 16  Temp: 98.6 F (37 C) 98.4 F (36.9 C)   Skin clean, dry and intact without evidence of skin break down, no evidence of skin tears noted. IV catheter discontinued intact. Site without signs and symptoms of complications - no redness or edema noted at insertion site, patient denies c/o pain - only slight tenderness at site.  Dressing with slight pressure applied.  D/c  Instructions-Education: Discharge instructions given to patient/family with verbalized understanding. D/c education completed with patient/family including follow up instructions, medication list, d/c activities limitations if indicated, with other d/c instructions as indicated by MD - patient able to verbalize understanding, all questions fully answered. Patient instructed to return to ED, call 911, or call MD for any changes in condition.  Patient escorted via [place], and D/C home via private auto.  Wyonia Hough, RN 06/06/2016 7:01 PM

## 2016-06-06 NOTE — Progress Notes (Signed)
Pt's HR went up to 180's,while pt.went to the BR.the patient. Is asymptomatic only c/o SOB on exertion.Wiill cont.to monitor pt.

## 2016-06-06 NOTE — Progress Notes (Signed)
Instructed pt on procedure. HOB less than 45 degrees. Pt held breath during line removal. Held pressure 5+min. Instructed pt to remain in bed for 30 min and to monitor and report any s/sx of bleeding or other complications. Instructed to leave pressure drsg CDI for 24 hours. VU. Fran Lowes, RN VAST

## 2016-06-06 NOTE — Care Management Note (Signed)
Case Management Note  Patient Details  Name: Julia Holt MRN: [id number] Date of Birth: 17-Aug-1960  Subjective/Objective:                 Patient transferred from ICU over the weekend for DKA, was intubated. Patient has insulin pump prior to admission. Lives at home with spouse. PCP Shanna Cisco   Action/Plan:  Anticipate DC to home with husband today.  Expected Discharge Date:                  Expected Discharge Plan:  Home/Self Care  In-House Referral:     Discharge planning Services  CM Consult  Post Acute Care Choice:    Choice offered to:     DME Arranged:    DME Agency:     HH Arranged:    [place] Agency:     Status of Service:  Completed, signed off  If discussed at [employer] of Stay Meetings, dates discussed:    Additional Comments:  Carles Collet, RN 06/06/2016, 11:29 AM

## 2016-06-06 NOTE — Progress Notes (Signed)
Tele notified Ginger, G. Charge RN that patient's HR increased to the 160s. She stated she checked on the patient who was asymptomatic, and then notified the doctor. No new orders. Will continue to monitor. Wyonia Hough

## 2016-06-06 NOTE — Discharge Instructions (Signed)
Diabetes Mellitus and Sick Day Management Blood sugar (glucose) can be difficult to control when you are sick. Common illnesses that can cause problems for people with diabetes (diabetes mellitus) include colds, fever, flu (influenza), nausea, vomiting, and diarrhea. These illnesses can cause stress and loss of body fluids (dehydration), and those issues can cause blood glucose levels to increase. Because of this, it is very important to take your insulin and diabetes medicines and eat some form of carbohydrate when you are sick. You should make a plan for days when you are sick (sick day plan) as part of your diabetes management plan. You and your health care provider should make this plan in advance. The following guidelines are intended to help you manage an illness that lasts for about 24 hours or less. Your health care provider may also give you more specific instructions. What do I need to do to manage my blood glucose?  Check your blood glucose every 2-4 hours, or as often as told by your health care provider.  Know your sick day treatment goals. Your target blood glucose levels may be different when you are sick.  If you use insulin, take your usual dose.  If your blood glucose continues to be too high, you may need to take an additional insulin dose as told by your health care provider.  If you use oral diabetes medicine, you may need to stop taking it if you are not able to eat or drink normally. Ask your health care provider about whether you need to stop taking these medicines while you are sick.  If you use injectable hormone medicines other than insulin to control your diabetes, ask your health care provider about whether you need to stop taking these medicines while you are sick. What else can I do to manage my diabetes when I am sick? Check your ketones  If you have type 1 diabetes, check your urine ketones every 4 hours.  If you have type 2 diabetes, check your urine ketones as  often as told by your health care provider. Drink fluids  Drink enough fluid to keep your urine clear or pale yellow. This is especially important if you have a fever, vomiting, or diarrhea. Those symptoms can lead to dehydration.  Follow any instructions from your health care provider about beverages to avoid.  Do not drink alcohol, caffeine, or drinks that contain a lot of sugar. Take medicines as directed  Take-over-the-counter and prescription medicines only as told by your health care provider.  Check medicine labels for added sugars. Some medicines may contain sugar or types of sugars that can raise your blood glucose level. What foods can I eat when I am sick? You need to eat some form of carbohydrates when you are sick. You should eat 45-50 grams (45-50 g) of carbohydrates every 3-4 hours until you feel better. All of the food choices below contain about 15 g of carbohydrates. Plan ahead and keep some of these foods around so you have them if you get sick.  4-6 oz (120-177 mL) carbonated beverage that contains sugar, such as regular (not diet) soda. You may be able to drink carbonated beverages more easily if you open the beverage and let it sit at room temperature for a few minutes before drinking.   of a twin frozen ice pop.  4 oz (120 g) regular gelatin.  4 oz (120 mL) fruit juice.  4 oz (120 g) ice cream or frozen yogurt.  2 oz (60  g) sherbet.  8 oz (240 mL) clear broth or soup.  4 oz (120 g) regular custard.  4 oz (120 g) regular pudding.  8 oz (240 g) plain yogurt.  1 slice bread or toast.  6 saltine crackers.  5 vanilla wafers. Questions to ask your health care provider Consider asking the following questions so you know what to do on days when you are sick:  Should I adjust my diabetes medicines?  How often do I need to check my blood glucose?  What supplies do I need to manage my diabetes at home when I am sick?  What number can I call if I have  questions?  What foods and drinks should I avoid? Contact a health care provider if:  You develop symptoms of diabetic ketoacidosis, such as:  Fatigue.  Weight loss.  Excessive thirst.  Light-headedness.  Fruity or sweet-smelling breath.  Excessive urination.  Vision changes.  Confusion or irritability.  Nausea.  Vomiting.  Rapid breathing.  Pain in the abdomen.  Feeling flushed.  You are unable to drink fluids without vomiting.  You have any of the following for more than 6 hours:  Nausea.  Vomiting.  Diarrhea.  Your blood glucose is at or above 240 mg/dL (13.3 mmol/L), even after you take an additional insulin dose.  You have a change in how you think, feel, or act (mental status).  You develop another serious illness.  You have been sick or have had a fever for 2 days or longer and you are not getting better. Get help right away if:  Your blood glucose is lower than 54 mg/dL (3.0 mmol/L).  You have difficulty breathing.  You have moderate or high ketone levels in your urine.  You used emergency glucagon to treat low blood glucose. Summary  Blood sugar (glucose) can be difficult to control when you are sick. Common illnesses that can cause problems for people with diabetes (diabetes mellitus) include colds, fever, flu (influenza), nausea, vomiting, and diarrhea.  Illnesses can cause stress and loss of body fluids (dehydration), and those issues can cause blood glucose levels to increase.  Make a plan for days when you are sick (sick day plan) as part of your diabetes management plan. You and your health care provider should make this plan in advance.  It is very important to take your insulin and diabetes medicines and to eat some form of carbohydrate when you are sick.  Contact your health care provider if have problems managing your blood glucose levels when you are sick, or if you have been sick or had a fever for 2 days or longer and are not  getting better. This information is not intended to replace advice given to you by your health care provider. Make sure you discuss any questions you have with your health care provider. Document Released: 06/09/2003 Document Revised: 03/04/2016 Document Reviewed: 03/04/2016 Elsevier Interactive Patient Education  2017 [place].   Diabetes Mellitus and Skin Care Diabetes (diabetes mellitus) can lead to health problems over time, including skin problems. People with diabetes have a higher risk for many types of skin complications. This is because having poorly controlled blood sugar (glucose) levels can:  Damage nerves and blood vessels. This can result in decreased feeling in your legs and feet, which means you may not notice minor skin injuries that could lead to serious problems.  Reduce blood flow (circulation), which makes wounds heal more slowly and increases your risk of infection.  Cause areas  of skin to become thick or discolored. What are some common skin conditions that affect people with diabetes? Diabetes often causes dry skin. It can also cause the skin on the feet to get thinner, break more easily, and heal more slowly. There are certain skin conditions that commonly affect people who have diabetes, such as:  Bacterial skin infections, such as styes, boils, infected hair follicles, and infections of the skin around the nails.  Fungal skin infections. These are most common in areas where skin rubs together, such as in the armpits or under the breasts.  Open sores, especially on the feet.  Tissue death (gangrene). This can happen on your feet if a serious infection does not heal properly. Gangrene can cause the need for a foot or leg to be surgically removed (amputated). Diabetes can also cause the skin to change. You may develop:  Dark, velvety markings on the skin that usually appear on the face, neck, armpits, inner thighs, and groin (acanthosis nigricans). This  typically affects people of African-American and American-Indian descent.  Red, raised, scar-like tissue that may itch, feel painful, or develop into a wound (necrobiosis lipoidica).  Blisters on feet, toes, hands, or fingers.  Thickened, wax-like areas of skin that usually occur on the hands, forehead, or toes (digital sclerosis).  Brown or red ring-shaped or half-ring-shaped patches of skin on the ears or fingers (disseminated granuloma).  Pea-shaped yellow bumps that may be itchy and surrounded by a red ring (eruptive xanthomatosis). This usually affects the arms, feet, buttocks, and the top of the hands.  Round, discolored patches of tan skin that do not hurt or itch (diabetic dermopathy). These may look like age spots. What do I need to know about itchy skin? It is common for people with diabetes to have itchy skin caused by dryness. Frequent high blood glucose levels can cause itchiness, and poor circulation and certain skin infections can make dry, itchy skin worse. If you have itchy skin that is red or covered in a rash, this could be a sign of an allergic reaction to a medicine. If you have a rash or if your skin is very itchy, contact your health care provider. You may need help to manage your diabetes better, or you may need treatment for an infection. How can I prevent skin breakdown? When you have diabetes and you get a badly infected ulcer or sore that does not heal, your skin can break down, especially if you have poor circulation or are on bed rest. To prevent skin breakdown:  Keep your skin clean and dry. Wash your skin often. Do not use hot water.  Do not use any products that contain nicotine or tobacco, such as cigarettes and e-cigarettes. Smoking affects the bodys ability to heal. If you need help quitting, ask your health care provider.  Check your skin every day for cuts, bruises, redness, blisters, or sores, especially on your feet. Tell your health care provider about  any cuts, wounds, or sores you have, especially if they are healing slowly.  If you are on bed rest, try to change positions often. What else do I need to know about taking care of my skin?   To relieve dry skin and itching:  Limit baths and showers to 5-10 minutes.  Bathe with lukewarm water instead of hot water.  Use mild soap and gentle skin cleansers. Do not use soap that is perfumed or harsh or dries your skin.  Put on lotion as soon as you  finish bathing.  Make sure that your health care provider performs a visual foot exam at every medical visit.  Schedule a foot exam with your health care provider once every year. This exam includes an inspection of the structure and skin of your feet.  If you get a skin injury, such as a cut, blister, or sore, check the area every day for signs of infection. Check for:  More redness, swelling, or pain.  More fluid or blood.  Warmth.  Pus or a bad smell. Contact a health care provider if:  You develop a cut or sore, especially on your feet.  You develop signs of infection after a skin injury.  Your blood glucose level is higher than 240 mg/dL (13.3 mmol/L) for 2 days in a row.  You have itchy skin that develops redness or a rash.  You have discolored areas of skin.  You have areas where your skin is changing, such as thickening or appearing shiny. This information is not intended to replace advice given to you by your health care provider. Make sure you discuss any questions you have with your health care provider. Document Released: 11/17/2015 Document Revised: 12/25/2015 Document Reviewed: 11/17/2015 Elsevier Interactive Patient Education  2017 [place].  Diabetes Mellitus and Food It is important for you to manage your blood sugar (glucose) level. Your blood glucose level can be greatly affected by what you eat. Eating healthier foods in the appropriate amounts throughout the day at about the same time each day will help  you control your blood glucose level. It can also help slow or prevent worsening of your diabetes mellitus. Healthy eating may even help you improve the level of your blood pressure and reach or maintain a healthy weight. General recommendations for healthful eating and cooking habits include:  Eating meals and snacks regularly. Avoid going long periods of time without eating to lose weight.  Eating a diet that consists mainly of plant-based foods, such as fruits, vegetables, nuts, legumes, and whole grains.  Using low-heat cooking methods, such as baking, instead of high-heat cooking methods, such as deep frying. Work with your dietitian to make sure you understand how to use the Nutrition Facts information on food labels. How can food affect me? Carbohydrates  Carbohydrates affect your blood glucose level more than any other type of food. Your dietitian will help you determine how many carbohydrates to eat at each meal and teach you how to count carbohydrates. Counting carbohydrates is important to keep your blood glucose at a healthy level, especially if you are using insulin or taking certain medicines for diabetes mellitus. Alcohol  Alcohol can cause sudden decreases in blood glucose (hypoglycemia), especially if you use insulin or take certain medicines for diabetes mellitus. Hypoglycemia can be a life-threatening condition. Symptoms of hypoglycemia (sleepiness, dizziness, and disorientation) are similar to symptoms of having too much alcohol. If your health care provider has given you approval to drink alcohol, do so in moderation and use the following guidelines:  Women should not have more than one drink per day, and men should not have more than two drinks per day. One drink is equal to:  12 oz of beer.  5 oz of wine.  1 oz of hard liquor.  Do not drink on an empty stomach.  Keep yourself hydrated. Have water, diet soda, or unsweetened iced tea.  Regular soda, juice, and other  mixers might contain a lot of carbohydrates and should be counted. What foods are not recommended?  As you make food choices, it is important to remember that all foods are not the same. Some foods have fewer nutrients per serving than other foods, even though they might have the same number of calories or carbohydrates. It is difficult to get your body what it needs when you eat foods with fewer nutrients. Examples of foods that you should avoid that are high in calories and carbohydrates but low in nutrients include:  Trans fats (most processed foods list trans fats on the Nutrition Facts label).  Regular soda.  Juice.  Candy.  Sweets, such as cake, pie, doughnuts, and cookies.  Fried foods. What foods can I eat? Eat nutrient-rich foods, which will nourish your body and keep you healthy. The food you should eat also will depend on several factors, including:  The calories you need.  The medicines you take.  Your weight.  Your blood glucose level.  Your blood pressure level.  Your cholesterol level. You should eat a variety of foods, including:  Protein.  Lean cuts of meat.  Proteins low in saturated fats, such as fish, egg whites, and beans. Avoid processed meats.  Fruits and vegetables.  Fruits and vegetables that may help control blood glucose levels, such as apples, mangoes, and yams.  Dairy products.  Choose fat-free or low-fat dairy products, such as milk, yogurt, and cheese.  Grains, bread, pasta, and rice.  Choose whole grain products, such as multigrain bread, whole oats, and brown rice. These foods may help control blood pressure.  Fats.  Foods containing healthful fats, such as nuts, avocado, olive oil, canola oil, and fish. Does everyone with diabetes mellitus have the same meal plan? Because every person with diabetes mellitus is different, there is not one meal plan that works for everyone. It is very important that you meet with a dietitian who will  help you create a meal plan that is just right for you. This information is not intended to replace advice given to you by your health care provider. Make sure you discuss any questions you have with your health care provider. Document Released: 03/03/2005 Document Revised: 11/12/2015 Document Reviewed: 05/03/2013 Elsevier Interactive Patient Education  2017 [place].   Diabetes and Foot Care Diabetes may cause you to have problems because of poor blood supply (circulation) to your feet and legs. This may cause the skin on your feet to become thinner, break easier, and heal more slowly. Your skin may become dry, and the skin may peel and crack. You may also have nerve damage in your legs and feet causing decreased feeling in them. You may not notice minor injuries to your feet that could lead to infections or more serious problems. Taking care of your feet is one of the most important things you can do for yourself. Follow these instructions at home:  Wear shoes at all times, even in the house. Do not go barefoot. Bare feet are easily injured.  Check your feet daily for blisters, cuts, and redness. If you cannot see the bottom of your feet, use a mirror or ask someone for help.  Wash your feet with warm water (do not use hot water) and mild soap. Then pat your feet and the areas between your toes until they are completely dry. Do not soak your feet as this can dry your skin.  Apply a moisturizing lotion or petroleum jelly (that does not contain alcohol and is unscented) to the skin on your feet and to dry, brittle toenails. Do not  apply lotion between your toes.  Trim your toenails straight across. Do not dig under them or around the cuticle. File the edges of your nails with an emery board or nail file.  Do not cut corns or calluses or try to remove them with medicine.  Wear clean socks or stockings every day. Make sure they are not too tight. Do not wear knee-high stockings since they  may decrease blood flow to your legs.  Wear shoes that fit properly and have enough cushioning. To break in new shoes, wear them for just a few hours a day. This prevents you from injuring your feet. Always look in your shoes before you put them on to be sure there are no objects inside.  Do not cross your legs. This may decrease the blood flow to your feet.  If you find a minor scrape, cut, or break in the skin on your feet, keep it and the skin around it clean and dry. These areas may be cleansed with mild soap and water. Do not cleanse the area with peroxide, alcohol, or iodine.  When you remove an adhesive bandage, be sure not to damage the skin around it.  If you have a wound, look at it several times a day to make sure it is healing.  Do not use heating pads or hot water bottles. They may burn your skin. If you have lost feeling in your feet or legs, you may not know it is happening until it is too late.  Make sure your health care provider performs a complete foot exam at least annually or more often if you have foot problems. Report any cuts, sores, or bruises to your health care provider immediately. Contact a health care provider if:  You have an injury that is not healing.  You have cuts or breaks in the skin.  You have an ingrown nail.  You notice redness on your legs or feet.  You feel burning or tingling in your legs or feet.  You have pain or cramps in your legs and feet.  Your legs or feet are numb.  Your feet always feel cold. Get help right away if:  There is increasing redness, swelling, or pain in or around a wound.  There is a red line that goes up your leg.  Pus is coming from a wound.  You develop a fever or as directed by your health care provider.  You notice a bad smell coming from an ulcer or wound. This information is not intended to replace advice given to you by your health care provider. Make sure you discuss any questions you have with your  health care provider. Document Released: 06/03/2000 Document Revised: 11/12/2015 Document Reviewed: 11/13/2012 Elsevier Interactive Patient Education  2017 [employer]   Type 1 Diabetes Mellitus, Diagnosis, Adult Type 1 diabetes (type 1 diabetes mellitus) is a long-term (chronic) disease. It occurs when the pancreas does not make enough of a hormone called insulin. Normally, insulin allows sugars (glucose) to enter cells in the body. The cells use glucose for energy. Lack of insulin causes excess glucose to build up in the blood instead of going into cells. As a result, high blood glucose (hyperglycemia) develops. The exact cause of type 1 diabetes is not known. There is currently no cure for type 1 diabetes, but it can be managed with insulin treatment and lifestyle changes. What increases the risk? You may be more likely to develop this condition if you have a family  member who has type 1 diabetes. Other factors may also make you more likely to develop type 1 diabetes, such as:  Having a gene for type 1 diabetes that is passed along from parent to child (inherited).  Living in an area with cold weather conditions.  Exposure to certain viruses.  Certain conditions in which the body's disease-fighting (immune) system attacks itself (autoimmune disorders). What are the signs or symptoms? Symptoms may develop gradually, over days or weeks, or they may develop suddenly. Symptoms may include:  Increased thirst (polydipsia).  Increased hunger(polyphagia).  Increased urination (polyuria).  Increased urination during the night (nocturia).  Sudden or unexplained weight changes.  Frequent infections that keep coming back (recurring).  Fatigue.  Weakness.  Vision changes, such as blurry vision.  Fruity-smelling breath.  Cuts or bruises that are slow to heal.  Tingling or numbness in the hands or feet. How is this diagnosed?   This condition is diagnosed based on your  symptoms, your medical history, a physical exam, and your blood glucose level. Your blood glucose may be checked with one or more of the following blood tests:  A fasting blood glucose (FBG) test. You will not be allowed to eat (you will fast) for at least 8 hours before a blood sample is taken.  A random blood glucose test. This checks blood glucose at any time of day regardless of when you ate.  An A1c (hemoglobin A1c) blood test. This provides information about blood glucose control over the previous 2-3 months. You may be diagnosed with type 1 diabetes if:  Your FBG level is 126 mg/dL (7.0 mmol/L) or higher.  Your random blood glucose level is 200 mg/dL (11.1 mmol/L) or higher.  Your A1c level is 6.5% or higher. These blood tests may be repeated to confirm your diagnosis. How is this treated? Your treatment may be managed by a specialist called an endocrinologist. Type 1 diabetes can be managed by following instructions from your health care provider about:  Taking insulin daily. This helps to keep your blood glucose levels in the healthy range.  You may need to adjust your insulin dosage based on how physically active you are and what foods you eat. Your health care provider will tell you how to do this.  Taking medicines to help prevent complications from diabetes, such as:  Aspirin.  Medicine to lower cholesterol.  Medicine to control blood pressure.  Checking your blood glucose as often as directed.  Making diet and lifestyle changes. These may include:  Following an individualized nutrition plan that is developed by a diet and nutrition specialist (registered dietitian).  Exercising regularly.  Finding ways to manage stress. Your health care provider will set individualized treatment goals for you. Your goals will be based on your age, other medical conditions you have, and how you respond to diabetes treatment. Generally, the goal of treatment is to maintain the  following blood glucose levels:  Before meals (preprandial): 80-130 mg/dL (4.4-7.2 mmol/L).  After meals (postprandial): below 180 mg/dL (10 mmol/L).  A1c level: less than 7%. Follow these instructions at home: Questions to [place] Provider   Consider asking the following questions:  Do I need to meet with a diabetes educator?  Should I consider joining a support group for people with diabetes?  What equipment will I need to manage my diabetes at home?  What diabetes medicines should I take, and when?  How often should I check my blood glucose?  What number should  I call if I have questions?  When is my next appointment? General instructions  Take over-the-counter and prescription medicines only as told by your health care provider.  Keep all follow-up visits as told by your health care provider. This is important.  For more information about diabetes, visit:  American Diabetes Association (ADA): www.diabetes.org  American Association of Diabetes Educators (AADE): www.diabeteseducator.org/patient-resources Contact a health care provider if:  Your blood glucose level is higher than 240 mg/dL (13.3 mmol/L) for 2 days in a row.  You have been sick or have had a fever for 2 days or more and you are not getting better.  You have any of the following problems for more than 6 hours:  You cannot eat or drink.  You have nausea and vomiting.  You have diarrhea. Get help right away if:  Your blood glucose is below 54 mg/dL (3 mmol/L).  You become confused or you have trouble thinking clearly.  You have difficulty breathing.  You have moderate or large ketone levels in your urine. This information is not intended to replace advice given to you by your health care provider. Make sure you discuss any questions you have with your health care provider. Document Released: 06/03/2000 Document Revised: 11/12/2015 Document Reviewed: 07/10/2015 Elsevier Interactive  Patient Education  2017 [place].   How to Avoid Diabetes Mellitus Problems You can take action to prevent or slow down problems that are caused by diabetes (diabetes mellitus). Following your diabetes plan and taking care of yourself can reduce your risk of serious or life-threatening complications. Manage your diabetes  Follow instructions from your health care providers about managing your diabetes. Your diabetes may be managed by a team of health care providers who can teach you how to care for yourself and can answer questions that you have.  Educate yourself about your condition so you can make healthy choices about eating and physical activity.  Check your blood sugar (glucose) levels as often as directed. Your health care provider will help you decide how often to check your blood glucose level depending on your treatment goals and how well you are meeting them.  Ask your health care provider if you should take low-dose aspirin daily and what dose is recommended for you. Taking low-dose aspirin daily is recommended to help prevent cardiovascular disease. Do not use nicotine or tobacco Do not use any products that contain nicotine or tobacco, such as cigarettes and e-cigarettes. If you need help quitting, ask your health care provider. Nicotine raises your risk for diabetes problems. If you quit using nicotine:  You will lower your risk for heart attack, stroke, nerve disease, and kidney disease.  Your cholesterol and blood pressure may improve.  Your blood circulation will improve. Keep your blood pressure under control To control your blood pressure:  Follow instructions from your health care provider about meal planning, exercise, and medicines.  Make sure your health care provider checks your blood pressure at every medical visit. A blood pressure reading consists of two numbers. Generally, the goal is to keep your top number (systolic pressure) at or below 140, and your  bottom number (diastolic pressure) at or below 90. Your health care provider may recommend a lower target blood pressure. Your individualized target blood pressure is determined based on:  Your age.  Your medicines.  How long you have had diabetes.  Any other medical conditions you have. Keep your cholesterol under control To control your cholesterol:  Follow instructions from your health  care provider about meal planning, exercise, and medicines.  Have your cholesterol checked at least once a year.  You may be prescribed medicine to lower cholesterol (statin). If you are not taking a statin, ask your health care provider if you should be. Controlling your cholesterol may:  Help prevent heart disease and stroke. These are the most common health problems for people with diabetes.  Improve your blood flow. Schedule and keep yearly physical exams and eye exams Your health care provider will tell you how often you need medical visits depending on your diabetes management plan. Keep all follow-up visits as directed. This is important so possible problems can be identified early and complications can be avoided or treated.  Every visit with your health care provider should include measuring your:  Weight.  Blood pressure.  Blood glucose control.  Your A1c (hemoglobin A1c) level should be checked:  At least 2 times a year, if you are meeting your treatment goals.  4 times a year, if you are not meeting treatment goals or if your treatment goals have changed.  Your blood lipids (lipid profile) should be checked yearly. You should also be checked yearly for protein in your urine (urine microalbumin).  If you have type 1 diabetes, get an eye exam 3-5 years after you are diagnosed, and then once a year after your first exam.  If you have type 2 diabetes, get an eye exam as soon as you are diagnosed, and then once a year after your first exam. Keep your vaccines current It is  recommended that you receive:  A flu (influenza) vaccine every year.  A pneumonia (pneumococcal) vaccine and a hepatitis B vaccine. If you are age 75 or older, you may get the pneumonia vaccine as a series of two separate shots. Ask your health care provider which other vaccines may be recommended. Take care of your feet Diabetes may cause you to have poor blood circulation to your legs and feet. Because of this, taking care of your feet is very important. Diabetes can cause:  The skin on the feet to get thinner, break more easily, and heal more slowly.  Nerve damage in your legs and feet, which results in decreased feeling. You may not notice minor injuries that could lead to serious problems. To avoid foot problems:  Check your skin and feet every day for cuts, bruises, redness, blisters, or sores.  Schedule a foot exam with your health care provider once every year. This exam includes:  Inspecting of the structure and skin of your feet.  Checking the pulses and sensation in your feet.  Make sure that your health care provider performs a visual foot exam at every medical visit. Take care of your teeth People with poorly controlled diabetes are more likely to have gum (periodontal) disease. Diabetes can make periodontal diseases harder to control. If not treated, periodontal diseases can lead to tooth loss. To prevent this:  Brush your teeth twice a day.  Floss at least once a day.  Visit your dentist 2 times a year. Drink responsibly Limit alcohol intake to no more than 1 drink a day for nonpregnant women and 2 drinks a day for men. One drink equals 12 oz of beer, 5 oz of wine, or 1 oz of hard liquor. It is important to eat food when you drink alcohol to avoid low blood glucose (hypoglycemia). Avoid alcohol if you:  Have a history of alcohol abuse or dependence.  Are pregnant.  Have liver  disease, pancreatitis, advanced neuropathy, or severe hypertriglyceridemia. Lessen  stress Living with diabetes can be stressful. When you are experiencing stress, your blood glucose may be affected in two ways:  Stress hormones may cause your blood glucose to rise.  You may be distracted from taking good care of yourself. Be aware of your stress level and make changes to help you manage challenging situations. To lower your stress levels:  Consider joining a support group.  Do planned relaxation or meditation.  Do a hobby that you enjoy.  Maintain healthy relationships.  Exercise regularly.  Work with your health care provider or a mental health professional. Summary  You can take action to prevent or slow down problems that are caused by diabetes (diabetes mellitus). Following your diabetes plan and taking care of yourself can reduce your risk of serious or life-threatening complications.  Follow instructions from your health care providers about managing your diabetes. Your diabetes may be managed by a team of health care providers who can teach you how to care for yourself and can answer questions that you have.  Your health care provider will tell you how often you need medical visits depending on your diabetes management plan. Keep all follow-up visits as directed. This is important so possible problems can be identified early and complications can be avoided or treated. This information is not intended to replace advice given to you by your health care provider. Make sure you discuss any questions you have with your health care provider. Document Released: 02/22/2011 Document Revised: 03/05/2016 Document Reviewed: 03/05/2016 Elsevier Interactive Patient Education  2017 [employer].     Patients with Insulin Pumps     For patients with Insulin Pumps: o Contact your diabetes doctor for specific instructions before surgery. o Decrease basal insulin rates by 20% at midnight the night before surgery. o Note that if your surgery is planned to be longer  than 2 hours, your insulin pump will be removed and intravenous (IV) insulin will be started and managed by the nurses and anesthesiologist. You will be able to restart your insulin pump once you are awake and able to manage it. o Make sure to bring insulin pump supplies to the hospital with you in case your site needs to be changed.   Hypoglycemia  Hypoglycemia is when the sugar (glucose) level in the blood is too low. Symptoms of low blood sugar may include:  Feeling:  Hungry.  Worried or nervous (anxious).  Sweaty and clammy.  Confused.  Dizzy.  Sleepy.  Sick to your stomach (nauseous).  Having:  A fast heartbeat.  A headache.  A change in your vision.  Jerky movements that you cannot control (seizure).  Nightmares.  Tingling or no feeling (numbness) around the mouth, lips, or tongue.  Having trouble with:  Talking.  Paying attention (concentrating).  Moving (coordination).  Sleeping.  Shaking.  Passing out (fainting).  Getting upset easily (irritability). Low blood sugar can happen to people who have diabetes and people who do not have diabetes. Low blood sugar can happen quickly, and it can be an emergency. Treating Low Blood Sugar  Low blood sugar is often treated by eating or drinking something sugary right away. If you can think clearly and swallow safely, follow the 15:15 rule:  Take 15 grams of a fast-acting carb (carbohydrate). Some fast-acting carbs are:  1 tube of glucose gel.  3 sugar tablets (glucose pills).  6-8 pieces of hard candy.  4 oz (120 mL) of fruit juice.  4 oz (120 mL) of regular (not diet) soda.  Check your blood sugar 15 minutes after you take the carb.  If your blood sugar is still at or below 70 mg/dL (3.9 mmol/L), take 15 grams of a carb again.  If your blood sugar does not go above 70 mg/dL (3.9 mmol/L) after 3 tries, get help right away.  After your blood sugar goes back to normal, eat a meal or a snack within  1 hour. Treating Very Low Blood Sugar  If your blood sugar is at or below 54 mg/dL (3 mmol/L), you have very low blood sugar (severe hypoglycemia). This is an emergency. Do not wait to see if the symptoms will go away. Get medical help right away. Call your local emergency services (911 in the [place] Do not drive yourself to the hospital. If you have very low blood sugar and you cannot eat or drink, you may need a glucagon shot (injection). A family member or friend should learn how to check your blood sugar and how to give you a glucagon shot. Ask your doctor if you need to have a glucagon shot kit at home. Follow these instructions at home: General instructions  Avoid any diets that cause you to not eat enough food. Talk with your doctor before you start any new diet.  Take over-the-counter and prescription medicines only as told by your doctor.  Limit alcohol to no more than 1 drink per day for nonpregnant women and 2 drinks per day for men. One drink equals 12 oz of beer, 5 oz of wine, or 1 oz of hard liquor.  Keep all follow-up visits as told by your doctor. This is important. If You Have Diabetes:   Make sure you know the symptoms of low blood sugar.  Always keep a source of sugar with you, such as:  Sugar.  Sugar tablets.  Glucose gel.  Fruit juice.  Regular soda (not diet soda).  Milk.  Hard candy.  Honey.  Take your medicines as told.  Follow your exercise and meal plan.  Eat on time. Do not skip meals.  Follow your sick day plan when you cannot eat or drink normally. Make this plan ahead of time with your doctor.  Check your blood sugar as often as told by your doctor. Always check before and after exercise.  Share your diabetes care plan with:  Your work or school.  People you live with.  Check your pee (urine) for ketones:  When you are sick.  As told by your doctor.  Carry a card or wear jewelry that says you have diabetes. If You Have Low  Blood Sugar From Other Causes:   Check your blood sugar as often as told by your doctor.  Follow instructions from your doctor about what you cannot eat or drink. Contact a doctor if:  You have trouble keeping your blood sugar in your target range.  You have low blood sugar often. Get help right away if:  You still have symptoms after you eat or drink something sugary.  Your blood sugar is at or below 54 mg/dL (3 mmol/L).  You have jerky movements that you cannot control.  You pass out. These symptoms may be an emergency. Do not wait to see if the symptoms will go away. Get medical help right away. Call your local emergency services (911 in the [place] Do not drive yourself to the hospital.  This information is not intended to replace advice given to you  by your health care provider. Make sure you discuss any questions you have with your health care provider. Document Released: 08/31/2009 Document Revised: 11/12/2015 Document Reviewed: 07/10/2015 Elsevier Interactive Patient Education  2017 [place].  Blood Glucose Monitoring, Adult Monitoring your blood sugar (glucose) helps you manage your diabetes. It also helps you and your health care provider determine how well your diabetes management plan is working. Blood glucose monitoring involves checking your blood glucose as often as directed, and keeping a record (log) of your results over time. Why should I monitor my blood glucose? Checking your blood glucose regularly can:  Help you understand how food, exercise, illnesses, and medicines affect your blood glucose.  Let you know what your blood glucose is at any time. You can quickly tell if you are having low blood glucose (hypoglycemia) or high blood glucose (hyperglycemia).  Help you and your health care provider adjust your medicines as needed. When should I check my blood glucose? Follow instructions from your health care provider about how often to check your blood  glucose. This may depend on:  The type of diabetes you have.  How well-controlled your diabetes is.  Medicines you are taking. If you have type 1 diabetes:  Check your blood glucose at least 2 times a day.  Also check your blood glucose:  Before every insulin injection.  Before and after exercise.  Between meals.  2 hours after a meal.  Occasionally between 2:00 a.m. and 3:00 a.m., as directed.  Before potentially dangerous tasks, like driving or using heavy machinery.  At bedtime.  You may need to check your blood glucose more often, up to 6-10 times a day:  If you use an insulin pump.  If you need multiple daily injections (MDI).  If your diabetes is not well-controlled.  If you are ill.  If you have a history of severe hypoglycemia.  If you have a history of not knowing when your blood glucose is getting low (hypoglycemia unawareness). If you have type 2 diabetes:  If you take insulin or other diabetes medicines, check your blood glucose at least 2 times a day.  If you are on intensive insulin therapy, check your blood glucose at least 4 times a day. Occasionally, you may also need to check between 2:00 a.m. and 3:00 a.m., as directed.  Also check your blood glucose:  Before and after exercise.  Before potentially dangerous tasks, like driving or using heavy machinery.  You may need to check your blood glucose more often if:  Your medicine is being adjusted.  Your diabetes is not well-controlled.  You are ill. What is a blood glucose log?  A blood glucose log is a record of your blood glucose readings. It helps you and your health care provider:  Look for patterns in your blood glucose over time.  Adjust your diabetes management plan as needed.  Every time you check your blood glucose, write down your result and notes about things that may be affecting your blood glucose, such as your diet and exercise for the day.  Most glucose meters store a  record of glucose readings in the meter. Some meters allow you to download your records to a computer. How do I check my blood glucose? Follow these steps to get accurate readings of your blood glucose: Supplies needed   Blood glucose meter.  Test strips for your meter. Each meter has its own strips. You must use the strips that come with your meter.  A  needle to prick your finger (lancet). Do not use lancets more than once.  A device that holds the lancet (lancing device).  A journal or log book to write down your results. Procedure  Wash your hands with soap and water.  Prick the side of your finger (not the tip) with the lancet. Use a different finger each time.  Gently rub the finger until a small drop of blood appears.  Follow instructions that come with your meter for inserting the test strip, applying blood to the strip, and using your blood glucose meter.  Write down your result and any notes. Alternative testing sites  Some meters allow you to use areas of your body other than your finger (alternative sites) to test your blood.  If you think you may have hypoglycemia, or if you have hypoglycemia unawareness, do not use alternative sites. Use your finger instead.  Alternative sites may not be as accurate as the fingers, because blood flow is slower in these areas. This means that the result you get may be delayed, and it may be different from the result that you would get from your finger.  The most common alternative sites are:  Forearm.  Thigh.  Palm of the hand. Additional tips  Always keep your supplies with you.  If you have questions or need help, all blood glucose meters have a 24-hour hotline number that you can call. You may also contact your health care provider.  After you use a few boxes of test strips, adjust (calibrate) your blood glucose meter by following instructions that came with your meter. This information is not intended to replace advice  given to you by your health care provider. Make sure you discuss any questions you have with your health care provider. Document Released: 06/09/2003 Document Revised: 12/25/2015 Document Reviewed: 11/16/2015 Elsevier Interactive Patient Education  2017 [employer].

## 2016-06-06 NOTE — Progress Notes (Signed)
Per diabetes coordinator, patient's basal dose of insulin needs to be discontinued because she received lantus this morning. Patient unsure how to do this. Diabetes coordinator notified and advised writer to have patient turn off her insulin pump and she will come show the patient how to program the next bolus. Patient suspended her pump. Will continue to monitor. Wyonia Hough

## 2016-06-06 NOTE — Discharge Summary (Addendum)
Physician Discharge Summary  ROCHELE PHILSON [id number] DOB: [birth year] DOA: 06/02/2016  PCP: Shellia Carwin, PA-C Endocrinologist: Dr. Nicholes Stairs  Admit date: 06/02/2016 Discharge date: 06/06/2016  Admitted From: Home  Disposition: Home   Recommendations for Outpatient Follow-up:  1. Follow up with PCP in 1 weeks 2. Follow up with endocrinologist in 2 days as scheduled 3. Please obtain BMP/CBC in one week  Discharge Condition: STABLE  CODE STATUS: FULL Diet recommendation: Heart Healthy / Carb Modified  Brief/Interim Summary: Brief Narrative:  55 year old female with medical history of diabetes, hypertension and hypothyroidism was admitted for HONK. In the ED had persistent hypotension despite 4 L of IV fluid and subsequently she was intubated due to obtundation and not able to protect airway. Patient was treated with insulin drip, Levophed and IVF. Extubated on 12/15. Treated for Community Acquired Pneumonia as well.  Restarted on insulin pump 12/18 prior to discharge.   Subjective: Patient seen and examined with husband at bedside. Patient continues to be on nasal cannula due to shortness of breath. Denies chest pain and cough. Tolerating diet but complaining of feeling slightly weak when walking around.  Assessment & Plan: HONK DM - A1c 5.5 Of insulin drip - seen by diabetes coordinator, called company to have pump checked for safety.  Restarted insulin pump 12/18 prior to discharge and close outpatient follow up arranged with endocrinologist in 2 days.   On chart review seems that initial chest x-ray shows some signs of pneumonia likely the trigger off the hyperglycemia. Patient was treated with vancomycin and Zosyn and was discontinued on 12/16. Will start azithromycin given x-ray findings and oxygen requirements. Complete 3 more doses of azithromycin.  Also on my exam she have diminished breath sounds at the left lower base clinically correlating with pneumonia.  Inpatient  Diabetes Program Recommendations  AACE/ADA: New Consensus Statement on Inpatient Glycemic Control (2015)  Target Ranges:  Prepandial:              less than 140 mg/dL                            Peak postprandial:    less than 180 mg/dL (1-2 hours)                            Critically ill patients:  140 - 180 mg/dL   Recent Labs       Lab Results  Component Value Date   GLUCAP 238 (H) 06/06/2016   HGBA1C 5.5 06/02/2016     Results for CAPRISHA, HOLLSTEIN (MRN [id number]) as of 06/06/2016 11:28  Ref. Range 06/05/2016 07:50 06/05/2016 13:07 06/05/2016 17:21 06/05/2016 21:35 06/06/2016 08:00  Glucose-Capillary Latest Ref Range: 65 - 99 mg/dL 334 (H) 186 (H) 235 (H) 232 (H) 238 (H)   Review of Glycemic Control  Diabetes history: DM1, patient came in due to insulin pump running out of insulin Outpatient Diabetes medications: Insulin Pump Current orders for Inpatient glycemic control: Spoke with patient regarding diabetes and home regimen for diabetes management.  Patient states that she was diagnosed with diabetes at the age of 55 years old.  Patient uses a Medtronic insulin pump with Humalog insulin as an outpatient.Changed to patient's pump at 1100 this am with following settings:             Regular insulin rate 0000 = 0.75 units per hour  0200 = 0.7 units per hour                                               0600 = 1.0 units per hour                                              1130 = 0.75 units per hour                                              1700 = 0.725 units per hour                                              2100 = 0.600 units per hour              Bolus meal coverage at 0000 = 12 units, 1130 = 18 units, and 1700 = 12 units regular insulin In talking with the patient she states that her blood glucose normally runs very good and her last A1C was 5.5%.  Patient is supposed to go home today, so insulin pump started. Explained  Lantus duration and advised patient that if he is discharged and resumes his insulin pump, he will not want to resume his basal insulin until 24 hours after the Lantus is given today since it will still be active (given this am at 0800). Patient verbalized understanding of information discussed and states that she does not have any further questions related to diabetes at this time.  Patient spoke with Endocrinologist and has appointment scheduled for this Wednesday.  Inpatient Diabetes Program Recommendations:             Patient's RN updated and given contract and flow sheet.             Spoke with patient at bedside regarding the following:          Discussed the following with patient: A. Changes in diabetes medications: 1. Taking: 2. Common side effects: 3. When and how to take medications: B.  Blood glucose monitoring: 1. Target range = 80 to 130 2. Goal of A1C 7% or less = average blood glucose level of 154 3. Take and record CBG's 4. When to take CBG's: 5. Why to take CBG's C.  Hyperglycemia = Thirsty, Tired and Tinkling D.  Hypoglycemia = Shaky, Sweaty and Silly E.  Carbohydrate modified diet: F.  Exercise: G.  Check your feet every day. H.  Follow-up 1. PCP or Endocrinologist this Wednesday  As  scheduled.  CAP Azithromycin 500 mg for 3 days Status post intubation 12/14, extubated 12/15  Hypotension - resolved ECHO pending given increase in TNI which was likely 2/2 to demand ischemia   Hypertension -stable Continue current treatment  AKI Prerenal from hypovolemia - improved with IV fluids Monitor BMP  Hepatic Steatosis  Noted on CT abdom - LFTs normal initially then mildly elevated - recheck in AM - pt counseled on need for weight loss   Acute metabolic  encephalopathy due to HONK  Hypothyroidism Cont home synthroid dose - TSH 1.871  Obesity - Body mass index is 35.3 kg/m.  DVT prophylaxis: subcutaneous heparin Code Status: FULL Family  Communication: Husband at bedside Disposition Plan: Home in the next 24-48 hrs  Consultants:   PCCM   Procedures:   ECHO pending  Antimicrobials:  Zosyn 12/14-12/16  Vanco 12/14-12/16  Azithro 12/17 ->   Discharge Diagnoses:  Active Problems:   DKA (diabetic ketoacidoses) (HCC)   AKI (acute kidney injury) ([place])   Lactic acid acidosis   Acute respiratory failure with hypoxia [facility])  Discharge Instructions  Discharge Instructions    Increase activity slowly    Complete by:  As directed      Allergies as of 06/06/2016      Reactions   Sulfa Antibiotics Hives, Itching      Medication List    STOP taking these medications   ibuprofen 400 MG tablet Commonly known as:  ADVIL,MOTRIN     TAKE these medications   amLODipine 10 MG tablet Commonly known as:  NORVASC Take 10 mg by mouth daily.   aspirin 81 MG chewable tablet Chew 1 tablet (81 mg total) by mouth daily. Start taking on:  06/07/2016   azithromycin 500 MG tablet Commonly known as:  ZITHROMAX Take 1 tablet (500 mg total) by mouth daily. Start taking on:  06/07/2016   glucose blood test strip 1 each by Other route as needed for other. Use as instructed   insulin pump Soln Inject into the skin. Husband has no idea what kind of insulin she takes. Husband states she is on a insulin pump. Pharmacy has a record of Novolog 60 units twice daily.   levothyroxine 137 MCG tablet Commonly known as:  SYNTHROID, LEVOTHROID Take 137 mcg by mouth daily before breakfast.   quinapril 20 MG tablet Commonly known as:  ACCUPRIL Take 20 mg by mouth at bedtime.   spironolactone 25 MG tablet Commonly known as:  ALDACTONE Take 25 mg by mouth daily.      Follow-up Information    Shellia Carwin, PA-C. Schedule an appointment as soon as possible for a visit in 1 week(s).   Specialty:  Physician Assistant Contact information: [place] [address] [place],  MD Follow up on 06/08/2016.   Specialty:  Endocrinology Why:  Go to scheduled appointment Contact information: [id number] [place] [address] [phone]          Allergies  Allergen Reactions  . Sulfa Antibiotics Hives and Itching   Procedures/Studies: Ct Abdomen Pelvis Wo Contrast  Result Date: 06/02/2016 CLINICAL DATA:  Nausea, vomiting and diarrhea beginning 06/01/2016 elevated white blood cell count. Altered level of consciousness 06/02/2016. EXAM: CT ABDOMEN AND PELVIS WITHOUT CONTRAST TECHNIQUE: Multidetector CT imaging of the abdomen and pelvis was performed following the standard protocol without IV contrast. COMPARISON:  None. FINDINGS: Lower chest: Heart size is mildly enlarged. No pleural or pericardial effusion. Dependent atelectasis is noted. Hepatobiliary: There is diffuse and marked fatty infiltration of the liver without focal lesion. The gallbladder and biliary tree are unremarkable. Pancreas: Unremarkable. No pancreatic ductal dilatation or surrounding inflammatory changes. Spleen: Normal in size without focal abnormality. Adrenals/Urinary Tract: Adrenal glands are unremarkable. Kidneys are normal, without renal calculi, focal lesion, or hydronephrosis. Bladder is unremarkable. Stomach/Bowel: Stomach is within normal limits. Appendix appears normal. No evidence of bowel wall thickening, distention, or inflammatory changes.  Vascular/Lymphatic: Mild calcific atherosclerotic vascular disease is seen. No adenopathy. Reproductive: Uterus and bilateral adnexa are unremarkable. Other: No abdominal wall hernia or abnormality. No abdominopelvic ascites. Musculoskeletal: No acute abnormality. Scoliosis is noted. The patient is status post spinal fusion. Hardware is incompletely imaged. Degenerative disc disease L4-5 and L5-S1 is noted. Right much worse than left hip osteoarthritis is seen. IMPRESSION: No acute abnormality or finding to explain the patient's symptoms.  Diffuse fatty infiltration of the liver. Electronically Signed   By: Inge Rise M.D.   On: 06/02/2016 19:57   Dg Chest Port 1 View  Result Date: 06/04/2016 CLINICAL DATA:  ET tube EXAM: PORTABLE CHEST 1 VIEW COMPARISON:  06/03/2016 FINDINGS: Interval removal of endotracheal tube and NG tube. Left central line remains in place, unchanged. There is cardiomegaly with vascular congestion and bilateral airspace opacities, slightly worsened since prior study, likely worsening edema. No definite effusions. IMPRESSION: Mild worsening in bilateral airspace disease, likely worsening edema. Electronically Signed   By: Rolm Baptise M.D.   On: 06/04/2016 07:29   Dg Chest Port 1 View  Result Date: 06/03/2016 CLINICAL DATA:  Respiratory failure, shortness of breath EXAM: PORTABLE CHEST 1 VIEW COMPARISON:  Portable chest x-ray of June 02, 2016 FINDINGS: The lungs are adequately inflated. There remains increased density at the left lung base. There is partial obscuration of the left hemidiaphragm. The interstitial markings are coarse though stable. The cardiac silhouette is top-normal in size. The pulmonary vascularity is normal. The endotracheal tube tip lies approximately 5.7 cm of above the carina. The tip is at the level of the clavicular heads. The esophagogastric tube tip and proximal port project below the inferior margin of the image. The left internal jugular venous catheter tip projects over the midportion of the SVC. IMPRESSION: Stable appearance of the chest since yesterday's study. Left lower lobe atelectasis or pneumonia and probable small left pleural effusion. Somewhat high positioning of the endotracheal tube. Correlation with the type of tube present is needed to assess the adequacy of this positioning. Electronically Signed   By: David  Martinique M.D.   On: 06/03/2016 07:20   Dg Chest Portable 1 View  Result Date: 06/02/2016 CLINICAL DATA:  Status post intubation today EXAM: PORTABLE CHEST 1  VIEW COMPARISON:  Single-view of the chest 12/scratch the single view of the chest earlier today FINDINGS: Endotracheal tube is in place with the tip just above the clavicular heads, well above the carina. New left IJ catheter tip projects near the superior cavoatrial junction. No pneumothorax. Bibasilar airspace opacities are unchanged. Marked scoliosis noted. IMPRESSION: ETT and IJ catheter projecting good position. No pneumothorax or other change. Electronically Signed   By: Inge Rise M.D.   On: 06/02/2016 18:06   Dg Chest Portable 1 View  Result Date: 06/02/2016 CLINICAL DATA:  Altered mental status. EXAM: PORTABLE CHEST 1 VIEW COMPARISON:  None. FINDINGS: Scoliosis rods are identified overlying the thoracic and upper lumbar spine. The heart size appears normal. There is decreased aeration to the left lung base which may reflect atelectasis, infiltrate or asymmetric edema. IMPRESSION: 1. Diminished aeration to left base as above. Cannot rule out pneumonia. Electronically Signed   By: Kerby Moors M.D.   On: 06/02/2016 14:45   Dg Abd Portable 1v  Result Date: 06/02/2016 CLINICAL DATA:  OG tube placement EXAM: PORTABLE ABDOMEN - 1 VIEW COMPARISON:  CT 06/02/2016 FINDINGS: Partially visualized spinal hardware. Esophageal tube tip projects over the stomach. Atelectasis or infiltrate left base. Upper bowel-gas pattern  unremarkable. IMPRESSION: Esophageal tube tip overlies the stomach Electronically Signed   By: Donavan Foil M.D.   On: 06/02/2016 22:38     Subjective: Pt without complaints.  Feeling better.  No chest pain.  No palpitations.  Pump restarted and doing well.  Has close follow up in 2 days with endocrinologist.  Discharge Exam: Vitals:   06/05/16 2106 06/06/16 0510  BP: (!) 144/77 134/62  Pulse: 71 73  Resp: 18 18  Temp: 99.3 F (37.4 C) 98.8 F (37.1 C)   Vitals:   06/04/16 2140 06/05/16 0441 06/05/16 2106 06/06/16 0510  BP: 135/65 (!) 143/74 (!) 144/77 134/62   Pulse: 63 99 71 73  Resp: 18 18 18 18   Temp: 98.2 F (36.8 C) 98.8 F (37.1 C) 99.3 F (37.4 C) 98.8 F (37.1 C)  TempSrc: Oral Oral Oral Oral  SpO2: 100% 92% 91% 94%  Weight:      Height:       General: Pt is alert, awake, not in acute distress Cardiovascular: RRR, S1/S2 +, no rubs, no gallops Respiratory: CTA bilaterally, no wheezing, no rhonchi Abdominal: Soft, NT, ND, bowel sounds + Extremities: no edema, no cyanosis  The results of significant diagnostics from this hospitalization (including imaging, microbiology, ancillary and laboratory) are listed below for reference.     Microbiology: Recent Results (from the past 240 hour(s))  Culture, blood (Routine X 2) w Reflex to ID Panel     Status: None (Preliminary result)   Collection Time: 06/02/16  6:30 PM  Result Value Ref Range Status   Specimen Description BLOOD LEFT ANTECUBITAL  Final   Special Requests   Final    BOTTLES DRAWN AEROBIC AND ANAEROBIC BLOOD AEROBIC BOTTLE 10CC BLOOD ANAEROBIC BOTTLE 5CC   Culture NO GROWTH 3 DAYS  Final   Report Status PENDING  Incomplete  Culture, blood (Routine X 2) w Reflex to ID Panel     Status: None (Preliminary result)   Collection Time: 06/02/16  6:40 PM  Result Value Ref Range Status   Specimen Description BLOOD LEFT HAND  Final   Special Requests BOTTLES DRAWN AEROBIC ONLY 10CC  Final   Culture NO GROWTH 3 DAYS  Final   Report Status PENDING  Incomplete  MRSA PCR Screening     Status: None   Collection Time: 06/02/16  8:08 PM  Result Value Ref Range Status   MRSA by PCR NEGATIVE NEGATIVE Final    Comment:        The GeneXpert MRSA Assay (FDA approved for NASAL specimens only), is one component of a comprehensive MRSA colonization surveillance program. It is not intended to diagnose MRSA infection nor to guide or monitor treatment for MRSA infections.      Labs: BNP (last 3 results) No results for input(s): BNP in the last 8760 hours. Basic Metabolic  Panel:  Recent Labs Lab 06/03/16 0358 06/03/16 1620 06/04/16 0000 06/05/16 0618 06/06/16 0416  NA 142 142 138 139 141  K 3.3* 3.8 4.2 4.5 3.7  CL 110 112* 108 104 104  CO2 24 24 24 24 26   GLUCOSE 143* 143* 315* 298* 240*  BUN 33* 26* 20 13 9   CREATININE 1.37* 1.17* 1.15* 0.97 0.86  CALCIUM 9.0 8.6* 8.5* 8.6* 8.4*  MG 2.1  --  1.9  --   --   PHOS 2.4*  --  2.3*  --   --    Liver Function Tests:  Recent Labs Lab 06/02/16 1430 06/04/16 0000 06/05/16 [id number]  AST 38 136* 142*  ALT 34 145* 207*  ALKPHOS 91 74 90  BILITOT 1.0 1.1 2.4*  PROT 5.8* 5.6* 5.8*  ALBUMIN 3.5 3.1* 3.2*   No results for input(s): LIPASE, AMYLASE in the last 168 hours. No results for input(s): AMMONIA in the last 168 hours. CBC:  Recent Labs Lab 06/02/16 1430 06/03/16 0358 06/04/16 0448 06/05/16 0618 06/06/16 0416  WBC 21.6* 18.3* 14.1* 10.0 8.3  NEUTROABS 18.1*  --  11.7*  --   --   HGB 11.9* 11.8* 11.7* 12.3 11.4*  HCT 35.9* 34.7* 35.5* 36.1 33.0*  MCV 98.1 93.0 96.2 95.5 93.5  PLT 246 235 178 176 163   Cardiac Enzymes:  Recent Labs Lab 06/02/16 1824 06/04/16 0000 06/04/16 1030  TROPONINI 0.10* 0.16* 0.10*   BNP: Invalid input(s): POCBNP CBG:  Recent Labs Lab 06/05/16 1307 06/05/16 1721 06/05/16 2135 06/06/16 0800 06/06/16 1137  GLUCAP 186* 235* 232* 238* 223*   D-Dimer No results for input(s): DDIMER in the last 72 hours. Hgb A1c No results for input(s): HGBA1C in the last 72 hours. Lipid Profile No results for input(s): CHOL, HDL, LDLCALC, TRIG, CHOLHDL, LDLDIRECT in the last 72 hours. Thyroid function studies No results for input(s): TSH, T4TOTAL, T3FREE, THYROIDAB in the last 72 hours.  Invalid input(s): FREET3 Anemia work up  Recent Labs  06/05/16 0618  VITAMINB12 3,108*  FOLATE 29.6  FERRITIN 295  TIBC 202*  IRON 40  RETICCTPCT 0.9   Urinalysis    Component Value Date/Time   COLORURINE YELLOW 06/02/2016 2039   APPEARANCEUR CLEAR 06/02/2016 2039    LABSPEC 1.016 06/02/2016 2039   PHURINE 5.0 06/02/2016 2039   GLUCOSEU >=500 (A) 06/02/2016 2039   HGBUR SMALL (A) 06/02/2016 2039   [place] NEGATIVE 06/02/2016 2039   KETONESUR 20 (A) 06/02/2016 2039   PROTEINUR NEGATIVE 06/02/2016 2039   NITRITE NEGATIVE 06/02/2016 2039   LEUKOCYTESUR NEGATIVE 06/02/2016 2039   Sepsis Labs Invalid input(s): PROCALCITONIN,  WBC,  LACTICIDVEN Microbiology Recent Results (from the past 240 hour(s))  Culture, blood (Routine X 2) w Reflex to ID Panel     Status: None (Preliminary result)   Collection Time: 06/02/16  6:30 PM  Result Value Ref Range Status   Specimen Description BLOOD LEFT ANTECUBITAL  Final   Special Requests   Final    BOTTLES DRAWN AEROBIC AND ANAEROBIC BLOOD AEROBIC BOTTLE 10CC BLOOD ANAEROBIC BOTTLE 5CC   Culture NO GROWTH 3 DAYS  Final   Report Status PENDING  Incomplete  Culture, blood (Routine X 2) w Reflex to ID Panel     Status: None (Preliminary result)   Collection Time: 06/02/16  6:40 PM  Result Value Ref Range Status   Specimen Description BLOOD LEFT HAND  Final   Special Requests BOTTLES DRAWN AEROBIC ONLY 10CC  Final   Culture NO GROWTH 3 DAYS  Final   Report Status PENDING  Incomplete  MRSA PCR Screening     Status: None   Collection Time: 06/02/16  8:08 PM  Result Value Ref Range Status   MRSA by PCR NEGATIVE NEGATIVE Final    Comment:        The GeneXpert MRSA Assay (FDA approved for NASAL specimens only), is one component of a comprehensive MRSA colonization surveillance program. It is not intended to diagnose MRSA infection nor to guide or monitor treatment for MRSA infections.    Time coordinating discharge: 32 minutes  SIGNED:  Irwin Brakeman, MD  [facility] 06/06/2016, 1:44 PM Pager  If 7PM-7AM, please contact night-coverage [email or web address] Password [email or web address]

## 2016-06-06 NOTE — Progress Notes (Addendum)
Inpatient Diabetes Program Recommendations  AACE/ADA: New Consensus Statement on Inpatient Glycemic Control (2015)  Target Ranges:  Prepandial:   less than 140 mg/dL      Peak postprandial:   less than 180 mg/dL (1-2 hours)      Critically ill patients:  140 - 180 mg/dL   Lab Results  Component Value Date   GLUCAP 238 (H) 06/06/2016   HGBA1C 5.5 06/02/2016   Results for Julia Holt, Julia Holt (MRN [id number]) as of 06/06/2016 11:28  Ref. Range 06/05/2016 07:50 06/05/2016 13:07 06/05/2016 17:21 06/05/2016 21:35 06/06/2016 08:00  Glucose-Capillary Latest Ref Range: 65 - 99 mg/dL 334 (H) 186 (H) 235 (H) 232 (H) 238 (H)   Review of Glycemic Control  Diabetes history: DM1, patient came in due to insulin pump running out of insulin Outpatient Diabetes medications: Insulin Pump Current orders for Inpatient glycemic control: Spoke with patient regarding diabetes and home regimen for diabetes management.  Patient states that she was diagnosed with diabetes at the age of 55 years old.  Patient uses a Medtronic insulin pump with Humalog insulin as an outpatient.Changed to patient's pump at 1100 this am with following settings:  Regular insulin rate 0000 = 0.75 units per hour                                   0200 = 0.7 units per hour                                    0600 = 1.0 units per hour                                   1130 = 0.75 units per hour             1700 = 0.725 units per hour             2100 = 0.600 units per hour   Bolus meal coverage at 0000 = 1 unit per 12 G carb's, 1130 = 1 unit per 18 G carb's, and 1700 = 1 unit per 12 G carb's of regular insulin.  Patient correction based on 1 unit of insulin to 50 mg/dL decrease in CBG.  In talking with the patient she states that her blood glucose normally runs very good and her last A1C was 5.5%.  Patient is supposed to go home today, so insulin pump started. Explained Lantus duration and advised patient that if she is discharged, she will not  want to resume her basal insulin until 24 hours after the Lantus was given today since it will still be active (given this am at 0800). Patient verbalized understanding of information discussed and states that she does not have any further questions related to diabetes at this time.  Patient spoke with Endocrinologist and has appointment scheduled for this Wednesday.  Inpatient Diabetes Program Recommendations:  Patient's RN updated and given contract and flow sheet.  Spoke with patient at bedside regarding the following:     Discussed the following with patient: A. Changes in diabetes medications: 1. Taking: 2. Common side effects: 3. When and how to take medications: B.  Blood glucose monitoring: 1. Target range = 80 to 130 2. Goal of A1C 7% or less = average blood glucose level of  154 3. Take and record CBG's 4. When to take CBG's: 5. Why to take CBG's C.  Hyperglycemia = Thirsty, Tired and Tinkling D.  Hypoglycemia = Shaky, Sweaty and Silly E.  Carbohydrate modified diet: F.  Exercise: G.  Check your feet every day. H.  Follow-up 1. PCP or Endocrinologist this Wednesday is scheduled.  Thank you,  Windy Carina, RN, BSN Diabetes Coordinator Inpatient Diabetes Program [phone] (Team Pager)

## 2016-06-06 NOTE — Progress Notes (Signed)
  Progress Note   Date: 06/06/2016  Patient Name: Julia Holt        MRN#: [id number]   Clarification of the diagnosis of encephalopathy:  Acute encephalopathy - Metabolic/Septic    Irwin Brakeman, MD

## 2016-06-07 LAB — CULTURE, BLOOD (ROUTINE X 2)
Culture: NO GROWTH
Culture: NO GROWTH

## 2016-07-15 ENCOUNTER — Other Ambulatory Visit: Payer: Self-pay | Admitting: Physician Assistant

## 2016-07-15 DIAGNOSIS — R0600 Dyspnea, unspecified: Secondary | ICD-10-CM

## 2016-07-18 ENCOUNTER — Encounter (INDEPENDENT_AMBULATORY_CARE_PROVIDER_SITE_OTHER): Payer: BLUE CROSS/BLUE SHIELD | Admitting: Internal Medicine

## 2016-07-18 DIAGNOSIS — R0600 Dyspnea, unspecified: Secondary | ICD-10-CM | POA: Diagnosis not present

## 2016-07-18 LAB — PULMONARY FUNCTION TEST
DL/VA % pred: 100 %
DL/VA: 5.35 ml/min/mmHg/L
DLCO UNC % PRED: 88 %
DLCO UNC: 27.42 ml/min/mmHg
DLCO cor % pred: 83 %
DLCO cor: 25.82 ml/min/mmHg
FEF 25-75 PRE: 2.06 L/s
FEF 25-75 Post: 1.89 L/sec
FEF2575-%Change-Post: -7 %
FEF2575-%Pred-Post: 65 %
FEF2575-%Pred-Pre: 70 %
FEV1-%Change-Post: -1 %
FEV1-%PRED-POST: 77 %
FEV1-%PRED-PRE: 78 %
FEV1-PRE: 2.5 L
FEV1-Post: 2.47 L
FEV1FVC-%Change-Post: 0 %
FEV1FVC-%Pred-Pre: 96 %
FEV6-%CHANGE-POST: 0 %
FEV6-%PRED-POST: 81 %
FEV6-%PRED-PRE: 81 %
FEV6-POST: 3.22 L
FEV6-PRE: 3.23 L
FEV6FVC-%CHANGE-POST: 0 %
FEV6FVC-%PRED-POST: 102 %
FEV6FVC-%PRED-PRE: 102 %
FVC-%CHANGE-POST: -1 %
FVC-%Pred-Post: 78 %
FVC-%Pred-Pre: 79 %
FVC-Post: 3.22 L
FVC-Pre: 3.26 L
POST FEV6/FVC RATIO: 100 %
Post FEV1/FVC ratio: 77 %
Pre FEV1/FVC ratio: 77 %
Pre FEV6/FVC Ratio: 99 %
RV % PRED: 86 %
RV: 1.83 L
TLC % pred: 93 %
TLC: 5.44 L

## 2016-09-01 ENCOUNTER — Other Ambulatory Visit: Payer: BLUE CROSS/BLUE SHIELD

## 2016-09-01 ENCOUNTER — Encounter: Payer: Self-pay | Admitting: Pulmonary Disease

## 2016-09-01 ENCOUNTER — Ambulatory Visit (INDEPENDENT_AMBULATORY_CARE_PROVIDER_SITE_OTHER): Payer: BLUE CROSS/BLUE SHIELD | Admitting: Pulmonary Disease

## 2016-09-01 VITALS — BP 130/70 | HR 85 | Ht 69.0 in | Wt 195.0 lb

## 2016-09-01 DIAGNOSIS — J441 Chronic obstructive pulmonary disease with (acute) exacerbation: Secondary | ICD-10-CM

## 2016-09-01 NOTE — Patient Instructions (Signed)
Pulmonary function tests were reviewed with you today. We will check alpha-1 antitrypsin levels and phenotype  Return to clinic in 6 months.

## 2016-09-01 NOTE — Progress Notes (Signed)
Julia Holt    [id number]    November 30, 1960  Primary Care Physician:JOBE,DANIEL B., MD  Referring Physician: Shellia Carwin, PA-C [address] [place], [place] [address]  Chief complaint:  Consult for evaluation of dyspnea, abnormal PFTs, chest x-ray  HPI: Julia Holt is a 56 year old with past medical history of hypertension, diabetes. She had an admission in December 2017 for hyperosmolar coma, community acquired pneumonia. She was in shock during that admission and was intubated briefly. Postadmission she has mild dyspnea on exertion. She denies dyspnea at rest, cough, mucus production, wheezing, fevers, chills. She has history of scoliosis and had harrington rods placed as a child. She has no smoking history but hadn't been exposed to secondhand smoke. 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 She does not report any exposures at work or at home.  She did recent chest x-ray which is read as hyperinflation and pulmonary function tests which showed minimal obstruction and small airways disease. She is here for further evaluation.  Outpatient Encounter Prescriptions as of 09/01/2016  Medication Sig  . amLODipine (NORVASC) 10 MG tablet Take 10 mg by mouth daily.  Marland Kitchen aspirin 81 MG chewable tablet Chew 1 tablet (81 mg total) by mouth daily.  . fluvoxaMINE (LUVOX) 100 MG tablet Take 100 mg by mouth at bedtime.  Marland Kitchen glucose blood test strip 1 each by Other route as needed for other. Use as instructed  . Insulin Human (INSULIN PUMP) SOLN Inject into the skin. Husband has no idea what kind of insulin she takes. Husband states she is on a insulin pump. Pharmacy has a record of Novolog 60 units twice daily.  Marland Kitchen levothyroxine (SYNTHROID, LEVOTHROID) 137 MCG tablet Take 137 mcg by mouth daily before breakfast.  . quinapril (ACCUPRIL) 20 MG tablet Take 20 mg by mouth at bedtime.  Marland Kitchen spironolactone (ALDACTONE) 25 MG tablet Take 25 mg by mouth daily.   No  facility-administered encounter medications on file as of 09/01/2016.     Allergies as of 09/01/2016 - Review Complete 09/01/2016  Allergen Reaction Noted  . Sulfa antibiotics Hives and Itching 05/03/2013    Past Medical History:  Diagnosis Date  . Diabetes mellitus without complication ([place])   . Hypertension   . Thyroid disease     Past Surgical History:  Procedure Laterality Date  . BACK SURGERY    . BUNIONECTOMY Left     Family History  Problem Relation Age of Onset  . Hypertension Mother   . Hyperlipidemia Mother   . Dementia Mother   . Alzheimer's disease Father     Social History   Social History  . Marital status: Married    Spouse name: N/A  . Number of children: N/A  . Years of education: N/A   Occupational History  . Not on file.   Social History Main Topics  . Smoking status: Never Smoker  . Smokeless tobacco: Never Used  . Alcohol use No  . Drug use: No  . Sexual activity: Not on file   Other Topics Concern  . Not on file   Social History Narrative  . No narrative on file    Review of systems: Review of Systems  Constitutional: Negative for fever and chills.  HENT: Negative.   Eyes: Negative for blurred vision.  Respiratory: as per HPI  Cardiovascular: Negative for chest pain and palpitations.  Gastrointestinal: Negative for vomiting, diarrhea, blood per rectum. Genitourinary: Negative for dysuria, urgency,  frequency and hematuria.  Musculoskeletal: Negative for myalgias, back pain and joint pain.  Skin: Negative for itching and rash.  Neurological: Negative for dizziness, tremors, focal weakness, seizures and loss of consciousness.  Endo/Heme/Allergies: Negative for environmental allergies.  Psychiatric/Behavioral: Negative for depression, suicidal ideas and hallucinations.  All other systems reviewed and are negative.  Physical Exam: Blood pressure 130/70, pulse 85, height 5\' 9"  (1.753 m), weight 195 lb (88.5 kg), SpO2 97 %. Gen:       No acute distress HEENT:  EOMI, sclera anicteric Neck:     No masses; no thyromegaly Lungs:    Clear to auscultation bilaterally; normal respiratory effort CV:         Regular rate and rhythm; no murmurs Abd:      + bowel sounds; soft, non-tender; no palpable masses, no distension Ext:    No edema; adequate peripheral perfusion Skin:      Warm and dry; no rash Neuro: alert and oriented x 3 Psych: normal mood and affect  Data Reviewed: PFTs 07/18/16 FVC 3.22 (78%) FEV1 2.47 [77%) F/F 77 TLC 93%  DLCO 88% Minimal obstructive airwaydisease, small airway disease.  Chest x-ray 06/30/16-hyperinflation. No acute infiltrate. I have reviewed the images personally.  Assessment:  Evaluation for abnormal chest x-ray, PFTs I have reviewed the chest x-ray and lung function tests with her today. There is very minimal obstruction on spirometry. Although the chest x-ray shows some hyperinflation and I do not suspect COPD she is a lifelong nonsmoker. Her symptoms are stable and I don't believe she needs to be on any inhalers currently. I'll check an antitrypsin phenotype for further evaluation and to reassure the patient.  Plan/Recommendations: - Check A1 AT levels and phenotype  Marshell Garfinkel MD [facility] Pager [phone] 09/01/2016, 11:48 AM  CC: Shellia Carwin, PA-C

## 2016-09-07 LAB — ALPHA-1 ANTITRYPSIN PHENOTYPE: A1 ANTITRYPSIN: 191 mg/dL (ref 83–199)

## 2016-09-13 ENCOUNTER — Telehealth: Payer: Self-pay | Admitting: Pulmonary Disease

## 2016-09-13 NOTE — Telephone Encounter (Signed)
lmom tcb x1   Notes recorded by Marshell Garfinkel, MD on 09/13/2016 at 2:25 PM EDT Please let the patient know that the Alpha 1 antitrypsin tests were normal

## 2016-09-14 ENCOUNTER — Telehealth: Payer: Self-pay | Admitting: Pulmonary Disease

## 2016-09-14 NOTE — Telephone Encounter (Signed)
Spoke with pt's spouse, who was requesting lab results. Pt's spouse has been aware of normal lab results. Nothing further needed.

## 2016-09-14 NOTE — Telephone Encounter (Signed)
Called and spoke to pt. Informed her of the results and recs per PM. Pt verbalized understanding and denied any further questions or concerns at this time.  

## 2016-09-14 NOTE — Progress Notes (Signed)
Called and spoke to pt. Informed her of the results per PM. Pt verbalized understanding and denied any further questions or concerns at this time.

## 2016-09-14 NOTE — Telephone Encounter (Signed)
Patient returning call she can be reached at [phone] or at work [phone] -pr

## 2017-01-02 ENCOUNTER — Encounter: Payer: Self-pay | Admitting: *Deleted

## 2017-01-02 ENCOUNTER — Telehealth: Payer: Self-pay | Admitting: *Deleted

## 2017-01-02 NOTE — Telephone Encounter (Addendum)
Pt states she is going to [place] 01/30/2017- 02/03/2017, has metal in her feet and needs a note for the [employer]. I informed pt I would type the note for the [employer] and she should call with how she would like the letter to be picked up or mailed. Faxed letter to pt.

## 2017-01-02 NOTE — Telephone Encounter (Signed)
I'm calling back for the fax number in regards to requested letter. My work fax number is [phone] or alternate fax number [phone]

## 2017-01-03 ENCOUNTER — Telehealth: Payer: Self-pay | Admitting: Podiatry

## 2017-01-03 NOTE — Telephone Encounter (Signed)
Hi valery, I'm calling you back with two different fax numbers to fax the letter to. The numbers are [phone] or [phone]. If you could please call and let me know that you have faxed it. I will be looking for it. Thank you.

## 2017-01-03 NOTE — Telephone Encounter (Signed)
I spoke with pt and she states she didn't get the fax even though I have confirmation it was received. Pt states fax to directly to her office, lots of people use the other fax.

## 2017-02-22 ENCOUNTER — Ambulatory Visit: Payer: BLUE CROSS/BLUE SHIELD | Admitting: Pulmonary Disease

## 2017-05-02 ENCOUNTER — Ambulatory Visit: Payer: BLUE CROSS/BLUE SHIELD | Admitting: Podiatry

## 2017-05-22 LAB — HM COLONOSCOPY

## 2017-05-30 ENCOUNTER — Ambulatory Visit: Payer: BLUE CROSS/BLUE SHIELD | Admitting: Podiatry

## 2017-09-27 ENCOUNTER — Encounter: Payer: Self-pay | Admitting: Pulmonary Disease

## 2017-09-27 ENCOUNTER — Ambulatory Visit (INDEPENDENT_AMBULATORY_CARE_PROVIDER_SITE_OTHER): Payer: BLUE CROSS/BLUE SHIELD | Admitting: Pulmonary Disease

## 2017-09-27 VITALS — BP 130/78 | HR 82 | Ht 69.5 in | Wt 201.0 lb

## 2017-09-27 DIAGNOSIS — R0600 Dyspnea, unspecified: Secondary | ICD-10-CM | POA: Diagnosis not present

## 2017-09-27 NOTE — Patient Instructions (Signed)
I am glad you are doing well You do not need any inhalers as your breathing is doing okay You can follow-up with [place] as needed Please call us if there is any change in his symptoms.

## 2017-09-27 NOTE — Progress Notes (Signed)
Julia Holt    [id number]    [birth year]  Primary Care Physician:Jobe, Alexis Goodell., MD  Referring Physician: Lilian Coma., MD No address on file  Chief complaint:  Follow up for evaluation of dyspnea, abnormal PFTs, chest x-ray  HPI: Julia Holt is a 57 year old with past medical history of hypertension, diabetes. She had an admission in December 2017 for hyperosmolar coma, community acquired pneumonia. She was in shock during that admission and was intubated briefly. Postadmission she has mild dyspnea on exertion. She denies dyspnea at rest, cough, mucus production, wheezing, fevers, chills. She has history of scoliosis and had harrington rods placed as a child. She has no smoking history but hadn't been exposed to secondhand smoke. 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 She does not report any exposures at work or at home.  She did recent chest x-ray which is read as hyperinflation and pulmonary function tests which showed minimal obstruction and small airways disease. She is here for further evaluation.  Interim history: Doing well.  She had an episode of dyspnea during a hot day in summer last year otherwise no respiratory symptoms.  Outpatient Encounter Medications as of 09/27/2017  Medication Sig  . amLODipine (NORVASC) 10 MG tablet Take 10 mg by mouth daily.  Marland Kitchen aspirin 81 MG chewable tablet Chew 1 tablet (81 mg total) by mouth daily.  . fluvoxaMINE (LUVOX) 100 MG tablet Take 100 mg by mouth at bedtime.  Marland Kitchen glucose blood test strip 1 each by Other route as needed for other. Use as instructed  . Insulin Human (INSULIN PUMP) SOLN Inject into the skin. Husband has no idea what kind of insulin she takes. Husband states she is on a insulin pump. Pharmacy has a record of Novolog 60 units twice daily.  Marland Kitchen levothyroxine (SYNTHROID, LEVOTHROID) 137 MCG tablet Take 137 mcg by mouth daily before breakfast.  . quinapril (ACCUPRIL) 20 MG tablet Take 20 mg  by mouth at bedtime.  Marland Kitchen spironolactone (ALDACTONE) 25 MG tablet Take 25 mg by mouth daily.   No facility-administered encounter medications on file as of 09/27/2017.     Allergies as of 09/27/2017 - Review Complete 09/27/2017  Allergen Reaction Noted  . Sulfa antibiotics Hives and Itching 05/03/2013    Past Medical History:  Diagnosis Date  . Diabetes mellitus without complication ([place])   . Hypertension   . Thyroid disease     Past Surgical History:  Procedure Laterality Date  . BACK SURGERY    . BUNIONECTOMY Left     Family History  Problem Relation Age of Onset  . Hypertension Mother   . Hyperlipidemia Mother   . Dementia Mother   . Alzheimer's disease Father     Social History   Socioeconomic History  . Marital status: Married    Spouse name: Not on file  . Number of children: Not on file  . Years of education: Not on file  . Highest education level: Not on file  Occupational History  . Not on file  Social Needs  . Financial resource strain: Not on file  . Food insecurity:    Worry: Not on file    Inability: Not on file  . Transportation needs:    Medical: Not on file    Non-medical: Not on file  Tobacco Use  . Smoking status: Never Smoker  . Smokeless tobacco: Never Used  Substance and Sexual Activity  . Alcohol use: No  .  Drug use: No  . Sexual activity: Not on file  Lifestyle  . Physical activity:    Days per week: Not on file    Minutes per session: Not on file  . Stress: Not on file  Relationships  . Social connections:    Talks on phone: Not on file    Gets together: Not on file    Attends religious service: Not on file    Active member of club or organization: Not on file    Attends meetings of clubs or organizations: Not on file    Relationship status: Not on file  . Intimate partner violence:    Fear of current or ex partner: Not on file    Emotionally abused: Not on file    Physically abused: Not on file    Forced sexual activity:  Not on file  Other Topics Concern  . Not on file  Social History Narrative  . Not on file    Review of systems: Review of Systems  Constitutional: Negative for fever and chills.  HENT: Negative.   Eyes: Negative for blurred vision.  Respiratory: as per HPI  Cardiovascular: Negative for chest pain and palpitations.  Gastrointestinal: Negative for vomiting, diarrhea, blood per rectum. Genitourinary: Negative for dysuria, urgency, frequency and hematuria.  Musculoskeletal: Negative for myalgias, back pain and joint pain.  Skin: Negative for itching and rash.  Neurological: Negative for dizziness, tremors, focal weakness, seizures and loss of consciousness.  Endo/Heme/Allergies: Negative for environmental allergies.  Psychiatric/Behavioral: Negative for depression, suicidal ideas and hallucinations.  All other systems reviewed and are negative.  Physical Exam: Blood pressure 130/78, pulse 82, height 5' 9.5" (1.765 m), weight 201 lb (91.2 kg), SpO2 95 %. Gen:      No acute distress HEENT:  EOMI, sclera anicteric Neck:     No masses; no thyromegaly Lungs:    Clear to auscultation bilaterally; normal respiratory effort CV:         Regular rate and rhythm; no murmurs Abd:      + bowel sounds; soft, non-tender; no palpable masses, no distension Ext:    No edema; adequate peripheral perfusion Skin:      Warm and dry; no rash Neuro: alert and oriented x 3 Psych: normal mood and affect  Data Reviewed: PFTs 07/18/16 FVC 3.22 (78%), FEV1 2.47 [77%), F/F 77, TLC 93% , DLCO 88% Minimal obstructive airway disease, small airway disease.  Chest x-ray 06/30/16-hyperinflation. No acute infiltrate. I have reviewed the images personally.  Alpha-1 antitrypsin 09/01/16- 191, PI MM  Assessment:  Evaluation for abnormal chest x-ray, PFTs There is very minimal obstruction on spirometry which may indicate mild reactive airway disease. Although the chest x-ray shows some hyperinflation and I do not  suspect COPD she is a lifelong nonsmoker. Her symptoms are stable and I don't believe she needs to be on any inhalers currently.   Plan/Recommendations: - Follow-up as needed.  Marshell Garfinkel MD [facility] 09/27/2017, 4:26 PM  CC: Lilian Coma., MD

## 2017-10-25 ENCOUNTER — Telehealth: Payer: Self-pay | Admitting: Podiatry

## 2017-10-25 NOTE — Telephone Encounter (Signed)
I've had three surgeries by both Dr. Blenda Mounts and Dr. Jacqualyn Posey. They have been good but I didn't see the results. My feet are worse than when I had my surgery. I'm requesting to get a copy of my medical records. If you could please tell me what I need to do to get them released to me. I'm going to another practice. Phone number is [phone]. Thank you. Bye bye.

## 2017-10-25 NOTE — Telephone Encounter (Signed)
Called pt to let her know I would need her to fill out and sign a medical records release form. Pt requested I e-mail the form to her at [email or web address]. I told the pt she would need to fill it out and physically sign and get back to me.

## 2017-10-30 ENCOUNTER — Telehealth: Payer: Self-pay | Admitting: Podiatry

## 2017-10-30 NOTE — Telephone Encounter (Signed)
This is Julia Holt. I'm calling Janett Billow in Medical Records. I spoke to you last week that I'm going for a second opinion and I needed a copy of the medical records release form so I can fill it out and sign it to obtain a CD of my x-rays. I have not received that form and I need to have that this week because my appointment is next week. If you could please fax that release to me at [phone] or again you can e-mail it to me at [email or web address]. If you e-mailed it to me I never got it, and it did not show up in spam. If you could please help me out I sure would appreciate it. You can give me a call back at [phone]. Thank you. Bye bye.

## 2017-10-31 ENCOUNTER — Telehealth: Payer: Self-pay | Admitting: Podiatry

## 2017-10-31 NOTE — Telephone Encounter (Signed)
Called pt and left a voicemail to see if she received the medical records release form via fax yesterday. I told her I e-mailed the form last week to the e-mail address she gave me but I got an e-mail back saying it was not a valid e-mail address and that I may have entered it wrong. Told the pt she could call me back directly at [phone].

## 2017-11-02 ENCOUNTER — Telehealth: Payer: Self-pay | Admitting: Podiatry

## 2017-11-02 NOTE — Telephone Encounter (Signed)
Called pt back and asked when she faxed the form back. Pt stated she faxed it back on Tuesday to the fax number of, pt stated she could not find the form and asked me to fax it back to her. I confirmed the fax number of [phone]. I went ahead and re-faxed the medical records release form to [employer]. I told her on the form to write the fax number of where she wants it faxed to as well as her e-mail address.

## 2017-11-02 NOTE — Telephone Encounter (Signed)
Janett Billow, this is Delinda Malan calling you back. I've tried 8 times trying to fax in this signed release and it's not going through. It keeps going through busy. Can you call me back and give me alternatives on how to get this to you. I'll wait to hear back from you. Thank you. Bye bye.

## 2017-11-02 NOTE — Telephone Encounter (Signed)
Called pt back and left a voicemail letting her know she could e-mail the signed release form to me at [email or web address] or she could fax it to the alternate fax number of [phone]. If you need to speak with me you can call me back directly at [phone].

## 2017-11-02 NOTE — Telephone Encounter (Signed)
This message is for the medical records department. I faxed in a signed request for the release of my medical records. I am going for a second opinion the first of June. I had a couple of questions about it. I wanted to make sure medical records got it. Also, what is the process of them getting to me etc.? If somebody could call me at [phone]. Thank you so much. Bye bye.

## 2017-11-03 ENCOUNTER — Encounter: Payer: Self-pay | Admitting: Podiatry

## 2017-11-03 NOTE — Progress Notes (Signed)
Pt's requested medical records were e-mailed to her at [email or web address] per her request.

## 2017-11-14 ENCOUNTER — Telehealth: Payer: Self-pay | Admitting: Podiatry

## 2017-11-14 NOTE — Telephone Encounter (Signed)
I called Julia Holt back and spoke to her about her medical records. I let her know that I e-mailed her records on 17 May to her personal e-mail address of [email or web address] because it was a total of 65 pages and I didn't know if she would want that faxed. Pt stated she deleted it and asked me to e-mail them to that same e-mail address again. I told her that I would. I told the pt I would be happy to do the CD of x-rays for her but that she would either need to come and pick them up in the office, or I could either mail them to her or the physician's office she is going to. Pt stated she was just going to hold off on her x-rays because they would probably take some themselves. I told her to give me a few minutes and I would e-mail those records back to her.

## 2017-11-14 NOTE — Telephone Encounter (Signed)
I never got those records that you said you sent. I think I told you to send it to my direct fax at [phone] and I don't have them yet. I also sent an e-mail back to see if I could pay $5.00 to get a CD of my x-rays. If you can please call me. I have an upcoming appointment and I want to have all this information. Maybe you faxed it to a different number, I'm not sure. If you can just give me a call back please at [phone]. My work number, I will be here until 5 is [phone] and just ask for [place]. Thanks a lot. Bye bye.

## 2017-12-18 IMAGING — CR DG CHEST 1V PORT
1 series · 1 of 1 positions shown · non-contrast
Comparison: None.

CLINICAL DATA: Altered mental status.

EXAM:
PORTABLE CHEST 1 VIEW

[AP]
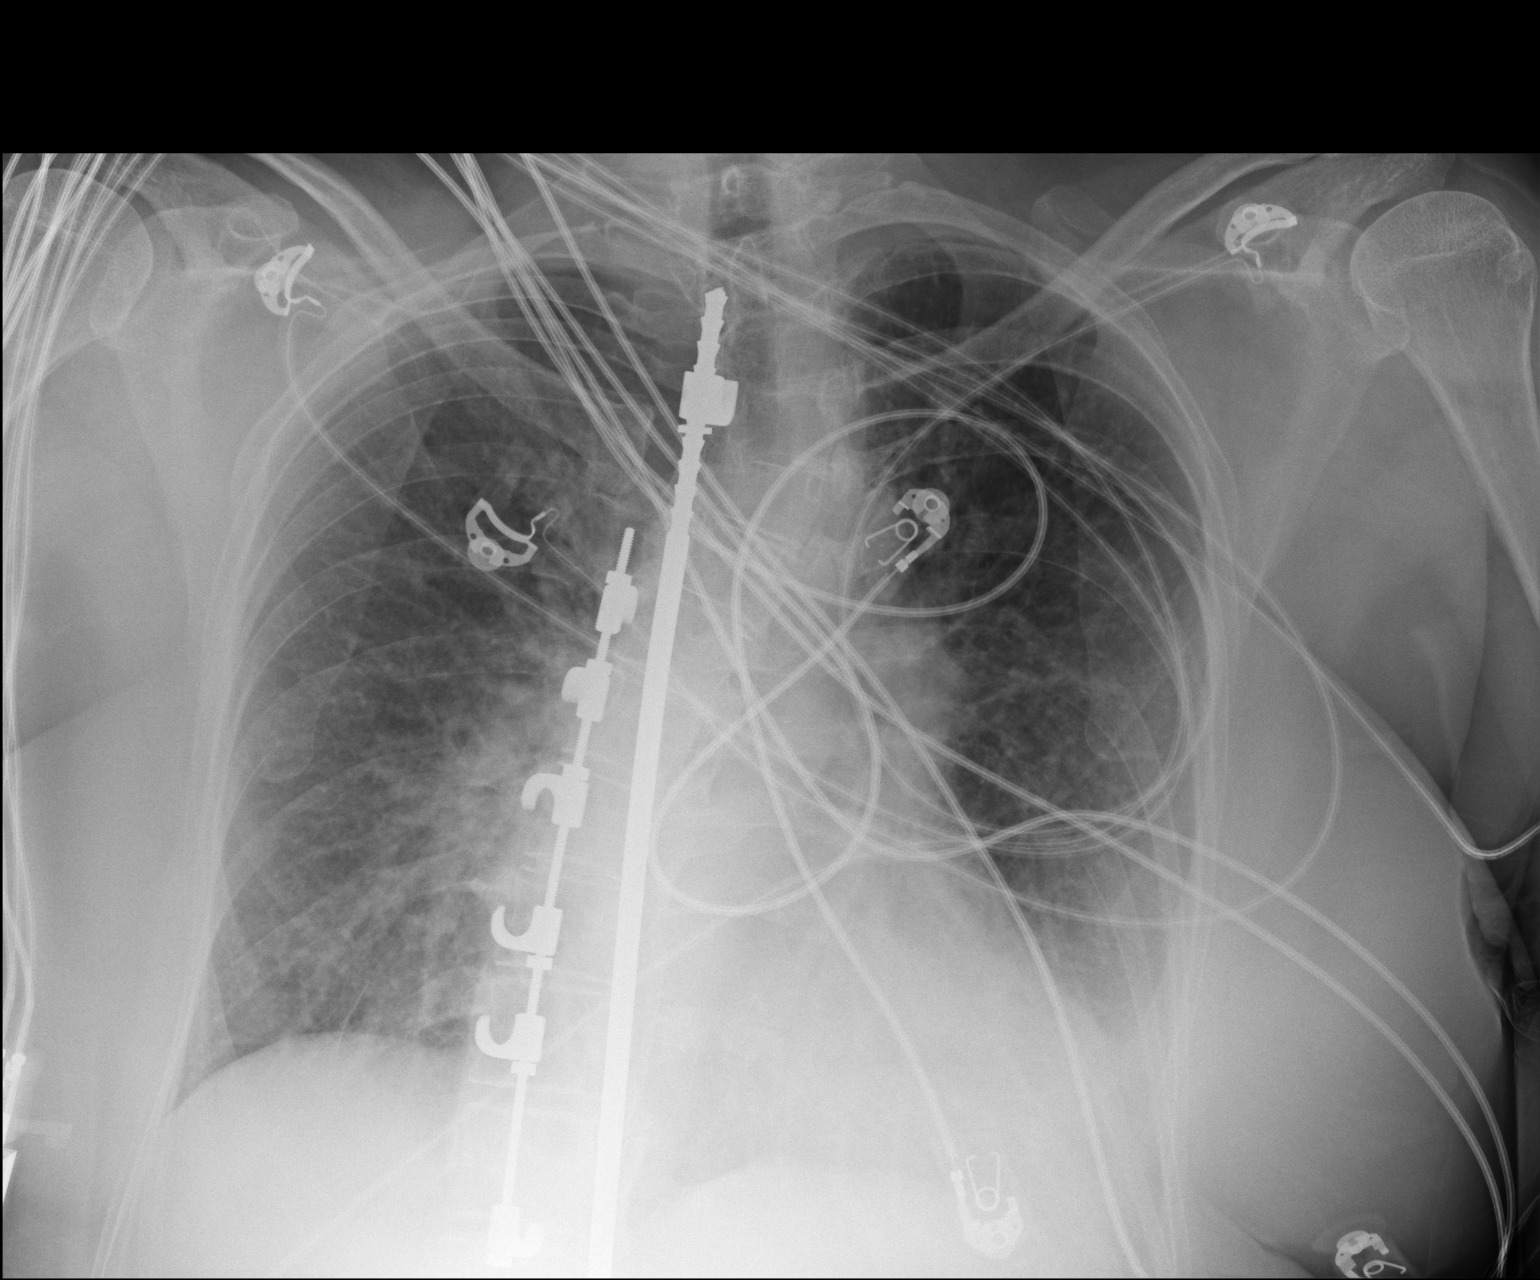

[1 of 1 positions shown; findings below may reference images not displayed]

FINDINGS: Scoliosis rods are identified overlying the thoracic and upper
lumbar spine. The heart size appears normal. There is decreased
aeration to the left lung base which may reflect atelectasis,
infiltrate or asymmetric edema.
IMPRESSION: 1. Diminished aeration to left base as above. Cannot rule out
pneumonia.

## 2017-12-18 IMAGING — CR DG ABD PORTABLE 1V
1 series · 1 of 1 positions shown · non-contrast
Comparison: CT 06/02/2016

CLINICAL DATA: OG tube placement

EXAM:
PORTABLE ABDOMEN - 1 VIEW

[AP]
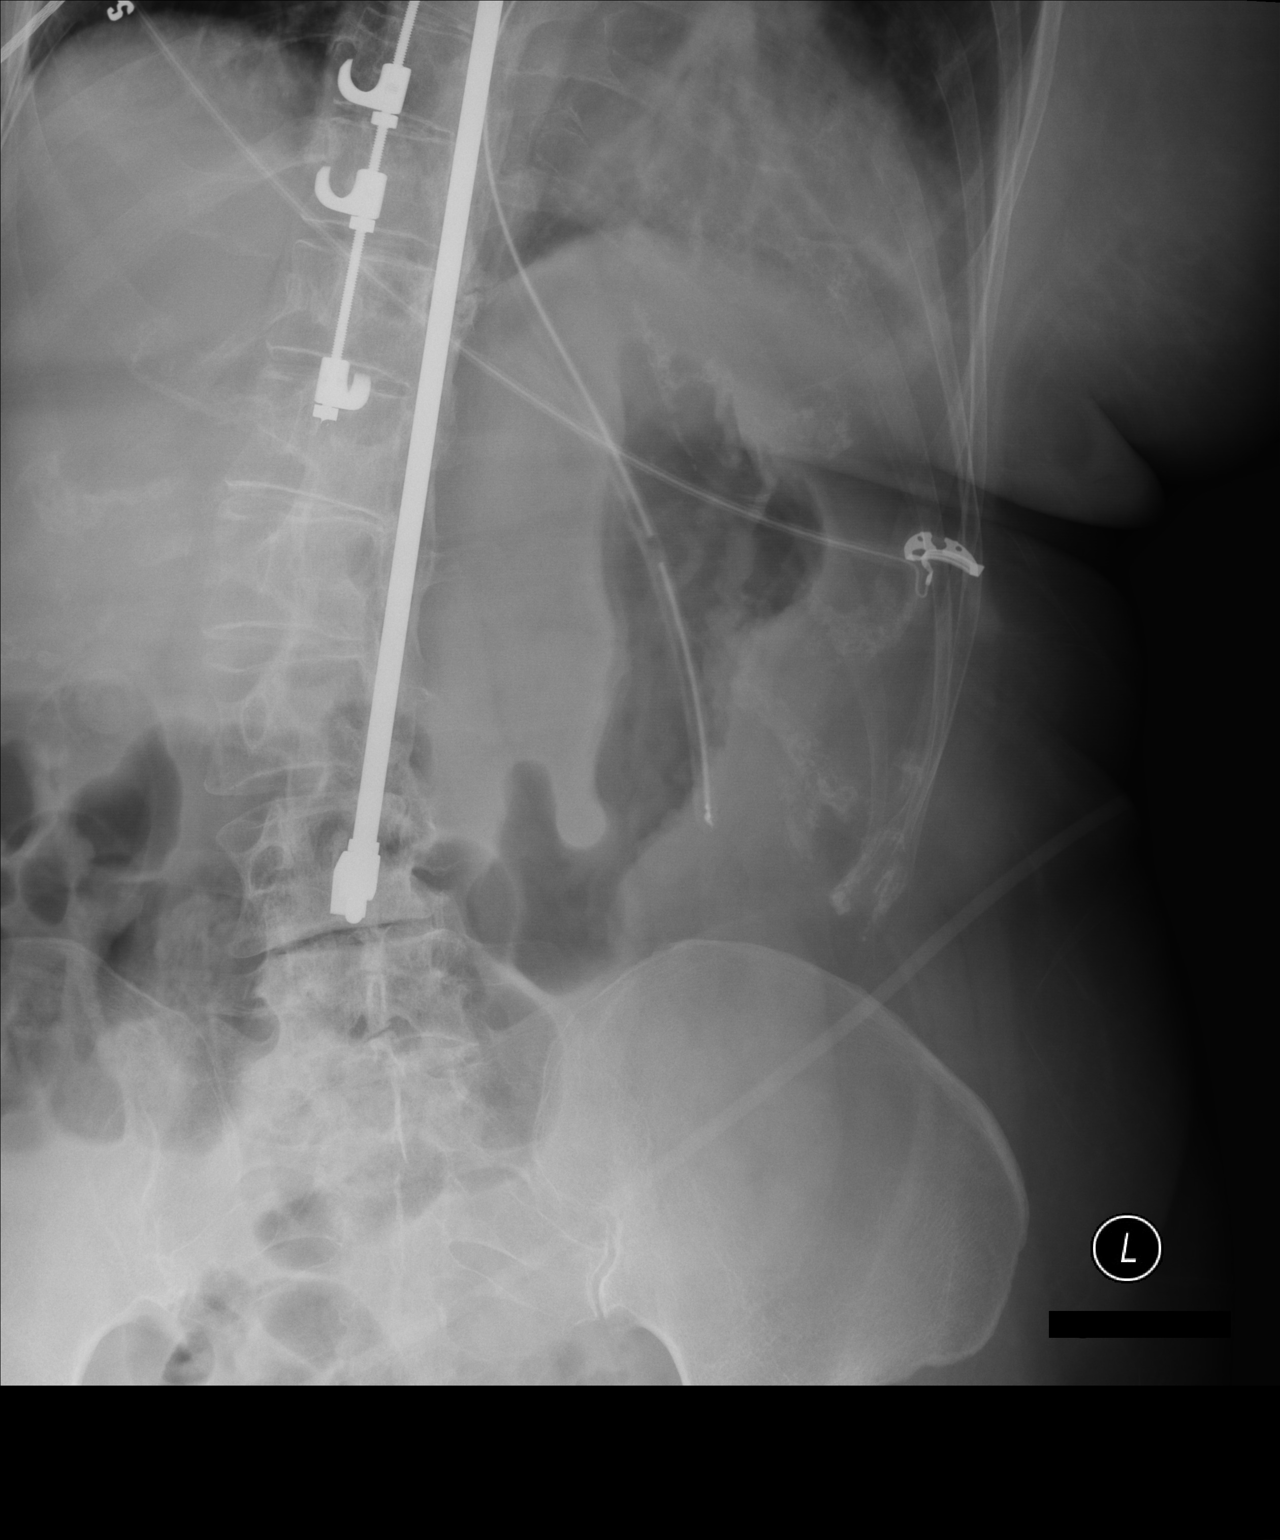

[1 of 1 positions shown; findings below may reference images not displayed]

FINDINGS: Partially visualized spinal hardware. Esophageal tube tip projects
over the stomach. Atelectasis or infiltrate left base. Upper
bowel-gas pattern unremarkable.
IMPRESSION: Esophageal tube tip overlies the stomach

## 2017-12-18 IMAGING — DX DG CHEST 1V PORT
1 series · 1 of 1 positions shown · non-contrast
Comparison: Single-view of the chest 12/scratch the single view of
the chest earlier today

CLINICAL DATA: Status post intubation today

EXAM:
PORTABLE CHEST 1 VIEW

[chest ap]
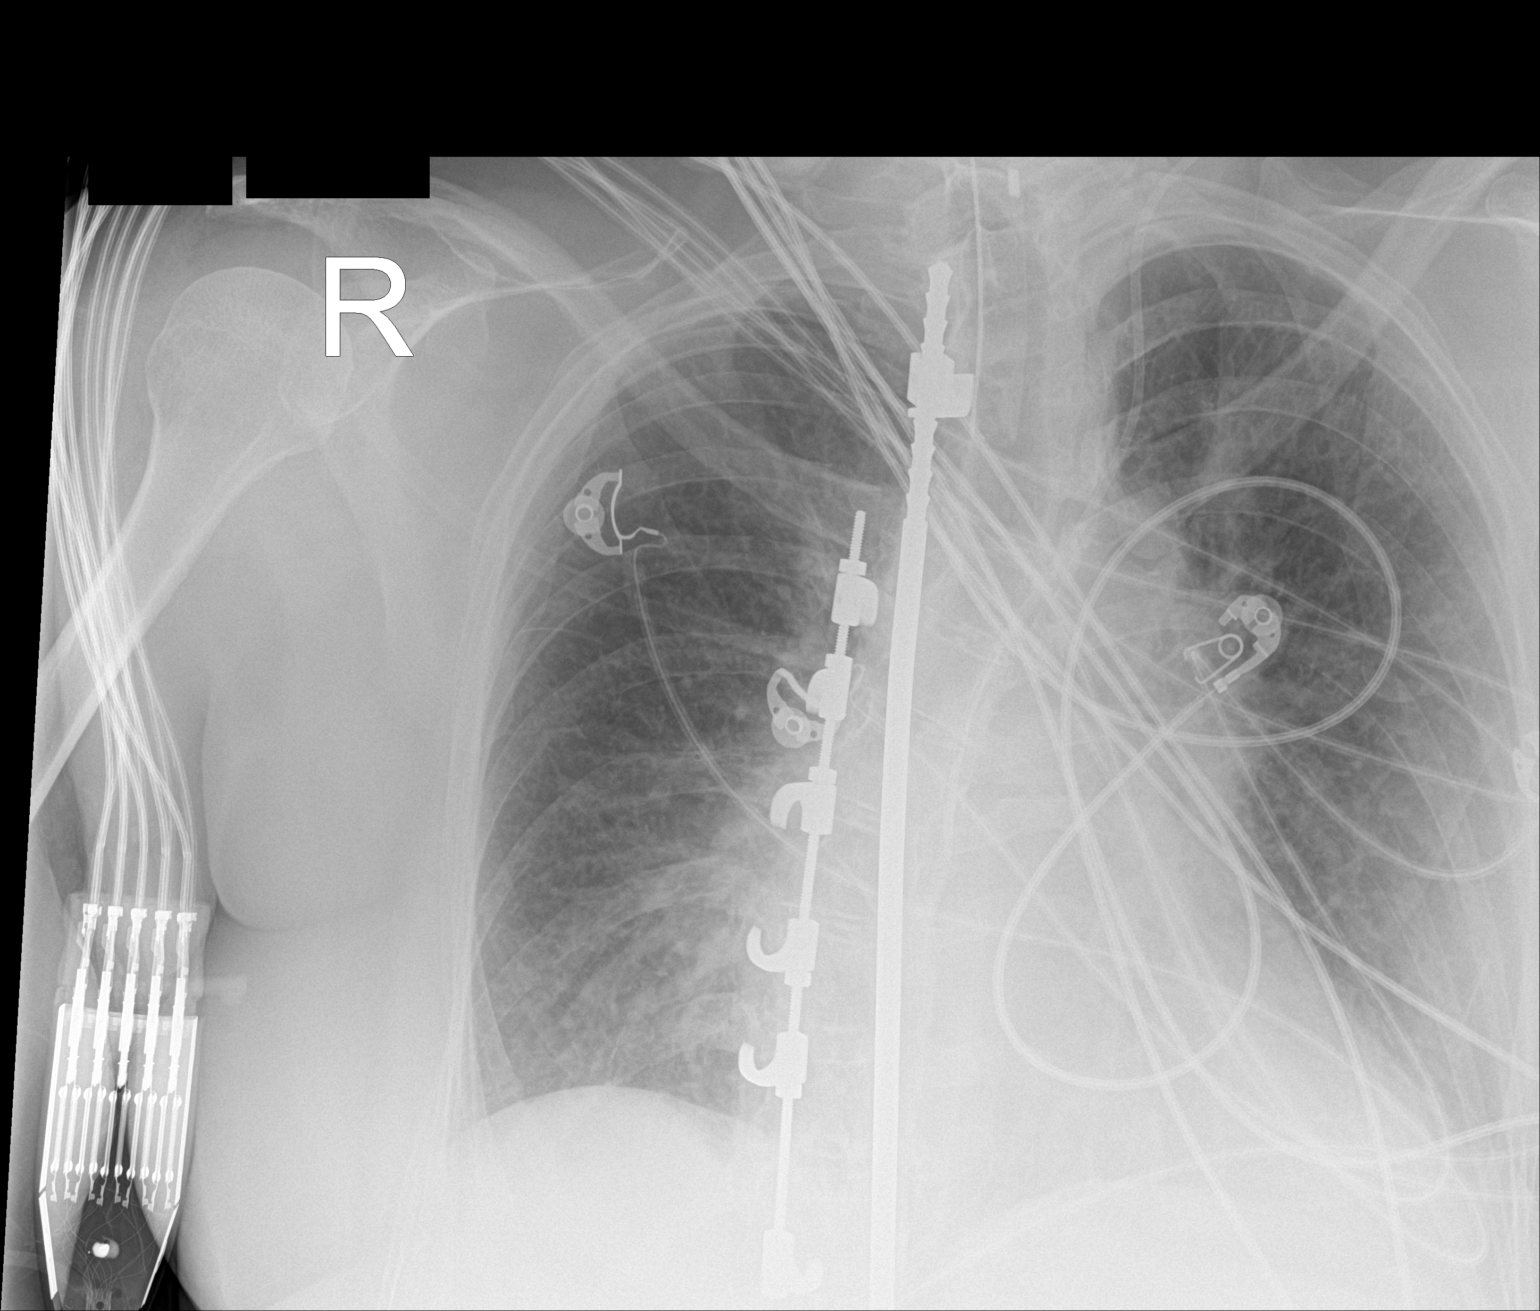

[1 of 1 positions shown; findings below may reference images not displayed]

FINDINGS: Endotracheal tube is in place with the tip just above the clavicular
heads, well above the carina. New left IJ catheter tip projects near
the superior cavoatrial junction. No pneumothorax. Bibasilar
airspace opacities are unchanged. Marked scoliosis noted.
IMPRESSION: ETT and IJ catheter projecting good position. No pneumothorax or
other change.

## 2017-12-19 IMAGING — CR DG CHEST 1V PORT
1 series · 1 of 1 positions shown · non-contrast
Comparison: Portable chest x-ray June 02, 2016

CLINICAL DATA: Respiratory failure, shortness of breath

EXAM:
PORTABLE CHEST 1 VIEW

[AP]
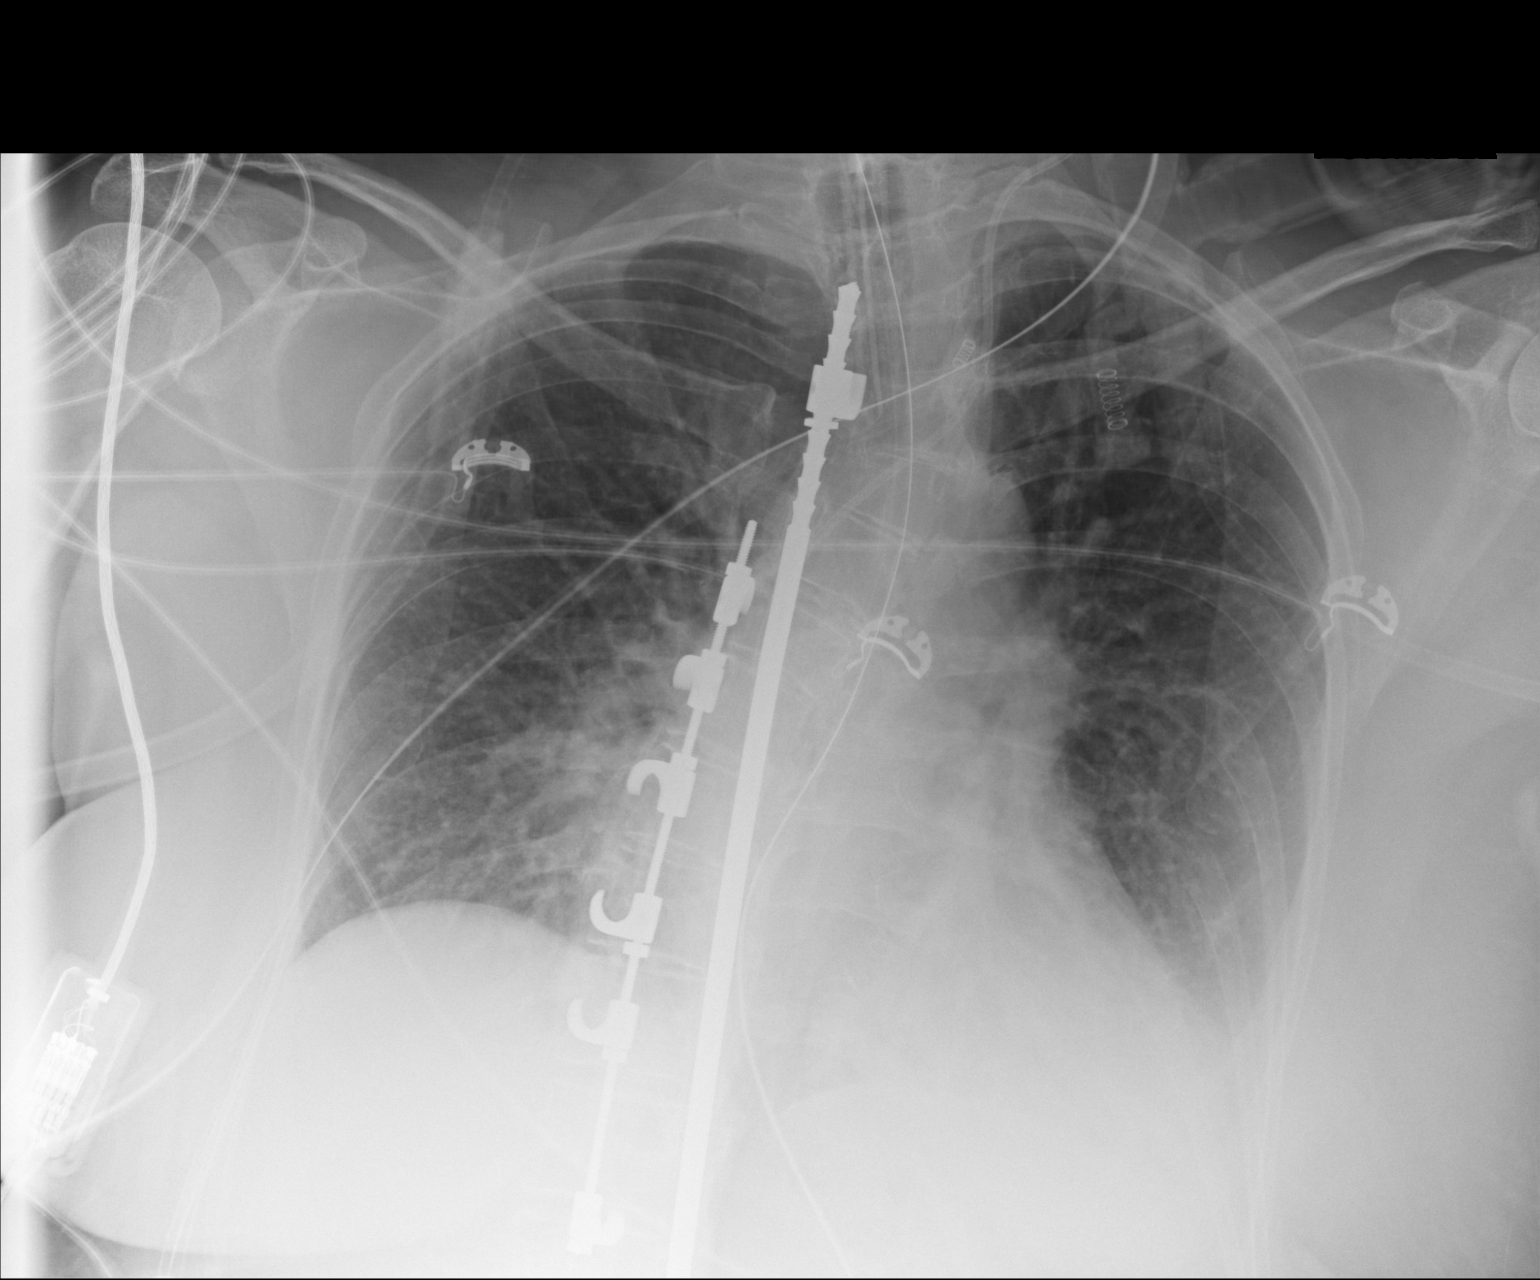

[1 of 1 positions shown; findings below may reference images not displayed]

FINDINGS: The lungs are adequately inflated. There remains increased density
at the left lung base. There is partial obscuration of the left
hemidiaphragm. The interstitial markings are coarse though stable.
The cardiac silhouette is top-normal in size. The pulmonary
vascularity is normal. The endotracheal tube tip lies approximately
5.7 cm of above the carina. The tip is at the level of the
clavicular heads. The esophagogastric tube tip and proximal port
project below the inferior margin of the image. The left internal
jugular venous catheter tip projects over the midportion of the SVC.
IMPRESSION: Stable appearance of the chest since yesterday's study. Left lower
lobe atelectasis or pneumonia and probable small left pleural
effusion. Somewhat high positioning of the endotracheal tube.
Correlation with the type of tube present is needed to assess the
adequacy of this positioning.

## 2018-02-01 DIAGNOSIS — M189 Osteoarthritis of first carpometacarpal joint, unspecified: Secondary | ICD-10-CM | POA: Insufficient documentation

## 2018-03-27 ENCOUNTER — Telehealth: Payer: Self-pay | Admitting: Podiatry

## 2018-03-27 NOTE — Telephone Encounter (Signed)
I called the pt back to let her know the CD of x-rays was up front and ready for pick up. I told her I needed her to fill out and sign another release form. Pt requested I fax the form to the fax number of [phone].

## 2018-03-27 NOTE — Telephone Encounter (Signed)
Called pt and left a voicemail letting her know that I can get her x-rays ready for her by Friday. I asked her to call me back at [phone] to confirm if she only wants her x-ray images taken by Dr. Jacqualyn Posey or all the way back when she was seeing Dr. Blenda Mounts. Also told pt she would need to fill out and sign a new medical records release form.

## 2018-03-27 NOTE — Telephone Encounter (Signed)
I'm going to need all of my x-rays from when I first saw Dr. Blenda Mounts to when I last saw Dr. Jacqualyn Posey. Please call and let me know that its being or has been taken care of. Thank you so much.

## 2018-03-27 NOTE — Telephone Encounter (Signed)
I'm going for a second opinion on Monday and I'm wanting my husband to pick up a disc of my x-rays. I already have my records from before. I need the images no later than Friday. If you could please call me back and let me know this is feasible.

## 2018-03-29 ENCOUNTER — Encounter: Payer: Self-pay | Admitting: Podiatry

## 2018-03-29 NOTE — Progress Notes (Signed)
Medical records and disc of x-rays were placed up front for pt's husband to pick up.

## 2018-04-12 DIAGNOSIS — M1711 Unilateral primary osteoarthritis, right knee: Secondary | ICD-10-CM | POA: Insufficient documentation

## 2018-04-23 ENCOUNTER — Other Ambulatory Visit: Payer: Self-pay | Admitting: Psychiatry

## 2018-05-10 ENCOUNTER — Encounter: Payer: Self-pay | Admitting: Psychiatry

## 2018-05-10 ENCOUNTER — Ambulatory Visit (INDEPENDENT_AMBULATORY_CARE_PROVIDER_SITE_OTHER): Payer: BLUE CROSS/BLUE SHIELD | Admitting: Psychiatry

## 2018-05-10 DIAGNOSIS — F331 Major depressive disorder, recurrent, moderate: Secondary | ICD-10-CM

## 2018-05-10 DIAGNOSIS — F40240 Claustrophobia: Secondary | ICD-10-CM

## 2018-05-10 DIAGNOSIS — F422 Mixed obsessional thoughts and acts: Secondary | ICD-10-CM | POA: Diagnosis not present

## 2018-05-10 MED ORDER — BUPROPION HCL ER (XL) 300 MG PO TB24: 300.0000 mg | ORAL_TABLET | Freq: Every day | ORAL | 1 refills | Status: DC

## 2018-05-10 MED ORDER — FLUVOXAMINE MALEATE 100 MG PO TABS: 100.0000 mg | ORAL_TABLET | Freq: Two times a day (BID) | ORAL | 1 refills | Status: DC

## 2018-05-10 NOTE — Progress Notes (Signed)
ADELIS DOCTER [id number] Oct 03, 1960 57 y.o.  Subjective:   Patient ID:  Julia Holt is a 57 y.o. (DOB 1961/03/15) female.  Chief Complaint:  Chief Complaint  Patient presents with  . Follow-up    depression and anxiety    HPI Julia Holt presents to the office today for follow-up of the above. Was OK until last Tuesday...going to be losing her job.  Restructuring.  Has been there 11 years.  Started crying over it.  Not going to move to where the new job is located.  [employer] Husband has business here.  Started looking for a new job.  Faith helps some. Scary.  [employer] Birks is leaving on Tuesday for another job.  Will have a job here until early Spring unless she finds another job first.  Overall not really depressed and anxious otherwise.  Generally satisfied with meds.  Worry about age discrimination  In a new job.     Review of Systems:  Review of Systems  Neurological: Negative for tremors and weakness.  Psychiatric/Behavioral: Negative for agitation, behavioral problems, confusion, decreased concentration, dysphoric mood, hallucinations, self-injury, sleep disturbance and suicidal ideas. The patient is nervous/anxious. The patient is not hyperactive.     Medications: I have reviewed the patient's current medications.  Current Outpatient Medications  Medication Sig Dispense Refill  . amLODipine (NORVASC) 10 MG tablet Take 10 mg by mouth daily.    Marland Kitchen aspirin 81 MG chewable tablet Chew 1 tablet (81 mg total) by mouth daily.    Marland Kitchen buPROPion (WELLBUTRIN XL) 300 MG 24 hr tablet Take 1 tablet (300 mg total) by mouth daily. 90 tablet 1  . Cholecalciferol (VITAMIN D) 50 MCG (2000 UT) tablet Take 2,000 Units by mouth daily.    . fluvoxaMINE (LUVOX) 100 MG tablet Take 1 tablet (100 mg total) by mouth 2 (two) times daily. 180 tablet 1  . Insulin Human (INSULIN PUMP) SOLN Inject into the skin. Husband has no idea what kind of insulin she takes. Husband states she is on a insulin pump. Pharmacy has a  record of Novolog 60 units twice daily.    Marland Kitchen levothyroxine (SYNTHROID, LEVOTHROID) 137 MCG tablet Take 137 mcg by mouth daily before breakfast.    . quinapril (ACCUPRIL) 20 MG tablet Take 20 mg by mouth at bedtime.    Marland Kitchen spironolactone (ALDACTONE) 25 MG tablet Take 25 mg by mouth daily.    Marland Kitchen glucose blood test strip 1 each by Other route as needed for other. Use as instructed     No current facility-administered medications for this visit.     Medication Side Effects: None  Allergies:  Allergies  Allergen Reactions  . Sulfa Antibiotics Hives and Itching    Past Medical History:  Diagnosis Date  . Diabetes mellitus without complication ([place])   . Hypertension   . Thyroid disease     Family History  Problem Relation Age of Onset  . Hypertension Mother   . Hyperlipidemia Mother   . Dementia Mother   . Alzheimer's disease Father     Social History   Socioeconomic History  . Marital status: Married    Spouse name: Not on file  . Number of children: Not on file  . Years of education: Not on file  . Highest education level: Not on file  Occupational History  . Not on file  Social Needs  . Financial resource strain: Not on file  . Food insecurity:    Worry: Not on file  Inability: Not on file  . Transportation needs:    Medical: Not on file    Non-medical: Not on file  Tobacco Use  . Smoking status: Never Smoker  . Smokeless tobacco: Never Used  Substance and Sexual Activity  . Alcohol use: No  . Drug use: No  . Sexual activity: Not on file  Lifestyle  . Physical activity:    Days per week: Not on file    Minutes per session: Not on file  . Stress: Not on file  Relationships  . Social connections:    Talks on phone: Not on file    Gets together: Not on file    Attends religious service: Not on file    Active member of club or organization: Not on file    Attends meetings of clubs or organizations: Not on file    Relationship status: Not on file  .  Intimate partner violence:    Fear of current or ex partner: Not on file    Emotionally abused: Not on file    Physically abused: Not on file    Forced sexual activity: Not on file  Other Topics Concern  . Not on file  Social History Narrative  . Not on file    Past Medical History, Surgical history, Social history, and Family history were reviewed and updated as appropriate.   Please see review of systems for further details on the patient's review from today.   Objective:   Physical Exam:  There were no vitals taken for this visit.  Physical Exam  Constitutional: She is oriented to person, place, and time. She appears well-developed. No distress.  Musculoskeletal: She exhibits no deformity.  Neurological: She is alert and oriented to person, place, and time. She displays no tremor. Coordination and gait normal.  Psychiatric: Her speech is normal and behavior is normal. Judgment and thought content normal. Her mood appears anxious. Her affect is not angry, not blunt, not labile and not inappropriate. Cognition and memory are normal. She does not exhibit a depressed mood. She expresses no homicidal and no suicidal ideation. She expresses no suicidal plans and no homicidal plans.  Insight intact. No auditory or visual hallucinations.  Tearful over the job loss.    Lab Review:     Component Value Date/Time   NA 141 06/06/2016 0416   K 3.7 06/06/2016 0416   CL 104 06/06/2016 0416   CO2 26 06/06/2016 0416   GLUCOSE 240 (H) 06/06/2016 0416   BUN 9 06/06/2016 0416   CREATININE 0.86 06/06/2016 0416   CALCIUM 8.4 (L) 06/06/2016 0416   PROT 5.8 (L) 06/05/2016 0618   ALBUMIN 3.2 (L) 06/05/2016 0618   AST 142 (H) 06/05/2016 0618   ALT 207 (H) 06/05/2016 0618   ALKPHOS 90 06/05/2016 0618   BILITOT 2.4 (H) 06/05/2016 0618   GFRNONAA >60 06/06/2016 0416   GFRAA >60 06/06/2016 0416       Component Value Date/Time   WBC 8.3 06/06/2016 0416   RBC 3.53 (L) 06/06/2016 0416   HGB  11.4 (L) 06/06/2016 0416   HCT 33.0 (L) 06/06/2016 0416   PLT 163 06/06/2016 0416   MCV 93.5 06/06/2016 0416   MCH 32.3 06/06/2016 0416   MCHC 34.5 06/06/2016 0416   RDW 13.5 06/06/2016 0416   LYMPHSABS 1.7 06/04/2016 0448   MONOABS 0.6 06/04/2016 0448   EOSABS 0.0 06/04/2016 0448   BASOSABS 0.0 06/04/2016 0448    No results found for: POCLITH, LITHIUM  No results found for: PHENYTOIN, PHENOBARB, VALPROATE, CBMZ   .res Assessment: Plan:    Major depressive disorder, recurrent episode, moderate ([place])  Mixed obsessional thoughts and acts  Claustrophobia  Overall Julia Holt is doing well with the exception of the stress of the pending job loss due to restructuring.  Her last episode of depression occurred in July and we increase the Wellbutrin to the current dose and it resolved.  She is tolerating the medications well Supportive therapy re job loss and "fear of the unknown" and problem solving for the next steps.   Satisfied with the medications.  This was a 20-minute appointment  Follow-up 4 months.  Call if need to be seen sooner.  Lynder Parents MD, DFAPA   Please see After Visit Summary for patient specific instructions.  No future appointments.  No orders of the defined types were placed in this encounter.     -------------------------------

## 2018-06-28 LAB — HM PAP SMEAR

## 2018-07-21 DIAGNOSIS — E782 Mixed hyperlipidemia: Secondary | ICD-10-CM | POA: Insufficient documentation

## 2018-07-21 DIAGNOSIS — E1069 Type 1 diabetes mellitus with other specified complication: Secondary | ICD-10-CM | POA: Insufficient documentation

## 2018-07-21 DIAGNOSIS — F4323 Adjustment disorder with mixed anxiety and depressed mood: Secondary | ICD-10-CM | POA: Insufficient documentation

## 2018-09-05 ENCOUNTER — Other Ambulatory Visit: Payer: Self-pay

## 2018-09-05 ENCOUNTER — Ambulatory Visit: Payer: BLUE CROSS/BLUE SHIELD | Admitting: Psychiatry

## 2018-09-05 ENCOUNTER — Encounter: Payer: Self-pay | Admitting: Psychiatry

## 2018-09-05 DIAGNOSIS — F331 Major depressive disorder, recurrent, moderate: Secondary | ICD-10-CM | POA: Diagnosis not present

## 2018-09-05 DIAGNOSIS — F40240 Claustrophobia: Secondary | ICD-10-CM

## 2018-09-05 DIAGNOSIS — F422 Mixed obsessional thoughts and acts: Secondary | ICD-10-CM

## 2018-09-05 MED ORDER — PROPRANOLOL HCL 20 MG PO TABS: 20.0000 mg | ORAL_TABLET | Freq: Two times a day (BID) | ORAL | 1 refills | Status: DC | PRN

## 2018-09-05 NOTE — Progress Notes (Signed)
BEVERELY SUEN [id number] 06/04/1961 58 y.o.  Subjective:   Patient ID:  Julia Holt is a 58 y.o. (DOB Aug 19, 1960) female.  Chief Complaint:  Chief Complaint  Patient presents with  . Follow-up    Medication management  . Anxiety    HPI Julia Holt presents to the office today for follow-up of psychiatric diagnoses.  Last seen May 10, 2018.  No med changes were made at that time.  Changed her mind and decided to keep her job even though it means an hour commute .  Comfortable with the decision but doesn't like it. And it generates more anxiety than usual.   Anticipate change May.    Stress M in quarantine at [facility] and falling a lot.  Has dementia and doesn't recognize her.  Patient reports stable mood and denies depressed or irritable moods.    Patient denies difficulty with sleep initiation or maintenance. Denies appetite disturbance.  Patient reports that energy and motivation have been good.  Patient denies any difficulty with concentration.  Patient denies any suicidal ideation.  Overall not really depressed and anxious otherwise.  Generally satisfied with meds.  Worry about age discrimination  In a new job.    Never filled Ativan Rx and has questions about drug screen.  Or anything else that might help anxiety without showing on drug screen.  Some concerns over going to a new environment and driving to [place].  Past Psychiatric Medication Trials:  Fluvoxamine, venlafaxine, Wellbutrin 300, paroxetine 40 wt gain.  Review of Systems:  Review of Systems  Neurological: Negative for tremors and weakness.  Psychiatric/Behavioral: Negative for agitation, behavioral problems, confusion, decreased concentration, dysphoric mood, hallucinations, self-injury, sleep disturbance and suicidal ideas. The patient is nervous/anxious. The patient is not hyperactive.     Medications: I have reviewed the patient's current medications.  Current Outpatient Medications  Medication Sig  Dispense Refill  . amLODipine (NORVASC) 10 MG tablet Take 10 mg by mouth daily.    Marland Kitchen aspirin 81 MG chewable tablet Chew 1 tablet (81 mg total) by mouth daily.    Marland Kitchen buPROPion (WELLBUTRIN XL) 300 MG 24 hr tablet Take 1 tablet (300 mg total) by mouth daily. 90 tablet 1  . Calcium Carbonate-Vitamin D3 600-400 MG-UNIT TABS Take by mouth 2 (two) times daily.    . Cholecalciferol (VITAMIN D) 50 MCG (2000 UT) tablet Take 2,000 Units by mouth daily.    . fluvoxaMINE (LUVOX) 100 MG tablet Take 1 tablet (100 mg total) by mouth 2 (two) times daily. (Patient taking differently: Take 100 mg by mouth daily. ) 180 tablet 1  . glucose blood test strip 1 each by Other route as needed for other. Use as instructed    . Insulin Human (INSULIN PUMP) SOLN Inject into the skin. Husband has no idea what kind of insulin she takes. Husband states she is on a insulin pump. Pharmacy has a record of Novolog 60 units twice daily.    Marland Kitchen levothyroxine (SYNTHROID, LEVOTHROID) 137 MCG tablet Take 137 mcg by mouth daily before breakfast.    . Multiple Vitamins-Minerals (MULTIVITAMIN ADULTS 50+ PO) Take by mouth.    . quinapril (ACCUPRIL) 20 MG tablet Take 20 mg by mouth 2 (two) times daily.     Marland Kitchen spironolactone (ALDACTONE) 25 MG tablet Take 25 mg by mouth daily.     No current facility-administered medications for this visit.     Medication Side Effects: None  Allergies:  Allergies  Allergen Reactions  . Latex  Other (See Comments)  . Sulfa Antibiotics Hives and Itching    Past Medical History:  Diagnosis Date  . Diabetes mellitus without complication ([place])   . Hypertension   . Thyroid disease     Family History  Problem Relation Age of Onset  . Hypertension Mother   . Hyperlipidemia Mother   . Dementia Mother   . Alzheimer's disease Father     Social History   Socioeconomic History  . Marital status: Married    Spouse name: Not on file  . Number of children: Not on file  . Years of education: Not on file   . Highest education level: Not on file  Occupational History  . Not on file  Social Needs  . Financial resource strain: Not on file  . Food insecurity:    Worry: Not on file    Inability: Not on file  . Transportation needs:    Medical: Not on file    Non-medical: Not on file  Tobacco Use  . Smoking status: Never Smoker  . Smokeless tobacco: Never Used  Substance and Sexual Activity  . Alcohol use: No  . Drug use: No  . Sexual activity: Not on file  Lifestyle  . Physical activity:    Days per week: Not on file    Minutes per session: Not on file  . Stress: Not on file  Relationships  . Social connections:    Talks on phone: Not on file    Gets together: Not on file    Attends religious service: Not on file    Active member of club or organization: Not on file    Attends meetings of clubs or organizations: Not on file    Relationship status: Not on file  . Intimate partner violence:    Fear of current or ex partner: Not on file    Emotionally abused: Not on file    Physically abused: Not on file    Forced sexual activity: Not on file  Other Topics Concern  . Not on file  Social History Narrative  . Not on file    Past Medical History, Surgical history, Social history, and Family history were reviewed and updated as appropriate.   Please see review of systems for further details on the patient's review from today.   Objective:   Physical Exam:  There were no vitals taken for this visit.  Physical Exam Constitutional:      General: She is not in acute distress.    Appearance: She is well-developed.  Musculoskeletal:        General: No deformity.  Neurological:     Mental Status: She is alert and oriented to person, place, and time.     Motor: No tremor.     Coordination: Coordination normal.     Gait: Gait normal.  Psychiatric:        Attention and Perception: She does not perceive auditory hallucinations.        Mood and Affect: Mood is anxious. Mood is  not depressed. Affect is not labile, blunt, angry or inappropriate.        Speech: Speech normal.        Behavior: Behavior normal.        Thought Content: Thought content normal. Thought content does not include homicidal or suicidal ideation. Thought content does not include homicidal or suicidal plan.        Cognition and Memory: Cognition normal.        Judgment:  Judgment normal.     Comments: Insight intact. No auditory or visual hallucinations.       Lab Review:     Component Value Date/Time   NA 141 06/06/2016 0416   K 3.7 06/06/2016 0416   CL 104 06/06/2016 0416   CO2 26 06/06/2016 0416   GLUCOSE 240 (H) 06/06/2016 0416   BUN 9 06/06/2016 0416   CREATININE 0.86 06/06/2016 0416   CALCIUM 8.4 (L) 06/06/2016 0416   PROT 5.8 (L) 06/05/2016 0618   ALBUMIN 3.2 (L) 06/05/2016 0618   AST 142 (H) 06/05/2016 0618   ALT 207 (H) 06/05/2016 0618   ALKPHOS 90 06/05/2016 0618   BILITOT 2.4 (H) 06/05/2016 0618   GFRNONAA >60 06/06/2016 0416   GFRAA >60 06/06/2016 0416       Component Value Date/Time   WBC 8.3 06/06/2016 0416   RBC 3.53 (L) 06/06/2016 0416   HGB 11.4 (L) 06/06/2016 0416   HCT 33.0 (L) 06/06/2016 0416   PLT 163 06/06/2016 0416   MCV 93.5 06/06/2016 0416   MCH 32.3 06/06/2016 0416   MCHC 34.5 06/06/2016 0416   RDW 13.5 06/06/2016 0416   LYMPHSABS 1.7 06/04/2016 0448   MONOABS 0.6 06/04/2016 0448   EOSABS 0.0 06/04/2016 0448   BASOSABS 0.0 06/04/2016 0448    No results found for: POCLITH, LITHIUM   No results found for: PHENYTOIN, PHENOBARB, VALPROATE, CBMZ   .res Assessment: Plan:    Major depressive disorder, recurrent episode, moderate ([place])  Mixed obsessional thoughts and acts  Claustrophobia  Overall Julia Holt is doing well with the exception of the stress of the pending job change.  Her last episode of depression occurred in July and we increase the Wellbutrin to the current dose and it resolved.  She is tolerating the medications well Supportive  therapy re job change and "fear of the unknown" and problem solving for the next steps.  Disc anxiety management.  Will try low dosage propranolol bc won't cause drowsiness driving nor show up on her drug screen.  She prefers this over lorazepam.  Considered hydroxyzine but that might cause more sleepiness.  We will give just a low-dose 20 mg twice a day as needed for anxiety.  Discussed the possibility it could mask symptoms of hypoglycemia but at this tiny dose that should be not a problem.  Satisfied with the medications.  Follow-up 4-6 months.  Call if need to be seen sooner.  Lynder Parents MD, DFAPA   Please see After Visit Summary for patient specific instructions.  No future appointments.  No orders of the defined types were placed in this encounter.     -------------------------------

## 2018-09-10 ENCOUNTER — Telehealth: Payer: Self-pay | Admitting: Psychiatry

## 2018-09-10 NOTE — Telephone Encounter (Signed)
Julia Holt saw you last week and you started propanolol which is doing ok.  However, it says she should not be taking this medication with Wellbutrin because it can cause a positive drug screen.  She gets drug tested at times at work so she wants to discontinue the Wellbutrin and continue with fluoxetine.  Please call to discuss

## 2018-09-10 NOTE — Telephone Encounter (Signed)
This concern about the positive drug screen is easily managed with a note from May if necessary.  I would suggest she not change her medication if she is benefiting from it.  Nothing to worry about with the drug screen.  I can handle any questions that come up about it.

## 2018-09-11 NOTE — Telephone Encounter (Signed)
Left voicemail to call back to get information on what Dr. Clovis Pu recommends

## 2018-09-11 NOTE — Telephone Encounter (Signed)
Pt is not wanting to even go through the hassle with her workplace and not include her personal info with them at this time. She wants to stay on the propranolol and fluoxetine, but go off the Wellbutrin. She just wants those two for now.

## 2018-09-11 NOTE — Telephone Encounter (Signed)
Note the patient is on fluvoxamine not fluoxetine.  She wants to discontinue Wellbutrin.  She can abruptly stop the Wellbutrin per her request.  There is a risk of increased depression if she experiences that let us know.  She may have a week or so of a little more fatigue than usual but that should resolve and should not be severe.  If it is let us know.

## 2018-09-12 NOTE — Telephone Encounter (Signed)
Left detailed message of Dr. Casimiro Needle response. Instructed to call back with any questions or concerns.

## 2018-09-30 ENCOUNTER — Other Ambulatory Visit: Payer: Self-pay | Admitting: Psychiatry

## 2018-11-05 ENCOUNTER — Other Ambulatory Visit: Payer: Self-pay | Admitting: Psychiatry

## 2018-12-07 ENCOUNTER — Other Ambulatory Visit: Payer: Self-pay

## 2018-12-07 ENCOUNTER — Telehealth: Payer: Self-pay | Admitting: Psychiatry

## 2018-12-07 MED ORDER — FLUVOXAMINE MALEATE 100 MG PO TABS: 100.0000 mg | ORAL_TABLET | Freq: Two times a day (BID) | ORAL | 1 refills | Status: DC

## 2018-12-07 NOTE — Telephone Encounter (Signed)
It looks like from the chart that she has been taking just 100 mg 1 daily of fluvoxamine for a while.  I usually go from 100-1 50 before going to 200 mg.  However if she feels like she can tolerate going straight to 200 mg without excessive sleepiness then do so.  If she is somewhat sleepy then take them both at night.

## 2018-12-07 NOTE — Telephone Encounter (Signed)
Left voicemail to call back with symptoms and why requesting an increase.

## 2018-12-07 NOTE — Telephone Encounter (Signed)
Left detailed instructions and to call back with any questions or concerns. Will submit updated rx to her pharmacy.

## 2018-12-07 NOTE — Telephone Encounter (Signed)
Patient would like to find out if you can go up to 2 a day for Luvoxamine having some issues

## 2019-01-14 ENCOUNTER — Telehealth: Payer: Self-pay | Admitting: Psychiatry

## 2019-01-14 NOTE — Telephone Encounter (Signed)
Noted  

## 2019-01-14 NOTE — Telephone Encounter (Signed)
Pt wanted you to know that her mother passed away 22-Jan-2023 at the nursing home. She passed in her sleep.

## 2019-03-06 ENCOUNTER — Other Ambulatory Visit: Payer: Self-pay

## 2019-03-06 ENCOUNTER — Ambulatory Visit (INDEPENDENT_AMBULATORY_CARE_PROVIDER_SITE_OTHER): Payer: BC Managed Care – PPO | Admitting: Psychiatry

## 2019-03-06 ENCOUNTER — Encounter: Payer: Self-pay | Admitting: Psychiatry

## 2019-03-06 DIAGNOSIS — F40240 Claustrophobia: Secondary | ICD-10-CM

## 2019-03-06 DIAGNOSIS — F331 Major depressive disorder, recurrent, moderate: Secondary | ICD-10-CM | POA: Diagnosis not present

## 2019-03-06 DIAGNOSIS — F422 Mixed obsessional thoughts and acts: Secondary | ICD-10-CM

## 2019-03-06 MED ORDER — FLUVOXAMINE MALEATE 100 MG PO TABS: 100.0000 mg | ORAL_TABLET | Freq: Two times a day (BID) | ORAL | 0 refills | Status: DC

## 2019-03-06 MED ORDER — PROPRANOLOL HCL 20 MG PO TABS: 20.0000 mg | ORAL_TABLET | Freq: Two times a day (BID) | ORAL | 3 refills | Status: DC | PRN

## 2019-03-06 NOTE — Progress Notes (Signed)
AZHAR BOHACH LZ:[id number] [birth year] 58 y.o.  Subjective:   Patient ID:  Julia Holt is a 58 y.o. (DOB [birth year]) female.  Chief Complaint:  Chief Complaint  Patient presents with  . Follow-up     Medication Management  . Depression     Medication Management  . Anxiety    Increased    Depression        Associated symptoms include no decreased concentration and no suicidal ideas.  Past medical history includes anxiety.   Anxiety Symptoms include nervous/anxious behavior. Patient reports no confusion, decreased concentration or suicidal ideas.     Julia Holt presents to the office today for follow-up of psychiatric diagnoses.  Last seen March 2020.  Since then she elected to stop Wellbutrin out of concern about potential interaction with propranolol and drug screens at work.  Her mother died 2019/02/03 she had dementia.  At the end she did not recognize her daughter.  It's been really hard.  Misses her.  She called in June asking to increase fluvoxamine to 150 or 200 mg daily.  Started travelling to [place] for work in July and so stressed by the commute.  Exhausted by the end of the day.  Prayed a lot about it.  Feels God told her to resign.  Leaving October 2.  Can't handle the drive anymore.  Anxious driving.  Feels good about the decision.  [name] has driven her some of the time.  Going into job search at [employer].    Taking the propranolol more DT anxiety over work and mother's death.  It's helpful.   The increase in the Luvox did help the anxiety and she's tolerating it.    Patient reports grief issues but not depression.    Patient denies difficulty with sleep initiation or maintenance, but some negative effects temporarily related to grief and stress work.  I get enough sleep.  Denies appetite disturbance.  Patient reports that energy and motivation have been good.  Patient denies any difficulty with concentration.  Patient denies any suicidal  ideation.  Past Psychiatric Medication Trials:  Fluvoxamine, venlafaxine, Wellbutrin 300 helped, paroxetine 40 wt gain, propranolol  Her last episode of depression occurred in July 2019 and we increase the Wellbutrin to 300 mg and it resolved  Review of Systems:  Review of Systems  Neurological: Negative for tremors and weakness.  Psychiatric/Behavioral: Positive for depression. Negative for agitation, behavioral problems, confusion, decreased concentration, dysphoric mood, hallucinations, self-injury, sleep disturbance and suicidal ideas. The patient is nervous/anxious. The patient is not hyperactive.     Medications: I have reviewed the patient's current medications.  Current Outpatient Medications  Medication Sig Dispense Refill  . amLODipine (NORVASC) 10 MG tablet Take 10 mg by mouth daily.    Marland Kitchen aspirin 81 MG chewable tablet Chew 1 tablet (81 mg total) by mouth daily.    . Calcium Carbonate-Vitamin D3 600-400 MG-UNIT TABS Take by mouth 2 (two) times daily.    . Cholecalciferol (VITAMIN D) 50 MCG (2000 UT) tablet Take 2,000 Units by mouth daily.    . fluvoxaMINE (LUVOX) 100 MG tablet Take 1 tablet (100 mg total) by mouth 2 (two) times daily. 180 tablet 1  . glucose blood test strip 1 each by Other route as needed for other. Use as instructed    . Insulin Human (INSULIN PUMP) SOLN Inject into the skin. Husband has no idea what kind of insulin she takes. Husband states she is on a insulin  pump. Pharmacy has a record of Novolog 60 units twice daily.    Marland Kitchen levothyroxine (SYNTHROID, LEVOTHROID) 137 MCG tablet Take 137 mcg by mouth daily before breakfast.    . Multiple Vitamins-Minerals (MULTIVITAMIN ADULTS 50+ PO) Take by mouth.    . propranolol (INDERAL) 20 MG tablet TAKE 1 TABLET (20 MG TOTAL) BY MOUTH 2 (TWO) TIMES DAILY AS NEEDED (ANXIETY). 60 tablet 5  . quinapril (ACCUPRIL) 20 MG tablet Take 20 mg by mouth 2 (two) times daily.     Marland Kitchen spironolactone (ALDACTONE) 25 MG tablet Take 25 mg by  mouth daily.     No current facility-administered medications for this visit.     Medication Side Effects: None  Allergies:  Allergies  Allergen Reactions  . Latex Other (See Comments)  . Sulfa Antibiotics Hives and Itching    Past Medical History:  Diagnosis Date  . Diabetes mellitus without complication ([place])   . Hypertension   . Thyroid disease     Family History  Problem Relation Age of Onset  . Hypertension Mother   . Hyperlipidemia Mother   . Dementia Mother   . Alzheimer's disease Father     Social History   Socioeconomic History  . Marital status: Married    Spouse name: Not on file  . Number of children: Not on file  . Years of education: Not on file  . Highest education level: Not on file  Occupational History  . Not on file  Social Needs  . Financial resource strain: Not on file  . Food insecurity    Worry: Not on file    Inability: Not on file  . Transportation needs    Medical: Not on file    Non-medical: Not on file  Tobacco Use  . Smoking status: Never Smoker  . Smokeless tobacco: Never Used  Substance and Sexual Activity  . Alcohol use: No  . Drug use: No  . Sexual activity: Not on file  Lifestyle  . Physical activity    Days per week: Not on file    Minutes per session: Not on file  . Stress: Not on file  Relationships  . Social Herbalist on phone: Not on file    Gets together: Not on file    Attends religious service: Not on file    Active member of club or organization: Not on file    Attends meetings of clubs or organizations: Not on file    Relationship status: Not on file  . Intimate partner violence    Fear of current or ex partner: Not on file    Emotionally abused: Not on file    Physically abused: Not on file    Forced sexual activity: Not on file  Other Topics Concern  . Not on file  Social History Narrative  . Not on file    Past Medical History, Surgical history, Social history, and Family history  were reviewed and updated as appropriate.   Please see review of systems for further details on the patient's review from today.   Objective:   Physical Exam:  There were no vitals taken for this visit.  Physical Exam Constitutional:      General: She is not in acute distress.    Appearance: She is well-developed.  Musculoskeletal:        General: No deformity.  Neurological:     Mental Status: She is alert and oriented to person, place, and time.  Motor: No tremor.     Coordination: Coordination normal.     Gait: Gait normal.  Psychiatric:        Attention and Perception: She does not perceive auditory hallucinations.        Mood and Affect: Mood is anxious. Mood is not depressed. Affect is not labile, blunt, angry or inappropriate.        Speech: Speech normal.        Behavior: Behavior normal.        Thought Content: Thought content normal. Thought content does not include homicidal or suicidal ideation. Thought content does not include homicidal or suicidal plan.        Cognition and Memory: Cognition normal.        Judgment: Judgment normal.     Comments: Insight intact. No auditory or visual hallucinations.  Anxiety is somewhat worse due to stressors      Lab Review:     Component Value Date/Time   NA 141 06/06/2016 0416   K 3.7 06/06/2016 0416   CL 104 06/06/2016 0416   CO2 26 06/06/2016 0416   GLUCOSE 240 (H) 06/06/2016 0416   BUN 9 06/06/2016 0416   CREATININE 0.86 06/06/2016 0416   CALCIUM 8.4 (L) 06/06/2016 0416   PROT 5.8 (L) 06/05/2016 0618   ALBUMIN 3.2 (L) 06/05/2016 0618   AST 142 (H) 06/05/2016 0618   ALT 207 (H) 06/05/2016 0618   ALKPHOS 90 06/05/2016 0618   BILITOT 2.4 (H) 06/05/2016 0618   GFRNONAA >60 06/06/2016 0416   GFRAA >60 06/06/2016 0416       Component Value Date/Time   WBC 8.3 06/06/2016 0416   RBC 3.53 (L) 06/06/2016 0416   HGB 11.4 (L) 06/06/2016 0416   HCT 33.0 (L) 06/06/2016 0416   PLT 163 06/06/2016 0416   MCV 93.5  06/06/2016 0416   MCH 32.3 06/06/2016 0416   MCHC 34.5 06/06/2016 0416   RDW 13.5 06/06/2016 0416   LYMPHSABS 1.7 06/04/2016 0448   MONOABS 0.6 06/04/2016 0448   EOSABS 0.0 06/04/2016 0448   BASOSABS 0.0 06/04/2016 0448    No results found for: POCLITH, LITHIUM   No results found for: PHENYTOIN, PHENOBARB, VALPROATE, CBMZ   .res Assessment: Plan:    No diagnosis found.  The patient has worsening of anxiety since she was here related to having to resign from a job and the loss of her mother.  The increase in Luvox was somewhat helpful as well as the propranolol.  She is not markedly depressed and did not see major problems from discontinuing the Wellbutrin.  Historically her depressive episodes have typically been triggered.  Discussed discussed the stages of grief.  Some grief work was done related to the death of her mother in 01-22-23.  Because it is been helpful continue propranolol low-dose 20 mg twice a day as needed for anxiety.  Discussed the possibility it could mask symptoms of hypoglycemia but at this tiny dose that should be not a problem.  Satisfied with the medications. Continue fluvoxamine 200 mg daily.  Disc SE in detail and SSRI withdrawal sx.  Follow-up 4-6 months.  Call if need to be seen sooner.  Lynder Parents MD, DFAPA   Please see After Visit Summary for patient specific instructions.  No future appointments.  No orders of the defined types were placed in this encounter.     -------------------------------

## 2019-03-07 ENCOUNTER — Telehealth: Payer: Self-pay | Admitting: Psychiatry

## 2019-03-07 NOTE — Telephone Encounter (Signed)
Seen yesterday but forgot to mention below: She is having to resign her job due to the long commute, stress,and health issues. She mentioned an episode on 9/11 with her insulin being too low and had her insulin pump warn her while [place]. This caused her more stress. She was wondering if you would write a letter stating these things to see if it would help her get unemployment benefits.

## 2019-06-28 ENCOUNTER — Other Ambulatory Visit: Payer: Self-pay | Admitting: Psychiatry

## 2019-07-02 ENCOUNTER — Ambulatory Visit (INDEPENDENT_AMBULATORY_CARE_PROVIDER_SITE_OTHER): Payer: BC Managed Care – PPO | Admitting: Psychiatry

## 2019-07-02 ENCOUNTER — Encounter: Payer: Self-pay | Admitting: Psychiatry

## 2019-07-02 ENCOUNTER — Other Ambulatory Visit: Payer: Self-pay

## 2019-07-02 DIAGNOSIS — F422 Mixed obsessional thoughts and acts: Secondary | ICD-10-CM | POA: Diagnosis not present

## 2019-07-02 DIAGNOSIS — F331 Major depressive disorder, recurrent, moderate: Secondary | ICD-10-CM | POA: Diagnosis not present

## 2019-07-02 DIAGNOSIS — F40240 Claustrophobia: Secondary | ICD-10-CM | POA: Diagnosis not present

## 2019-07-02 LAB — HM MAMMOGRAPHY

## 2019-07-02 NOTE — Progress Notes (Signed)
Julia Holt [id number] December 13, 1960 59 y.o.  Subjective:   Patient ID:  Julia Holt is a 59 y.o. (DOB [birth year]) female.  Chief Complaint:  Chief Complaint  Patient presents with  . Follow-up     Medication Management  . Depression     Medication Management    Depression        Associated symptoms include no decreased concentration and no suicidal ideas.  Past medical history includes anxiety.   Anxiety Symptoms include nervous/anxious behavior. Patient reports no confusion, decreased concentration or suicidal ideas.     Julia Holt presents to the office today for follow-up of psychiatric diagnoses.  seen March 2020.  Since then she elected to stop Wellbutrin out of concern about potential interaction with propranolol and drug screens at work.  Her mother died 02-04-2019 she had dementia.  At the end she did not recognize her daughter.  It's been really hard.  Misses her.  She called in June asking to increase fluvoxamine to 150 or 200 mg daily.  Started travelling to [place] for work in July and so stressed by the commute.  Exhausted by the end of the day.  Prayed a lot about it.  Feels God told her to resign.  Leaving October 2.  Can't handle the drive anymore.  Anxious driving.  Feels good about the decision.  [name] has driven her some of the time.  Going into job search at [employer].    Last seen March 06, 2019.  She was still dealing with grief over the loss of her mother.  We made no med change.  She continued fluvoxamine 200 mg daily as well as propranolol 20 mg twice daily as needed anxiety.  Doing OK.  Still looking for job.  Using employment agency.  Hopeful.  Had a temp job.    Taking the propranolol mainly just once daily.  Still struggling over mother's death with holidays.  Ashes in urn with her dad.  All the political and social unrest watching CNN is depressing.    Patient reports grief issues but not depression.    Patient denies  difficulty with sleep initiation or maintenance, but some negative effects temporarily related to grief and stress work.  I get enough sleep.  Denies appetite disturbance.  Patient reports that energy and motivation have been good.  Patient denies any difficulty with concentration.  Patient denies any suicidal ideation.  Past Psychiatric Medication Trials:  Fluvoxamine, venlafaxine, Wellbutrin 300 helped, paroxetine 40 wt gain, propranolol  Her last episode of depression occurred in July 2019 and we increase the Wellbutrin to 300 mg and it resolved  Review of Systems:  Review of Systems  Neurological: Negative for tremors and weakness.  Psychiatric/Behavioral: Positive for depression. Negative for agitation, behavioral problems, confusion, decreased concentration, dysphoric mood, hallucinations, self-injury, sleep disturbance and suicidal ideas. The patient is nervous/anxious. The patient is not hyperactive.     Medications: I have reviewed the patient's current medications.  Current Outpatient Medications  Medication Sig Dispense Refill  . amLODipine (NORVASC) 10 MG tablet Take 10 mg by mouth daily.    Marland Kitchen aspirin 81 MG chewable tablet Chew 1 tablet (81 mg total) by mouth daily.    . Calcium Carbonate-Vitamin D3 600-400 MG-UNIT TABS Take by mouth 2 (two) times daily.    . Cholecalciferol (VITAMIN D) 50 MCG (2000 UT) tablet Take 2,000 Units by mouth daily.    . fluvoxaMINE (LUVOX) 100 MG tablet Take 1 tablet (  100 mg total) by mouth 2 (two) times daily. 180 tablet 0  . glucose blood test strip 1 each by Other route as needed for other. Use as instructed    . Insulin Human (INSULIN PUMP) SOLN Inject into the skin. Husband has no idea what kind of insulin she takes. Husband states she is on a insulin pump. Pharmacy has a record of Novolog 60 units twice daily.    Marland Kitchen levothyroxine (SYNTHROID, LEVOTHROID) 137 MCG tablet Take 137 mcg by mouth daily before breakfast.    . Multiple Vitamins-Minerals  (MULTIVITAMIN ADULTS 50+ PO) Take by mouth.    . propranolol (INDERAL) 20 MG tablet TAKE 1 TABLET (20 MG TOTAL) BY MOUTH 2 (TWO) TIMES DAILY AS NEEDED (ANXIETY). 180 tablet 0  . quinapril (ACCUPRIL) 20 MG tablet Take 20 mg by mouth 2 (two) times daily.     Marland Kitchen spironolactone (ALDACTONE) 25 MG tablet Take 25 mg by mouth daily.     No current facility-administered medications for this visit.    Medication Side Effects: None  Allergies:  Allergies  Allergen Reactions  . Latex Other (See Comments)  . Sulfa Antibiotics Hives and Itching    Past Medical History:  Diagnosis Date  . Diabetes mellitus without complication ([place])   . Hypertension   . Thyroid disease     Family History  Problem Relation Age of Onset  . Hypertension Mother   . Hyperlipidemia Mother   . Dementia Mother   . Alzheimer's disease Father     Social History   Socioeconomic History  . Marital status: Married    Spouse name: Not on file  . Number of children: Not on file  . Years of education: Not on file  . Highest education level: Not on file  Occupational History  . Not on file  Tobacco Use  . Smoking status: Never Smoker  . Smokeless tobacco: Never Used  Substance and Sexual Activity  . Alcohol use: No  . Drug use: No  . Sexual activity: Not on file  Other Topics Concern  . Not on file  Social History Narrative  . Not on file   Social Determinants of Health   Financial Resource Strain:   . Difficulty of Paying Living Expenses: Not on file  Food Insecurity:   . Worried About Charity fundraiser in the Last Year: Not on file  . Ran Out of Food in the Last Year: Not on file  Transportation Needs:   . Lack of Transportation (Medical): Not on file  . Lack of Transportation (Non-Medical): Not on file  Physical Activity:   . Days of Exercise per Week: Not on file  . Minutes of Exercise per Session: Not on file  Stress:   . Feeling of Stress : Not on file  Social Connections:   . Frequency  of Communication with Friends and Family: Not on file  . Frequency of Social Gatherings with Friends and Family: Not on file  . Attends Religious Services: Not on file  . Active Member of Clubs or Organizations: Not on file  . Attends Archivist Meetings: Not on file  . Marital Status: Not on file  Intimate Partner Violence:   . Fear of Current or Ex-Partner: Not on file  . Emotionally Abused: Not on file  . Physically Abused: Not on file  . Sexually Abused: Not on file    Past Medical History, Surgical history, Social history, and Family history were reviewed and updated as appropriate.  Please see review of systems for further details on the patient's review from today.   Objective:   Physical Exam:  There were no vitals taken for this visit.  Physical Exam Constitutional:      General: She is not in acute distress.    Appearance: She is well-developed.  Musculoskeletal:        General: No deformity.  Neurological:     Mental Status: She is alert and oriented to person, place, and time.     Motor: No tremor.     Coordination: Coordination normal.     Gait: Gait normal.  Psychiatric:        Attention and Perception: She does not perceive auditory hallucinations.        Mood and Affect: Mood is anxious. Mood is not depressed. Affect is tearful. Affect is not labile, blunt, angry or inappropriate.        Speech: Speech normal.        Behavior: Behavior normal.        Thought Content: Thought content normal. Thought content does not include homicidal or suicidal ideation. Thought content does not include homicidal or suicidal plan.        Cognition and Memory: Cognition normal.        Judgment: Judgment normal.     Comments: Insight intact. No auditory or visual hallucinations.  Anxiety is somewhat worse due to stressors      Lab Review:     Component Value Date/Time   NA 141 06/06/2016 0416   K 3.7 06/06/2016 0416   CL 104 06/06/2016 0416   CO2 26  06/06/2016 0416   GLUCOSE 240 (H) 06/06/2016 0416   BUN 9 06/06/2016 0416   CREATININE 0.86 06/06/2016 0416   CALCIUM 8.4 (L) 06/06/2016 0416   PROT 5.8 (L) 06/05/2016 0618   ALBUMIN 3.2 (L) 06/05/2016 0618   AST 142 (H) 06/05/2016 0618   ALT 207 (H) 06/05/2016 0618   ALKPHOS 90 06/05/2016 0618   BILITOT 2.4 (H) 06/05/2016 0618   GFRNONAA >60 06/06/2016 0416   GFRAA >60 06/06/2016 0416       Component Value Date/Time   WBC 8.3 06/06/2016 0416   RBC 3.53 (L) 06/06/2016 0416   HGB 11.4 (L) 06/06/2016 0416   HCT 33.0 (L) 06/06/2016 0416   PLT 163 06/06/2016 0416   MCV 93.5 06/06/2016 0416   MCH 32.3 06/06/2016 0416   MCHC 34.5 06/06/2016 0416   RDW 13.5 06/06/2016 0416   LYMPHSABS 1.7 06/04/2016 0448   MONOABS 0.6 06/04/2016 0448   EOSABS 0.0 06/04/2016 0448   BASOSABS 0.0 06/04/2016 0448    No results found for: POCLITH, LITHIUM   No results found for: PHENYTOIN, PHENOBARB, VALPROATE, CBMZ   .res Assessment: Plan:    Major depressive disorder, recurrent episode, moderate ([place])  Mixed obsessional thoughts and acts  Claustrophobia  The patient has worsening of anxiety since she was here related to having to resign from a job and the loss of her mother.  The increase in Luvox was somewhat helpful as well as the propranolol.  She is not markedly depressed and did not see major problems from discontinuing the Wellbutrin.  Historically her depressive episodes have typically been triggered.  Discussed discussed the stages of grief.  Some grief work was done related to the death of her mother in 26-Jan-2023.  Because it is been helpful continue propranolol low-dose 20 mg twice a day as needed for anxiety.  Discussed the possibility it  could mask symptoms of hypoglycemia but at this tiny dose that should be not a problem.  Satisfied with the medications. Continue fluvoxamine 200 mg daily.  Disc SE in detail and SSRI withdrawal sx.  Follow-up 4-6 months.  Call if need to be seen  sooner.  Lynder Parents MD, DFAPA   Please see After Visit Summary for patient specific instructions.  No future appointments.  No orders of the defined types were placed in this encounter.     -------------------------------

## 2019-09-27 ENCOUNTER — Other Ambulatory Visit: Payer: Self-pay | Admitting: Psychiatry

## 2019-09-27 DIAGNOSIS — F331 Major depressive disorder, recurrent, moderate: Secondary | ICD-10-CM

## 2019-09-27 DIAGNOSIS — F422 Mixed obsessional thoughts and acts: Secondary | ICD-10-CM

## 2019-10-11 ENCOUNTER — Other Ambulatory Visit (HOSPITAL_BASED_OUTPATIENT_CLINIC_OR_DEPARTMENT_OTHER): Payer: Self-pay | Admitting: Psychiatry

## 2019-10-30 ENCOUNTER — Other Ambulatory Visit: Payer: Self-pay

## 2019-10-30 ENCOUNTER — Encounter: Payer: Self-pay | Admitting: Psychiatry

## 2019-10-30 ENCOUNTER — Ambulatory Visit (INDEPENDENT_AMBULATORY_CARE_PROVIDER_SITE_OTHER): Payer: BC Managed Care – PPO | Admitting: Psychiatry

## 2019-10-30 DIAGNOSIS — F40240 Claustrophobia: Secondary | ICD-10-CM | POA: Diagnosis not present

## 2019-10-30 DIAGNOSIS — F331 Major depressive disorder, recurrent, moderate: Secondary | ICD-10-CM

## 2019-10-30 DIAGNOSIS — F422 Mixed obsessional thoughts and acts: Secondary | ICD-10-CM

## 2019-10-30 NOTE — Progress Notes (Signed)
MILLENNIA DABU LZ:[id number] 1961/03/22 59 y.o.  Subjective:   Patient ID:  Julia Holt is a 59 y.o. (DOB Dec 30, 1960) female.  Chief Complaint:  Chief Complaint  Patient presents with  . Follow-up    Mood and anxiety    Depression        Associated symptoms include no decreased concentration and no suicidal ideas.  Past medical history includes anxiety.   Anxiety Symptoms include nervous/anxious behavior. Patient reports no confusion, decreased concentration, dizziness or suicidal ideas.     [name] presents to the office today for follow-up of psychiatric diagnoses.  seen March 2020.  Since then she elected to stop Wellbutrin out of concern about potential interaction with propranolol and drug screens at work.  Her mother died 01-13-2019 she had dementia.  At the end she did not recognize her daughter.  It's been really hard.  Misses her.  She called in June asking to increase fluvoxamine to 150 or 200 mg daily.  Started travelling to [place] for work in 01-13-23 and so stressed by the commute.  Exhausted by the end of the day.  Prayed a lot about it.  Feels God told her to resign.  Leaving October 2.  Can't handle the drive anymore.  Anxious driving.  Feels good about the decision.  [name] has driven her some of the time.  Going into job search at [employer].    seen March 06, 2019.  She was still dealing with grief over the loss of her mother.  We made no med change.  She continued fluvoxamine 200 mg daily as well as propranolol 20 mg twice daily as needed anxiety.  Last seen January 2021.  No med changes.    10/30/19  Appt with the following noted: No job yet.  Still looking, but expects to get hired today.  Got through mother's day OK.  Managing OK.    Taking the propranolol mainly just once daily.  Still struggling over mother's death with holidays.  Ashes in urn with her dad.  All the political and social unrest watching CNN is depressing.    Patient  reports grief issues but not depression.    Some anxierty and taking the propranolol daily but it helps.  Overall handling things OK.  Patient denies difficulty with sleep initiation or maintenance, but some negative effects temporarily related to grief and stress work.  I get enough sleep.  Denies appetite disturbance.  Patient reports that energy and motivation have been good.  Patient denies any difficulty with concentration.  Patient denies any suicidal ideation.  Past Psychiatric Medication Trials:  Fluvoxamine, venlafaxine, Wellbutrin 300 helped, paroxetine 40 wt gain, propranolol  Her last episode of depression occurred in Jan 12, 2018 and we increase the Wellbutrin to 300 mg and it resolved  Review of Systems:  Review of Systems  Neurological: Negative for dizziness, tremors and weakness.  Psychiatric/Behavioral: Positive for depression. Negative for agitation, behavioral problems, confusion, decreased concentration, dysphoric mood, hallucinations, self-injury, sleep disturbance and suicidal ideas. The patient is nervous/anxious. The patient is not hyperactive.     Medications: I have reviewed the patient's current medications.  Current Outpatient Medications  Medication Sig Dispense Refill  . amLODipine (NORVASC) 10 MG tablet Take 10 mg by mouth daily.    Marland Kitchen aspirin 81 MG chewable tablet Chew 1 tablet (81 mg total) by mouth daily.    . Calcium Carbonate-Vitamin D3 600-400 MG-UNIT TABS Take by mouth 2 (two) times daily.    Marland Kitchen  Cholecalciferol (VITAMIN D) 50 MCG (2000 UT) tablet Take 2,000 Units by mouth daily.    . fluvoxaMINE (LUVOX) 100 MG tablet TAKE 1 TABLET BY MOUTH TWICE A DAY (Patient taking differently: Take 200 mg by mouth at bedtime. ) 180 tablet 1  . glucose blood test strip 1 each by Other route as needed for other. Use as instructed    . Insulin Human (INSULIN PUMP) SOLN Inject into the skin. Husband has no idea what kind of insulin she takes. Husband states she is on a insulin  pump. Pharmacy has a record of Novolog 60 units twice daily.    Marland Kitchen levothyroxine (SYNTHROID, LEVOTHROID) 137 MCG tablet Take 137 mcg by mouth daily before breakfast.    . Multiple Vitamins-Minerals (MULTIVITAMIN ADULTS 50+ PO) Take by mouth.    . propranolol (INDERAL) 20 MG tablet TAKE 1 TABLET (20 MG TOTAL) BY MOUTH 2 (TWO) TIMES DAILY AS NEEDED (ANXIETY). 180 tablet 0  . quinapril (ACCUPRIL) 20 MG tablet Take 20 mg by mouth 2 (two) times daily.     Marland Kitchen spironolactone (ALDACTONE) 25 MG tablet Take 25 mg by mouth daily.     No current facility-administered medications for this visit.    Medication Side Effects: None  Allergies:  Allergies  Allergen Reactions  . Latex Other (See Comments)  . Sulfa Antibiotics Hives and Itching    Past Medical History:  Diagnosis Date  . Diabetes mellitus without complication ([place])   . Hypertension   . Thyroid disease     Family History  Problem Relation Age of Onset  . Hypertension Mother   . Hyperlipidemia Mother   . Dementia Mother   . Alzheimer's disease Father     Social History   Socioeconomic History  . Marital status: Married    Spouse name: Not on file  . Number of children: Not on file  . Years of education: Not on file  . Highest education level: Not on file  Occupational History  . Not on file  Tobacco Use  . Smoking status: Never Smoker  . Smokeless tobacco: Never Used  Substance and Sexual Activity  . Alcohol use: No  . Drug use: No  . Sexual activity: Not on file  Other Topics Concern  . Not on file  Social History Narrative  . Not on file   Social Determinants of Health   Financial Resource Strain:   . Difficulty of Paying Living Expenses:   Food Insecurity:   . Worried About Charity fundraiser in the Last Year:   . Arboriculturist in the Last Year:   Transportation Needs:   . Film/video editor (Medical):   Marland Kitchen Lack of Transportation (Non-Medical):   Physical Activity:   . Days of Exercise per Week:    . Minutes of Exercise per Session:   Stress:   . Feeling of Stress :   Social Connections:   . Frequency of Communication with Friends and Family:   . Frequency of Social Gatherings with Friends and Family:   . Attends Religious Services:   . Active Member of Clubs or Organizations:   . Attends Archivist Meetings:   Marland Kitchen Marital Status:   Intimate Partner Violence:   . Fear of Current or Ex-Partner:   . Emotionally Abused:   Marland Kitchen Physically Abused:   . Sexually Abused:     Past Medical History, Surgical history, Social history, and Family history were reviewed and updated as appropriate.   Please  see review of systems for further details on the patient's review from today.   Objective:   Physical Exam:  There were no vitals taken for this visit.  Physical Exam Constitutional:      General: She is not in acute distress.    Appearance: She is well-developed.  Musculoskeletal:        General: No deformity.  Neurological:     Mental Status: She is alert and oriented to person, place, and time.     Motor: No tremor.     Coordination: Coordination normal.     Gait: Gait normal.  Psychiatric:        Attention and Perception: She does not perceive auditory hallucinations.        Mood and Affect: Mood is anxious. Mood is not depressed. Affect is not labile, blunt, angry, tearful or inappropriate.        Speech: Speech normal.        Behavior: Behavior normal.        Thought Content: Thought content normal. Thought content does not include homicidal or suicidal ideation. Thought content does not include homicidal or suicidal plan.        Cognition and Memory: Cognition normal.        Judgment: Judgment normal.     Comments: Insight intact. No auditory or visual hallucinations.  Anxiety is manageable.      Lab Review:     Component Value Date/Time   NA 141 06/06/2016 0416   K 3.7 06/06/2016 0416   CL 104 06/06/2016 0416   CO2 26 06/06/2016 0416   GLUCOSE 240 (H)  06/06/2016 0416   BUN 9 06/06/2016 0416   CREATININE 0.86 06/06/2016 0416   CALCIUM 8.4 (L) 06/06/2016 0416   PROT 5.8 (L) 06/05/2016 0618   ALBUMIN 3.2 (L) 06/05/2016 0618   AST 142 (H) 06/05/2016 0618   ALT 207 (H) 06/05/2016 0618   ALKPHOS 90 06/05/2016 0618   BILITOT 2.4 (H) 06/05/2016 0618   GFRNONAA >60 06/06/2016 0416   GFRAA >60 06/06/2016 0416       Component Value Date/Time   WBC 8.3 06/06/2016 0416   RBC 3.53 (L) 06/06/2016 0416   HGB 11.4 (L) 06/06/2016 0416   HCT 33.0 (L) 06/06/2016 0416   PLT 163 06/06/2016 0416   MCV 93.5 06/06/2016 0416   MCH 32.3 06/06/2016 0416   MCHC 34.5 06/06/2016 0416   RDW 13.5 06/06/2016 0416   LYMPHSABS 1.7 06/04/2016 0448   MONOABS 0.6 06/04/2016 0448   EOSABS 0.0 06/04/2016 0448   BASOSABS 0.0 06/04/2016 0448    No results found for: POCLITH, LITHIUM   No results found for: PHENYTOIN, PHENOBARB, VALPROATE, CBMZ   .res Assessment: Plan:    Major depressive disorder, recurrent episode, moderate ([place])  Mixed obsessional thoughts and acts  Claustrophobia  The patient has worsening of anxiety since she was here related to having to resign from a job and the loss of her mother.  The increase in Luvox was somewhat helpful as well as the propranolol.  She is not markedly depressed and did not see major problems from discontinuing the Wellbutrin.  Historically her depressive episodes have typically been triggered.  Discussed discussed the stages of grief.  Some grief work was done related to the death of her mother in 01/12/23.  Because it is been helpful continue propranolol low-dose 20 mg twice a day as needed for anxiety.  Discussed the possibility it could mask symptoms of hypoglycemia but at  this tiny dose that should be not a problem.  Satisfied with the medications.  No change indicated. Continue fluvoxamine 200 mg daily.  Disc SE in detail and SSRI withdrawal sx.  Follow-up 4-6 months.  Call if need to be seen  sooner.  Lynder Parents MD, DFAPA   Please see After Visit Summary for patient specific instructions.  Future Appointments  Date Time Provider [place]  12/12/2019  3:30 PM Rankin, Clent Demark, MD RDE-RDE None    No orders of the defined types were placed in this encounter.     -------------------------------

## 2019-12-12 ENCOUNTER — Other Ambulatory Visit: Payer: Self-pay

## 2019-12-12 ENCOUNTER — Encounter (INDEPENDENT_AMBULATORY_CARE_PROVIDER_SITE_OTHER): Payer: Self-pay | Admitting: Ophthalmology

## 2019-12-12 ENCOUNTER — Ambulatory Visit (INDEPENDENT_AMBULATORY_CARE_PROVIDER_SITE_OTHER): Payer: BC Managed Care – PPO | Admitting: Ophthalmology

## 2019-12-12 DIAGNOSIS — E109 Type 1 diabetes mellitus without complications: Secondary | ICD-10-CM

## 2019-12-12 DIAGNOSIS — H2513 Age-related nuclear cataract, bilateral: Secondary | ICD-10-CM | POA: Diagnosis not present

## 2019-12-12 DIAGNOSIS — H43821 Vitreomacular adhesion, right eye: Secondary | ICD-10-CM | POA: Diagnosis not present

## 2019-12-12 DIAGNOSIS — H43822 Vitreomacular adhesion, left eye: Secondary | ICD-10-CM

## 2019-12-12 NOTE — Progress Notes (Signed)
12/12/2019     CHIEF COMPLAINT Patient presents for Diabetic Eye Exam   HISTORY OF PRESENT ILLNESS: Julia Holt is a 59 y.o. female who presents to the clinic today for:   HPI    Diabetic Eye Exam    Vision is stable.  Diabetes characteristics include Type 2.  Blood sugar level is controlled.  Last Blood Glucose 120.  Last A1C 6.  I, the attending physician,  performed the HPI with the patient and updated documentation appropriately.          Comments    1 Year Diabetic Exam OU. OCT  Pt states vision is stable. Pt does have trouble reading small print but states her gls really help.       Last edited by Tilda Franco on 12/12/2019  3:33 PM. (History)      Referring physician: Lilian Coma., MD No address on file  HISTORICAL INFORMATION:   Selected notes from the MEDICAL RECORD NUMBER    Lab Results  Component Value Date   HGBA1C 5.5 06/02/2016     CURRENT MEDICATIONS: No current outpatient medications on file. (Ophthalmic Drugs)   No current facility-administered medications for this visit. (Ophthalmic Drugs)   Current Outpatient Medications (Other)  Medication Sig   amLODipine (NORVASC) 10 MG tablet Take 10 mg by mouth daily.   aspirin 81 MG chewable tablet Chew 1 tablet (81 mg total) by mouth daily.   Calcium Carbonate-Vitamin D3 600-400 MG-UNIT TABS Take by mouth 2 (two) times daily.   Cholecalciferol (VITAMIN D) 50 MCG (2000 UT) tablet Take 2,000 Units by mouth daily.   fluvoxaMINE (LUVOX) 100 MG tablet TAKE 1 TABLET BY MOUTH TWICE A DAY (Patient taking differently: Take 200 mg by mouth at bedtime. )   glucose blood test strip 1 each by Other route as needed for other. Use as instructed   Insulin Human (INSULIN PUMP) SOLN Inject into the skin. Husband has no idea what kind of insulin she takes. Husband states she is on a insulin pump. Pharmacy has a record of Novolog 60 units twice daily.   levothyroxine (SYNTHROID, LEVOTHROID) 137 MCG tablet  Take 137 mcg by mouth daily before breakfast.   Multiple Vitamins-Minerals (MULTIVITAMIN ADULTS 50+ PO) Take by mouth.   propranolol (INDERAL) 20 MG tablet TAKE 1 TABLET (20 MG TOTAL) BY MOUTH 2 (TWO) TIMES DAILY AS NEEDED (ANXIETY).   quinapril (ACCUPRIL) 20 MG tablet Take 20 mg by mouth 2 (two) times daily.    spironolactone (ALDACTONE) 25 MG tablet Take 25 mg by mouth daily.   No current facility-administered medications for this visit. (Other)      REVIEW OF SYSTEMS: ROS    Positive for: Endocrine   Last edited by Tilda Franco on 12/12/2019  3:33 PM. (History)       ALLERGIES Allergies  Allergen Reactions   Latex Other (See Comments)   Sulfa Antibiotics Hives and Itching    PAST MEDICAL HISTORY Past Medical History:  Diagnosis Date   Diabetes mellitus without complication ([place])    Hypertension    Thyroid disease    Past Surgical History:  Procedure Laterality Date   BACK SURGERY     BUNIONECTOMY Left     FAMILY HISTORY Family History  Problem Relation Age of Onset   Hypertension Mother    Hyperlipidemia Mother    Dementia Mother    Alzheimer's disease Father     SOCIAL HISTORY Social History   Tobacco Use  Smoking status: Never Smoker   Smokeless tobacco: Never Used  Substance Use Topics   Alcohol use: No   Drug use: No         OPHTHALMIC EXAM:  Base Eye Exam    Visual Acuity (Snellen - Linear)      Right Left   Dist cc 20/20 20/20   Correction: Glasses       Tonometry (Tonopen, 3:38 PM)      Right Left   Pressure 10 10       Pupils      Pupils Dark Light Shape React APD   Right PERRL 5 4 Round Brisk None   Left PERRL 6 5 Round Brisk None       Visual Fields (Counting fingers)      Left Right    Full Full       Neuro/Psych    Oriented x3: Yes   Mood/Affect: Normal       Dilation    Both eyes: 1.0% Mydriacyl, 2.5% Phenylephrine @ 3:38 PM        Slit Lamp and Fundus Exam    External Exam       Right Left   External Normal Normal       Slit Lamp Exam      Right Left   Lids/Lashes Normal Normal   Conjunctiva/Sclera White and quiet White and quiet   Cornea Clear Clear   Anterior Chamber Deep and quiet Deep and quiet   Iris Round and reactive Round and reactive   Lens 1+ Nuclear sclerosis 1+ Nuclear sclerosis   Anterior Vitreous Normal Normal       Fundus Exam      Right Left   Posterior Vitreous Normal Normal   Disc Normal Normal   C/D Ratio 0.1 0.25   Macula Normal Normal   Vessels no DR no DR   Periphery Normal Normal          IMAGING AND PROCEDURES  Imaging and Procedures for 12/12/19  OCT, Retina - OU - Both Eyes       Right Eye Quality was good. Scan locations included subfoveal. Central Foveal Thickness: 276. Progression has been stable. Findings include vitreomacular adhesion .   Left Eye Quality was good. Scan locations included subfoveal. Central Foveal Thickness: 275. Progression has been stable. Findings include vitreomacular adhesion .                 ASSESSMENT/PLAN:  Diabetes mellitus type 1, controlled, without complications 1 years of diabetes mellitus, still no diabetic retinopathy on clinical examination.  Turn visit in 1 year      ICD-10-CM   1. Vitreomacular adhesion of right eye  H43.821 OCT, Retina - OU - Both Eyes  2. Nuclear sclerotic cataract of both eyes  H25.13   3. Vitreomacular adhesion of left eye  H43.822   4. Diabetes mellitus type 1, controlled, without complications (HCC)  X21.1     1.  2.  3.  Ophthalmic Meds Ordered this visit:  No orders of the defined types were placed in this encounter.      Return in about 1 year (around 12/11/2020) for DILATE OU, OCT.  There are no Patient Instructions on file for this visit.   Explained the diagnoses, plan, and follow up with the patient and they expressed understanding.  Patient expressed understanding of the importance of proper follow up care.   Clent Demark Anahit Klumb M.D. Diseases & Surgery of the Retina and  Vitreous Retina & Diabetic [place] 12/12/19     Abbreviations: M myopia (nearsighted); A astigmatism; H hyperopia (farsighted); P presbyopia; Mrx spectacle prescription;  CTL contact lenses; OD right eye; OS left eye; OU both eyes  XT exotropia; ET esotropia; PEK punctate epithelial keratitis; PEE punctate epithelial erosions; DES dry eye syndrome; MGD meibomian gland dysfunction; ATs artificial tears; PFAT's preservative free artificial tears; [place] nuclear sclerotic cataract; PSC posterior subcapsular cataract; ERM epi-retinal membrane; PVD posterior vitreous detachment; RD retinal detachment; DM diabetes mellitus; DR diabetic retinopathy; NPDR non-proliferative diabetic retinopathy; PDR proliferative diabetic retinopathy; CSME clinically significant macular edema; DME diabetic macular edema; dbh dot blot hemorrhages; CWS cotton wool spot; POAG primary open angle glaucoma; C/D cup-to-disc ratio; HVF humphrey visual field; GVF goldmann visual field; OCT optical coherence tomography; IOP intraocular pressure; BRVO Branch retinal vein occlusion; CRVO central retinal vein occlusion; CRAO central retinal artery occlusion; BRAO branch retinal artery occlusion; RT retinal tear; SB scleral buckle; PPV pars plana vitrectomy; VH Vitreous hemorrhage; PRP panretinal laser photocoagulation; IVK intravitreal kenalog; VMT vitreomacular traction; MH Macular hole;  NVD neovascularization of the disc; NVE neovascularization elsewhere; AREDS age related eye disease study; ARMD age related macular degeneration; POAG primary open angle glaucoma; EBMD epithelial/anterior basement membrane dystrophy; ACIOL anterior chamber intraocular lens; IOL intraocular lens; PCIOL posterior chamber intraocular lens; Phaco/IOL phacoemulsification with intraocular lens placement; [place] photorefractive keratectomy; LASIK laser assisted in situ keratomileusis; HTN hypertension; DM diabetes  mellitus; COPD chronic obstructive pulmonary disease

## 2019-12-12 NOTE — Assessment & Plan Note (Signed)
1 years of diabetes mellitus, still no diabetic retinopathy on clinical examination.  Turn visit in 1 year

## 2019-12-24 ENCOUNTER — Other Ambulatory Visit (HOSPITAL_BASED_OUTPATIENT_CLINIC_OR_DEPARTMENT_OTHER): Payer: Self-pay | Admitting: Endocrinology

## 2020-02-04 ENCOUNTER — Other Ambulatory Visit (HOSPITAL_BASED_OUTPATIENT_CLINIC_OR_DEPARTMENT_OTHER): Payer: Self-pay | Admitting: Endocrinology

## 2020-02-04 MED FILL — HumaLOG 100 UNIT/ML SOLN: 100 | 83 days supply | Qty: 50 | Fill #0

## 2020-02-11 DIAGNOSIS — E109 Type 1 diabetes mellitus without complications: Secondary | ICD-10-CM | POA: Diagnosis not present

## 2020-02-11 DIAGNOSIS — E108 Type 1 diabetes mellitus with unspecified complications: Secondary | ICD-10-CM | POA: Diagnosis not present

## 2020-02-12 MED FILL — FLUVOXAMINE MALEATE 100 MG: 100 | 90 days supply | Qty: 180 | Fill #0

## 2020-02-14 ENCOUNTER — Other Ambulatory Visit (HOSPITAL_BASED_OUTPATIENT_CLINIC_OR_DEPARTMENT_OTHER): Payer: Self-pay | Admitting: Endocrinology

## 2020-02-19 ENCOUNTER — Other Ambulatory Visit (HOSPITAL_BASED_OUTPATIENT_CLINIC_OR_DEPARTMENT_OTHER): Payer: Self-pay | Admitting: Endocrinology

## 2020-02-19 DIAGNOSIS — E109 Type 1 diabetes mellitus without complications: Secondary | ICD-10-CM | POA: Diagnosis not present

## 2020-02-19 DIAGNOSIS — E089 Diabetes mellitus due to underlying condition without complications: Secondary | ICD-10-CM | POA: Diagnosis not present

## 2020-02-19 DIAGNOSIS — Z794 Long term (current) use of insulin: Secondary | ICD-10-CM | POA: Diagnosis not present

## 2020-02-19 MED FILL — DEXCOM G6 TRANSMITTER MISC: 90 days supply | Qty: 1 | Fill #0

## 2020-02-19 MED FILL — DEXCOM G6 RECEIVER DEVI: 30 days supply | Qty: 1 | Fill #0

## 2020-02-19 MED FILL — DEXCOM G6 SENSOR MISC: 30 days supply | Qty: 3 | Fill #0

## 2020-03-03 MED FILL — FREESTYLE LITE METER: 30 days supply | Qty: 1 | Fill #0

## 2020-03-03 MED FILL — FREESTYLE LITE TEST STRIP: 90 days supply | Qty: 200 | Fill #0

## 2020-03-09 MED FILL — AMLODIPINE BESYLATE 10 MG T: 10 | 90 days supply | Qty: 90 | Fill #0

## 2020-03-09 MED FILL — QUINAPRIL 20 MG TABLET: 20 | 90 days supply | Qty: 180 | Fill #0

## 2020-03-10 ENCOUNTER — Other Ambulatory Visit (HOSPITAL_BASED_OUTPATIENT_CLINIC_OR_DEPARTMENT_OTHER): Payer: Self-pay | Admitting: Endocrinology

## 2020-03-10 MED FILL — SPIRONOLACTONE 50 MG TABLET: 50 | 90 days supply | Qty: 90 | Fill #0

## 2020-03-25 MED FILL — DEXCOM G6 SENSOR MISC: 30 days supply | Qty: 3 | Fill #1

## 2020-04-01 DIAGNOSIS — E039 Hypothyroidism, unspecified: Secondary | ICD-10-CM | POA: Diagnosis not present

## 2020-04-01 DIAGNOSIS — E089 Diabetes mellitus due to underlying condition without complications: Secondary | ICD-10-CM | POA: Diagnosis not present

## 2020-04-01 DIAGNOSIS — I10 Essential (primary) hypertension: Secondary | ICD-10-CM | POA: Diagnosis not present

## 2020-04-01 DIAGNOSIS — Z794 Long term (current) use of insulin: Secondary | ICD-10-CM | POA: Diagnosis not present

## 2020-04-05 DIAGNOSIS — E109 Type 1 diabetes mellitus without complications: Secondary | ICD-10-CM | POA: Diagnosis not present

## 2020-04-05 DIAGNOSIS — E108 Type 1 diabetes mellitus with unspecified complications: Secondary | ICD-10-CM | POA: Diagnosis not present

## 2020-04-07 ENCOUNTER — Ambulatory Visit: Payer: BC Managed Care – PPO | Admitting: Psychiatry

## 2020-04-10 ENCOUNTER — Other Ambulatory Visit: Payer: Self-pay | Admitting: Psychiatry

## 2020-04-10 ENCOUNTER — Ambulatory Visit (INDEPENDENT_AMBULATORY_CARE_PROVIDER_SITE_OTHER): Payer: 59 | Admitting: Psychiatry

## 2020-04-10 ENCOUNTER — Encounter: Payer: Self-pay | Admitting: Psychiatry

## 2020-04-10 DIAGNOSIS — F331 Major depressive disorder, recurrent, moderate: Secondary | ICD-10-CM | POA: Diagnosis not present

## 2020-04-10 DIAGNOSIS — F40240 Claustrophobia: Secondary | ICD-10-CM | POA: Diagnosis not present

## 2020-04-10 DIAGNOSIS — F422 Mixed obsessional thoughts and acts: Secondary | ICD-10-CM | POA: Diagnosis not present

## 2020-04-10 MED ORDER — FLUVOXAMINE MALEATE 100 MG PO TABS: 100.0000 mg | ORAL_TABLET | Freq: Two times a day (BID) | ORAL | 3 refills | Status: DC

## 2020-04-10 MED ORDER — PROPRANOLOL HCL 20 MG PO TABS: 20.0000 mg | ORAL_TABLET | Freq: Two times a day (BID) | ORAL | 3 refills | Status: DC | PRN

## 2020-04-10 MED FILL — PROPRANOLOL 20 MG TABLET: 20 | 90 days supply | Qty: 180 | Fill #0

## 2020-04-10 NOTE — Progress Notes (Signed)
Julia Holt [id number] 1961/01/07 59 y.o.  Subjective:   Patient ID:  Julia Holt is a 59 y.o. (DOB December 23, 1960) female.  Chief Complaint:  Chief Complaint  Patient presents with  . Follow-up    Medication Management  . Depression    Medication Management  . Anxiety    Depression        Associated symptoms include no decreased concentration, no fatigue and no suicidal ideas.  Past medical history includes anxiety.   Anxiety Symptoms include nervous/anxious behavior. Patient reports no confusion, decreased concentration, dizziness, palpitations or suicidal ideas.     Julia Holt presents to the office today for follow-up of psychiatric diagnoses.  seen March 2020.  Since then she elected to stop Wellbutrin out of concern about potential interaction with propranolol and drug screens at work.  Her mother died 02-10-19 she had dementia.  At the end she did not recognize her daughter.  It's been really hard.  Misses her.  She called in June asking to increase fluvoxamine to 150 or 200 mg daily.  Started travelling to [place] for work in July and so stressed by the commute.  Exhausted by the end of the day.  Prayed a lot about it.  Feels God told her to resign.  Leaving October 2.  Can't handle the drive anymore.  Anxious driving.  Feels good about the decision.  [name] has driven her some of the time.  Going into job search at [employer].    seen March 06, 2019.  She was still dealing with grief over the loss of her mother.  We made no med change.  She continued fluvoxamine 200 mg daily as well as propranolol 20 mg twice daily as needed anxiety.  seen January 2021.  No med changes.    10/30/19  Appt with the following noted: No job yet.  Still looking, but expects to get hired today.  Got through mother's day OK.  Managing OK.   Taking the propranolol mainly just once daily.  Still struggling over mother's death with holidays.  Ashes in urn with her dad.  All  the political and social unrest watching CNN is depressing.   No med changes  04/10/20 appt with the following noted: Doing well.  Some stress with new job and using a little more propranolol and it's been helpful for anxiety.  Taking 20 mg propranolol daily.  Patient reports grief issues but not depression.    Some anxierty and taking the propranolol daily but it helps.  Overall handling things OK.  Patient denies difficulty with sleep initiation or maintenance, but some negative effects temporarily related to grief and stress work.  I get enough sleep.  Denies appetite disturbance.  Patient reports that energy and motivation have been good.  Patient denies any difficulty with concentration.  Patient denies any suicidal ideation.  Dexcom DM meter.  Likes it.  Past Psychiatric Medication Trials:  Fluvoxamine, venlafaxine, Wellbutrin 300 helped, paroxetine 40 wt gain, propranolol  Her last episode of depression occurred in July 2019 and we increase the Wellbutrin to 300 mg and it resolved  Review of Systems:  Review of Systems  Constitutional: Negative for fatigue.  Cardiovascular: Negative for palpitations.  Neurological: Negative for dizziness, tremors and weakness.  Psychiatric/Behavioral: Positive for depression. Negative for agitation, behavioral problems, confusion, decreased concentration, dysphoric mood, hallucinations, self-injury, sleep disturbance and suicidal ideas. The patient is nervous/anxious. The patient is not hyperactive.     Medications: I  have reviewed the patient's current medications.  Current Outpatient Medications  Medication Sig Dispense Refill  . amLODipine (NORVASC) 10 MG tablet Take 10 mg by mouth daily.    Marland Kitchen aspirin 81 MG chewable tablet Chew 1 tablet (81 mg total) by mouth daily.    . Calcium Carbonate-Vitamin D3 600-400 MG-UNIT TABS Take by mouth 2 (two) times daily.    . Cholecalciferol (VITAMIN D) 50 MCG (2000 UT) tablet Take 2,000 Units by mouth daily.     Marland Kitchen glucose blood test strip 1 each by Other route as needed for other. Use as instructed    . Insulin Human (INSULIN PUMP) SOLN Inject into the skin. Husband has no idea what kind of insulin she takes. Husband states she is on a insulin pump. Pharmacy has a record of Novolog 60 units twice daily.    Marland Kitchen levothyroxine (SYNTHROID, LEVOTHROID) 137 MCG tablet Take 137 mcg by mouth daily before breakfast.    . Multiple Vitamins-Minerals (MULTIVITAMIN ADULTS 50+ PO) Take by mouth.    . quinapril (ACCUPRIL) 20 MG tablet Take 20 mg by mouth 2 (two) times daily.     Marland Kitchen spironolactone (ALDACTONE) 25 MG tablet Take 25 mg by mouth daily.    . fluvoxaMINE (LUVOX) 100 MG tablet Take 1 tablet (100 mg total) by mouth 2 (two) times daily. 180 tablet 3  . propranolol (INDERAL) 20 MG tablet Take 1 tablet (20 mg total) by mouth 2 (two) times daily as needed (anxiety). 180 tablet 3   No current facility-administered medications for this visit.    Medication Side Effects: None  Allergies:  Allergies  Allergen Reactions  . Latex Other (See Comments)  . Sulfa Antibiotics Hives and Itching    Past Medical History:  Diagnosis Date  . Diabetes mellitus without complication ([place])   . Hypertension   . Thyroid disease     Family History  Problem Relation Age of Onset  . Hypertension Mother   . Hyperlipidemia Mother   . Dementia Mother   . Alzheimer's disease Father     Social History   Socioeconomic History  . Marital status: Married    Spouse name: Not on file  . Number of children: Not on file  . Years of education: Not on file  . Highest education level: Not on file  Occupational History  . Not on file  Tobacco Use  . Smoking status: Never Smoker  . Smokeless tobacco: Never Used  Substance and Sexual Activity  . Alcohol use: No  . Drug use: No  . Sexual activity: Not on file  Other Topics Concern  . Not on file  Social History Narrative  . Not on file   Social Determinants of Health    Financial Resource Strain:   . Difficulty of Paying Living Expenses: Not on file  Food Insecurity:   . Worried About Charity fundraiser in the Last Year: Not on file  . Ran Out of Food in the Last Year: Not on file  Transportation Needs:   . Lack of Transportation (Medical): Not on file  . Lack of Transportation (Non-Medical): Not on file  Physical Activity:   . Days of Exercise per Week: Not on file  . Minutes of Exercise per Session: Not on file  Stress:   . Feeling of Stress : Not on file  Social Connections:   . Frequency of Communication with Friends and Family: Not on file  . Frequency of Social Gatherings with Friends and Family: Not  on file  . Attends Religious Services: Not on file  . Active Member of Clubs or Organizations: Not on file  . Attends Archivist Meetings: Not on file  . Marital Status: Not on file  Intimate Partner Violence:   . Fear of Current or Ex-Partner: Not on file  . Emotionally Abused: Not on file  . Physically Abused: Not on file  . Sexually Abused: Not on file    Past Medical History, Surgical history, Social history, and Family history were reviewed and updated as appropriate.   Please see review of systems for further details on the patient's review from today.   Objective:   Physical Exam:  There were no vitals taken for this visit.  Physical Exam Constitutional:      General: She is not in acute distress.    Appearance: She is well-developed.  Musculoskeletal:        General: No deformity.  Neurological:     Mental Status: She is alert and oriented to person, place, and time.     Motor: No tremor.     Coordination: Coordination normal.     Gait: Gait normal.  Psychiatric:        Attention and Perception: She does not perceive auditory hallucinations.        Mood and Affect: Mood is anxious. Mood is not depressed. Affect is not labile, blunt, angry, tearful or inappropriate.        Speech: Speech normal.         Behavior: Behavior normal.        Thought Content: Thought content normal. Thought content does not include homicidal or suicidal ideation. Thought content does not include homicidal or suicidal plan.        Cognition and Memory: Cognition normal.        Judgment: Judgment normal.     Comments: Insight intact. No auditory or visual hallucinations.  Anxiety is manageable.      Lab Review:     Component Value Date/Time   NA 141 06/06/2016 0416   K 3.7 06/06/2016 0416   CL 104 06/06/2016 0416   CO2 26 06/06/2016 0416   GLUCOSE 240 (H) 06/06/2016 0416   BUN 9 06/06/2016 0416   CREATININE 0.86 06/06/2016 0416   CALCIUM 8.4 (L) 06/06/2016 0416   PROT 5.8 (L) 06/05/2016 0618   ALBUMIN 3.2 (L) 06/05/2016 0618   AST 142 (H) 06/05/2016 0618   ALT 207 (H) 06/05/2016 0618   ALKPHOS 90 06/05/2016 0618   BILITOT 2.4 (H) 06/05/2016 0618   GFRNONAA >60 06/06/2016 0416   GFRAA >60 06/06/2016 0416       Component Value Date/Time   WBC 8.3 06/06/2016 0416   RBC 3.53 (L) 06/06/2016 0416   HGB 11.4 (L) 06/06/2016 0416   HCT 33.0 (L) 06/06/2016 0416   PLT 163 06/06/2016 0416   MCV 93.5 06/06/2016 0416   MCH 32.3 06/06/2016 0416   MCHC 34.5 06/06/2016 0416   RDW 13.5 06/06/2016 0416   LYMPHSABS 1.7 06/04/2016 0448   MONOABS 0.6 06/04/2016 0448   EOSABS 0.0 06/04/2016 0448   BASOSABS 0.0 06/04/2016 0448    No results found for: POCLITH, LITHIUM   No results found for: PHENYTOIN, PHENOBARB, VALPROATE, CBMZ   .res Assessment: Plan:    Major depressive disorder, recurrent episode, moderate ([place]) - Plan: fluvoxaMINE (LUVOX) 100 MG tablet  Mixed obsessional thoughts and acts - Plan: propranolol (INDERAL) 20 MG tablet, fluvoxaMINE (LUVOX) 100 MG tablet  Claustrophobia -  Plan: propranolol (INDERAL) 20 MG tablet  The patient has worsening of anxiety since she was here related to having to resign from a job and the loss of her mother.  The increase in Luvox was somewhat helpful as well  as the propranolol.  She is not markedly depressed and did not see major problems from discontinuing the Wellbutrin.  Historically her depressive episodes have typically been triggered.  Discussed discussed the stages of grief.  Some grief work was done related to the death of her mother in 2023/02/08.  Because it is been helpful continue propranolol low-dose 20 mg twice a day as needed for anxiety.  Discussed the possibility it could mask symptoms of hypoglycemia but at this tiny dose that should be not a problem.  Satisfied with the medications.  No change indicated. Continue fluvoxamine 200 mg daily. Propranolol 20 mg prn anxiety.  Disc SE in detail and SSRI withdrawal sx.  Follow-up 6-9 months.  Call if need to be seen sooner.  Lynder Parents MD, DFAPA   Please see After Visit Summary for patient specific instructions.  Future Appointments  Date Time Provider [place]  07/31/2020  2:30 PM Ronnald Nian, DO [facility]  12/14/2020  3:45 PM Rankin, Clent Demark, MD [facility] None    No orders of the defined types were placed in this encounter.     -------------------------------

## 2020-04-20 MED FILL — DEXCOM G6 SENSOR MISC: 30 days supply | Qty: 3 | Fill #2

## 2020-04-20 MED FILL — HumaLOG 100 UNIT/ML SOLN: 100 | 83 days supply | Qty: 50 | Fill #1

## 2020-05-18 MED FILL — DEXCOM G6 TRANSMITTER MISC: 90 days supply | Qty: 1 | Fill #0

## 2020-05-19 MED FILL — DEXCOM G6 SENSOR MISC: 30 days supply | Qty: 3 | Fill #3

## 2020-06-02 DIAGNOSIS — E109 Type 1 diabetes mellitus without complications: Secondary | ICD-10-CM | POA: Diagnosis not present

## 2020-06-02 DIAGNOSIS — E108 Type 1 diabetes mellitus with unspecified complications: Secondary | ICD-10-CM | POA: Diagnosis not present

## 2020-06-08 MED FILL — FLUVOXAMINE MALEATE 100 MG: 100 | 90 days supply | Qty: 180 | Fill #1

## 2020-06-08 MED FILL — SPIRONOLACTONE 50 MG TABLET: 50 | 90 days supply | Qty: 90 | Fill #1

## 2020-06-15 ENCOUNTER — Other Ambulatory Visit (HOSPITAL_BASED_OUTPATIENT_CLINIC_OR_DEPARTMENT_OTHER): Payer: Self-pay | Admitting: Endocrinology

## 2020-06-15 MED FILL — QUINAPRIL 20 MG TABLET: 20 | 90 days supply | Qty: 180 | Fill #0

## 2020-06-24 MED FILL — DEXCOM G6 SENSOR MISC: 30 days supply | Qty: 3 | Fill #4

## 2020-07-02 MED FILL — LEVOTHYROXINE SODIUM 150 MC: 150 | 84 days supply | Qty: 90 | Fill #0

## 2020-07-02 MED FILL — HumaLOG 100 UNIT/ML SOLN: 100 | 83 days supply | Qty: 50 | Fill #2

## 2020-07-13 ENCOUNTER — Other Ambulatory Visit (HOSPITAL_BASED_OUTPATIENT_CLINIC_OR_DEPARTMENT_OTHER): Payer: Self-pay | Admitting: Endocrinology

## 2020-07-13 MED FILL — AMLODIPINE BESYLATE 10 MG T: 10 | 90 days supply | Qty: 90 | Fill #0

## 2020-07-16 DIAGNOSIS — Z1231 Encounter for screening mammogram for malignant neoplasm of breast: Secondary | ICD-10-CM | POA: Diagnosis not present

## 2020-07-20 MED FILL — DEXCOM G6 SENSOR MISC: 30 days supply | Qty: 3 | Fill #5

## 2020-07-27 DIAGNOSIS — E109 Type 1 diabetes mellitus without complications: Secondary | ICD-10-CM | POA: Diagnosis not present

## 2020-07-27 DIAGNOSIS — E108 Type 1 diabetes mellitus with unspecified complications: Secondary | ICD-10-CM | POA: Diagnosis not present

## 2020-07-31 ENCOUNTER — Encounter: Payer: Self-pay | Admitting: Family Medicine

## 2020-07-31 ENCOUNTER — Other Ambulatory Visit: Payer: Self-pay

## 2020-07-31 ENCOUNTER — Ambulatory Visit (INDEPENDENT_AMBULATORY_CARE_PROVIDER_SITE_OTHER): Payer: 59 | Admitting: Family Medicine

## 2020-07-31 VITALS — BP 132/72 | HR 52 | Temp 97.1°F | Ht 68.5 in | Wt 181.6 lb

## 2020-07-31 DIAGNOSIS — I10 Essential (primary) hypertension: Secondary | ICD-10-CM | POA: Diagnosis not present

## 2020-07-31 DIAGNOSIS — E079 Disorder of thyroid, unspecified: Secondary | ICD-10-CM

## 2020-07-31 DIAGNOSIS — Z1321 Encounter for screening for nutritional disorder: Secondary | ICD-10-CM

## 2020-07-31 DIAGNOSIS — Z0000 Encounter for general adult medical examination without abnormal findings: Secondary | ICD-10-CM | POA: Diagnosis not present

## 2020-07-31 DIAGNOSIS — E109 Type 1 diabetes mellitus without complications: Secondary | ICD-10-CM | POA: Diagnosis not present

## 2020-07-31 DIAGNOSIS — Z124 Encounter for screening for malignant neoplasm of cervix: Secondary | ICD-10-CM | POA: Diagnosis not present

## 2020-07-31 NOTE — Patient Instructions (Signed)
MyChart: [facility]

## 2020-07-31 NOTE — Progress Notes (Signed)
Julia Holt is a 60 y.o. female  Chief Complaint  Patient presents with  . Establish Care    NP- CPE/labs. Wants just a pelvic exam due to having a pap done last year with Dr Teryl Lucy.        HPI: Julia Holt is a 60 y.o. female seen today as a new patient for annual CPE, labs, pelvic exam. She states she does not need a PAP as she had one done last year with GYN Dr. Glennon Hamilton and it was normal.   Last PAP: 07/2019 - normal - Dr. Teryl Lucy Last mammo: 06/2020 - normal Last dexa: 03/2017 - osteopenia - due in 03/2022 Last colonoscopy: 05/2017 - due in 05/2022 [facility]), father with CRC  Dental: UTD - [facility] Vision: UTD - Dr. Deloria Lair  Endo - Dr. Iran Planas - thyroid, DM type 1 Psych - Dr. Clovis Pu  Med refills needed today?     Past Medical History:  Diagnosis Date  . Anxiety   . Depression   . Diabetes mellitus without complication ([place])   . Hypertension   . Thyroid disease     Past Surgical History:  Procedure Laterality Date  . BACK SURGERY    . BUNIONECTOMY Left     Social History   Socioeconomic History  . Marital status: Married    Spouse name: Not on file  . Number of children: Not on file  . Years of education: Not on file  . Highest education level: Not on file  Occupational History  . Not on file  Tobacco Use  . Smoking status: Never Smoker  . Smokeless tobacco: Never Used  Vaping Use  . Vaping Use: Never used  Substance and Sexual Activity  . Alcohol use: No  . Drug use: No  . Sexual activity: Yes  Other Topics Concern  . Not on file  Social History Narrative  . Not on file   Social Determinants of Health   Financial Resource Strain: Not on file  Food Insecurity: Not on file  Transportation Needs: Not on file  Physical Activity: Not on file  Stress: Not on file  Social Connections: Not on file  Intimate Partner Violence: Not on file    Family History  Problem Relation Age of Onset  . Hypertension  Mother   . Hyperlipidemia Mother   . Dementia Mother   . Alzheimer's disease Father      Immunization History  Administered Date(s) Administered  . Influenza Inj Mdck Quad Pf 03/24/2017  . Influenza-Unspecified 03/03/2020  . PFIZER(Purple Top)SARS-COV-2 Vaccination 09/20/2019, 09/25/2019, 03/29/2020  . Pneumococcal Polysaccharide-23 06/06/2016    Outpatient Encounter Medications as of 07/31/2020  Medication Sig  . amLODipine (NORVASC) 10 MG tablet Take 10 mg by mouth daily.  Marland Kitchen aspirin 81 MG chewable tablet Chew 1 tablet (81 mg total) by mouth daily.  . Calcium Carbonate-Vitamin D3 600-400 MG-UNIT TABS Take by mouth 2 (two) times daily.  . Cholecalciferol (VITAMIN D) 50 MCG (2000 UT) tablet Take 2,000 Units by mouth daily.  . fluvoxaMINE (LUVOX) 100 MG tablet Take 1 tablet (100 mg total) by mouth 2 (two) times daily.  Marland Kitchen glucose blood test strip 1 each by Other route as needed for other. Use as instructed  . Insulin Human (INSULIN PUMP) SOLN Inject into the skin. Husband has no idea what kind of insulin she takes. Husband states she is on a insulin pump. Pharmacy has a record of Novolog 60  units twice daily.  Marland Kitchen levothyroxine (SYNTHROID, LEVOTHROID) 137 MCG tablet Take 137 mcg by mouth daily before breakfast.  . Multiple Vitamins-Minerals (MULTIVITAMIN ADULTS 50+ PO) Take by mouth.  . propranolol (INDERAL) 20 MG tablet Take 1 tablet (20 mg total) by mouth 2 (two) times daily as needed (anxiety).  . quinapril (ACCUPRIL) 20 MG tablet Take 20 mg by mouth 2 (two) times daily.   Marland Kitchen spironolactone (ALDACTONE) 25 MG tablet Take 25 mg by mouth daily.  . Continuous Blood Gluc Receiver (DEXCOM G6 RECEIVER) DEVI USE AS DIRECTED TO CHECK BLOOD SUGARS  . Continuous Blood Gluc Sensor (DEXCOM G6 SENSOR) MISC Apply topically as directed.  . Continuous Blood Gluc Transmit (DEXCOM G6 TRANSMITTER) MISC USE AS DIRECTED FOR CONTINUOUS GLUCOSE MONITORING. REUSE TRANSMITTER FOR 90 DAYS THEN DISCARD AND REPLACE.    No facility-administered encounter medications on file as of 07/31/2020.     ROS: Gen: no fever, chills  Skin: no rash, itching ENT: no ear pain, ear drainage, nasal congestion, rhinorrhea, sinus pressure, sore throat Eyes: no blurry vision, double vision Resp: no cough, wheeze,SOB Breast: no breast tenderness, no nipple discharge, no breast masses CV: no CP, palpitations, LE edema,  GI: no heartburn, n/v/d/c, abd pain GU: no dysuria, urgency, frequency, hematuria; no vaginal itching, odor, discharge MSK: no joint pain, myalgias, back pain Neuro: no dizziness, headache, weakness, vertigo Psych: no depression, anxiety, insomnia   Allergies  Allergen Reactions  . Latex Other (See Comments)  . Sulfa Antibiotics Hives and Itching    BP 132/72   Pulse (!) 52   Temp (!) 97.1 F (36.2 C) (Temporal)   Ht 5' 8.5" (1.74 m)   Wt 181 lb 9.6 oz (82.4 kg)   SpO2 94%   BMI 27.21 kg/m   Wt Readings from Last 3 Encounters:  07/31/20 181 lb 9.6 oz (82.4 kg)  09/27/17 201 lb (91.2 kg)  09/01/16 195 lb (88.5 kg)   Temp Readings from Last 3 Encounters:  07/31/20 (!) 97.1 F (36.2 C) (Temporal)  06/06/16 98.4 F (36.9 C) (Oral)  08/30/14 98.6 F (37 C) (Oral)   BP Readings from Last 3 Encounters:  07/31/20 132/72  09/27/17 130/78  09/01/16 130/70   Pulse Readings from Last 3 Encounters:  07/31/20 (!) 52  09/27/17 82  09/01/16 85     Physical Exam Exam conducted with a chaperone present.  Constitutional:      General: She is not in acute distress.    Appearance: She is well-developed and well-nourished.  HENT:     Head: Normocephalic and atraumatic.     Right Ear: Tympanic membrane and ear canal normal.     Left Ear: Tympanic membrane and ear canal normal.     Nose: Nose normal.     Mouth/Throat:     Mouth: Oropharynx is clear and moist and mucous membranes are normal.  Eyes:     Conjunctiva/sclera: Conjunctivae normal.     Pupils: Pupils are equal, round, and  reactive to light.  Neck:     Thyroid: No thyromegaly.  Cardiovascular:     Rate and Rhythm: Normal rate and regular rhythm.     Pulses: Intact distal pulses.     Heart sounds: Normal heart sounds. No murmur heard.   Pulmonary:     Effort: Pulmonary effort is normal. No respiratory distress.     Breath sounds: Normal breath sounds. No wheezing or rhonchi.  Abdominal:     General: Bowel sounds are normal. There is no  distension.     Palpations: Abdomen is soft. There is no mass.     Tenderness: There is no abdominal tenderness.  Genitourinary:    General: Normal vulva.     Labia:        Right: No rash, tenderness or lesion.        Left: No rash, tenderness or lesion.      Vagina: No vaginal discharge, erythema, tenderness, bleeding, lesions or prolapsed vaginal walls.     Cervix: No cervical motion tenderness, discharge, friability, erythema or cervical bleeding.     Uterus: Not deviated, not enlarged and not tender.      Adnexa:        Right: No mass, tenderness or fullness.         Left: No mass, tenderness or fullness.       Comments: Vaginal atrophy noted Musculoskeletal:        General: No edema.     Cervical back: Neck supple.  Lymphadenopathy:     Cervical: No cervical adenopathy.  Skin:    General: Skin is warm and dry.  Neurological:     Mental Status: She is alert and oriented to person, place, and time.     Motor: No abnormal muscle tone.     Coordination: Coordination normal.  Psychiatric:        Mood and Affect: Mood and affect normal.        Behavior: Behavior normal.      A/P:  1. Diabetes mellitus type 1, controlled, without complications ([place]) - pt follows with endocrinology with [facility] - cont regular f/u and current meds - Lipid panel  2. Primary hypertension - controlled, at goal - cont aldactone 25mg  daily, quinipril 20mg  BID, inderal 20mg  BID PRN, amlodipine 10mg  daily - Basic metabolic panel - CBC  3. Thyroid disease - on levothyroxine  156mcg daily - follow with [facility]o at [facility]  4. Annual physical exam - discussed importance of regular CV exercise, healthy diet, adequate sleep - UTD on PAP, mammo, dexa, colonoscopy - UTD on dental and vision - immunizations UTD - ALT - AST - Basic metabolic panel - CBC - next CPE in 1 year  5. Screening for cervical cancer - UTD on PAP - normal in 07/2019  6. Encounter for vitamin deficiency screening - VITAMIN D 25 Hydroxy (Vit-D Deficiency, Fractures)   This visit occurred during the SARS-CoV-2 public health emergency.  Safety protocols were in place, including screening questions prior to the visit, additional usage of staff PPE, and extensive cleaning of exam room while observing appropriate contact time as indicated for disinfecting solutions.

## 2020-08-01 LAB — AST: AST: 24 IU/L (ref 0–40)

## 2020-08-01 LAB — CBC
Hematocrit: 44.7 % (ref 34.0–46.6)
Hemoglobin: 14.8 g/dL (ref 11.1–15.9)
MCH: 31 pg (ref 26.6–33.0)
MCHC: 33.1 g/dL (ref 31.5–35.7)
MCV: 94 fL (ref 79–97)
Platelets: 237 10*3/uL (ref 150–450)
RBC: 4.78 x10E6/uL (ref 3.77–5.28)
RDW: 11.9 % (ref 11.7–15.4)
WBC: 8.5 10*3/uL (ref 3.4–10.8)

## 2020-08-01 LAB — BASIC METABOLIC PANEL
BUN/Creatinine Ratio: 23 (ref 9–23)
BUN: 18 mg/dL (ref 6–24)
CO2: 24 mmol/L (ref 20–29)
Calcium: 10 mg/dL (ref 8.7–10.2)
Chloride: 100 mmol/L (ref 96–106)
Creatinine, Ser: 0.78 mg/dL (ref 0.57–1.00)
GFR calc Af Amer: 97 mL/min/{1.73_m2} (ref >59)
GFR calc non Af Amer: 84 mL/min/{1.73_m2} (ref >59)
Glucose: 85 mg/dL (ref 65–99)
Potassium: 4.7 mmol/L (ref 3.5–5.2)
Sodium: 139 mmol/L (ref 134–144)

## 2020-08-01 LAB — LIPID PANEL
Chol/HDL Ratio: 2.4 ratio (ref 0.0–4.4)
Cholesterol, Total: 172 mg/dL (ref 100–199)
HDL: 71 mg/dL (ref >39)
LDL Chol Calc (NIH): 87 mg/dL (ref 0–99)
Triglycerides: 74 mg/dL (ref 0–149)
VLDL Cholesterol Cal: 14 mg/dL (ref 5–40)

## 2020-08-01 LAB — ALT: ALT: 20 IU/L (ref 0–32)

## 2020-08-01 LAB — VITAMIN D 25 HYDROXY (VIT D DEFICIENCY, FRACTURES): Vit D, 25-Hydroxy: 42.1 ng/mL (ref 30.0–100.0)

## 2020-08-05 DIAGNOSIS — E108 Type 1 diabetes mellitus with unspecified complications: Secondary | ICD-10-CM | POA: Diagnosis not present

## 2020-08-05 DIAGNOSIS — Z794 Long term (current) use of insulin: Secondary | ICD-10-CM | POA: Diagnosis not present

## 2020-08-05 DIAGNOSIS — E039 Hypothyroidism, unspecified: Secondary | ICD-10-CM | POA: Diagnosis not present

## 2020-08-05 DIAGNOSIS — I10 Essential (primary) hypertension: Secondary | ICD-10-CM | POA: Diagnosis not present

## 2020-08-18 MED FILL — DEXCOM G6 TRANSMITTER MISC: 90 days supply | Qty: 1 | Fill #1

## 2020-08-18 MED FILL — DEXCOM G6 SENSOR MISC: 30 days supply | Qty: 3 | Fill #6

## 2020-08-31 ENCOUNTER — Other Ambulatory Visit (HOSPITAL_BASED_OUTPATIENT_CLINIC_OR_DEPARTMENT_OTHER): Payer: Self-pay | Admitting: Endocrinology

## 2020-09-14 ENCOUNTER — Other Ambulatory Visit (HOSPITAL_BASED_OUTPATIENT_CLINIC_OR_DEPARTMENT_OTHER): Payer: Self-pay | Admitting: Dentistry

## 2020-09-15 ENCOUNTER — Other Ambulatory Visit (HOSPITAL_BASED_OUTPATIENT_CLINIC_OR_DEPARTMENT_OTHER): Payer: Self-pay | Admitting: Endocrinology

## 2020-09-16 MED FILL — CHLORHEXIDINE 0.12% RINSE: 0.12 | 22 days supply | Qty: 946 | Fill #0

## 2020-09-17 DIAGNOSIS — E109 Type 1 diabetes mellitus without complications: Secondary | ICD-10-CM | POA: Diagnosis not present

## 2020-09-17 DIAGNOSIS — E108 Type 1 diabetes mellitus with unspecified complications: Secondary | ICD-10-CM | POA: Diagnosis not present

## 2020-09-21 ENCOUNTER — Other Ambulatory Visit: Payer: Self-pay | Admitting: Endocrinology

## 2020-09-21 ENCOUNTER — Other Ambulatory Visit (HOSPITAL_BASED_OUTPATIENT_CLINIC_OR_DEPARTMENT_OTHER): Payer: Self-pay

## 2020-09-21 MED FILL — Continuous Glucose System Sensor: 30 days supply | Qty: 3 | Fill #0 | Status: AC

## 2020-09-22 ENCOUNTER — Other Ambulatory Visit (HOSPITAL_BASED_OUTPATIENT_CLINIC_OR_DEPARTMENT_OTHER): Payer: Self-pay

## 2020-09-25 ENCOUNTER — Other Ambulatory Visit (HOSPITAL_BASED_OUTPATIENT_CLINIC_OR_DEPARTMENT_OTHER): Payer: Self-pay

## 2020-09-25 ENCOUNTER — Other Ambulatory Visit: Payer: Self-pay | Admitting: Family Medicine

## 2020-09-25 MED ORDER — LEVOTHYROXINE SODIUM 150 MCG PO TABS
150.0000 ug | ORAL_TABLET | Freq: Every day | ORAL | 1 refills | Status: DC
  Filled 2020-09-25: qty 90, 90d supply, fill #0
  Filled 2020-12-17: qty 90, 90d supply, fill #1

## 2020-09-25 MED ORDER — AMLODIPINE BESYLATE 10 MG PO TABS
ORAL_TABLET | Freq: Every day | ORAL | 3 refills | Status: DC
  Filled 2020-09-25 – 2021-01-26 (×2): qty 90, 90d supply, fill #0
  Filled 2021-05-11: qty 90, 90d supply, fill #1
  Filled 2021-08-17: qty 90, 90d supply, fill #2

## 2020-09-25 MED ORDER — QUINAPRIL HCL 20 MG PO TABS
ORAL_TABLET | ORAL | 1 refills | Status: DC
  Filled 2020-09-25 – 2020-12-17 (×2): qty 180, 90d supply, fill #0
  Filled 2021-04-07: qty 180, 90d supply, fill #1

## 2020-09-25 MED FILL — Quinapril HCl Tab 20 MG: ORAL | 90 days supply | Qty: 180 | Fill #0 | Status: CN

## 2020-09-25 MED FILL — Insulin Lispro Subcutaneous Soln 100 Unit/ML: SUBCUTANEOUS | 50 days supply | Qty: 30 | Fill #0 | Status: AC

## 2020-09-29 ENCOUNTER — Other Ambulatory Visit (HOSPITAL_BASED_OUTPATIENT_CLINIC_OR_DEPARTMENT_OTHER): Payer: Self-pay

## 2020-10-02 ENCOUNTER — Other Ambulatory Visit (HOSPITAL_BASED_OUTPATIENT_CLINIC_OR_DEPARTMENT_OTHER): Payer: Self-pay

## 2020-10-19 ENCOUNTER — Other Ambulatory Visit (HOSPITAL_BASED_OUTPATIENT_CLINIC_OR_DEPARTMENT_OTHER): Payer: Self-pay

## 2020-10-19 MED FILL — Continuous Glucose System Sensor: 30 days supply | Qty: 3 | Fill #1 | Status: AC

## 2020-10-20 ENCOUNTER — Other Ambulatory Visit (HOSPITAL_BASED_OUTPATIENT_CLINIC_OR_DEPARTMENT_OTHER): Payer: Self-pay

## 2020-10-20 MED ORDER — AMLODIPINE BESYLATE 10 MG PO TABS
1.0000 | ORAL_TABLET | Freq: Every day | ORAL | 0 refills | Status: DC
  Filled 2020-10-20: qty 90, 90d supply, fill #0

## 2020-10-21 ENCOUNTER — Other Ambulatory Visit (HOSPITAL_BASED_OUTPATIENT_CLINIC_OR_DEPARTMENT_OTHER): Payer: Self-pay

## 2020-11-03 ENCOUNTER — Other Ambulatory Visit (HOSPITAL_BASED_OUTPATIENT_CLINIC_OR_DEPARTMENT_OTHER): Payer: Self-pay

## 2020-11-03 MED ORDER — "INSULIN SYRINGE 29G X 1/2"" 1 ML MISC"
1 refills | Status: AC
  Filled 2020-11-03: qty 100, 90d supply, fill #0
  Filled 2020-11-03: qty 50, 50d supply, fill #0

## 2020-11-06 ENCOUNTER — Ambulatory Visit: Payer: 59 | Admitting: Family Medicine

## 2020-11-13 DIAGNOSIS — E109 Type 1 diabetes mellitus without complications: Secondary | ICD-10-CM | POA: Diagnosis not present

## 2020-11-13 DIAGNOSIS — E108 Type 1 diabetes mellitus with unspecified complications: Secondary | ICD-10-CM | POA: Diagnosis not present

## 2020-11-19 ENCOUNTER — Other Ambulatory Visit: Payer: Self-pay | Admitting: Endocrinology

## 2020-11-19 ENCOUNTER — Other Ambulatory Visit (HOSPITAL_BASED_OUTPATIENT_CLINIC_OR_DEPARTMENT_OTHER): Payer: Self-pay

## 2020-11-19 MED ORDER — DEXCOM G6 SENSOR MISC
3 refills | Status: DC
  Filled 2020-11-19: qty 9, 90d supply, fill #0
  Filled 2020-12-17: qty 3, 30d supply, fill #0
  Filled 2020-12-17: qty 9, 90d supply, fill #0
  Filled 2021-01-23: qty 3, 30d supply, fill #1
  Filled 2021-02-18: qty 3, 30d supply, fill #2
  Filled 2021-03-22: qty 3, 30d supply, fill #3
  Filled 2021-04-21: qty 3, 30d supply, fill #4
  Filled 2021-05-17: qty 3, 30d supply, fill #5
  Filled 2021-06-15: qty 3, 30d supply, fill #6
  Filled 2021-07-18: qty 3, 30d supply, fill #7
  Filled 2021-08-22: qty 3, 30d supply, fill #8
  Filled 2021-09-26: qty 3, 30d supply, fill #9
  Filled 2021-10-17 – 2021-10-31 (×2): qty 3, 30d supply, fill #10

## 2020-11-19 MED ORDER — DEXCOM G6 TRANSMITTER MISC
3 refills | Status: DC
Start: 2020-11-19 — End: 2022-03-17
  Filled 2020-11-19: qty 1, 90d supply, fill #0
  Filled 2021-02-19: qty 1, 90d supply, fill #1
  Filled 2021-06-15: qty 1, 90d supply, fill #2
  Filled 2021-08-22 – 2021-08-26 (×3): qty 1, 90d supply, fill #3

## 2020-11-19 MED FILL — Continuous Glucose System Sensor: 30 days supply | Qty: 3 | Fill #2 | Status: AC

## 2020-11-20 ENCOUNTER — Other Ambulatory Visit (HOSPITAL_BASED_OUTPATIENT_CLINIC_OR_DEPARTMENT_OTHER): Payer: Self-pay

## 2020-12-03 DIAGNOSIS — I10 Essential (primary) hypertension: Secondary | ICD-10-CM | POA: Diagnosis not present

## 2020-12-03 DIAGNOSIS — E039 Hypothyroidism, unspecified: Secondary | ICD-10-CM | POA: Diagnosis not present

## 2020-12-03 DIAGNOSIS — E109 Type 1 diabetes mellitus without complications: Secondary | ICD-10-CM | POA: Diagnosis not present

## 2020-12-03 DIAGNOSIS — Z9641 Presence of insulin pump (external) (internal): Secondary | ICD-10-CM | POA: Diagnosis not present

## 2020-12-09 ENCOUNTER — Other Ambulatory Visit: Payer: Self-pay

## 2020-12-09 ENCOUNTER — Ambulatory Visit (INDEPENDENT_AMBULATORY_CARE_PROVIDER_SITE_OTHER): Payer: 59 | Admitting: Psychiatry

## 2020-12-09 ENCOUNTER — Encounter: Payer: Self-pay | Admitting: Psychiatry

## 2020-12-09 DIAGNOSIS — F331 Major depressive disorder, recurrent, moderate: Secondary | ICD-10-CM

## 2020-12-09 DIAGNOSIS — F40240 Claustrophobia: Secondary | ICD-10-CM | POA: Diagnosis not present

## 2020-12-09 DIAGNOSIS — F422 Mixed obsessional thoughts and acts: Secondary | ICD-10-CM | POA: Diagnosis not present

## 2020-12-09 NOTE — Progress Notes (Signed)
Julia Holt [id number] 1961/01/12 60 y.o.  Subjective:   Patient ID:  Julia Holt is a 60 y.o. (DOB 02-26-1961) female.  Chief Complaint:  Chief Complaint  Patient presents with   Follow-up   Fatigue   Stress    H health    Depression        Associated symptoms include fatigue.  Associated symptoms include no decreased concentration and no suicidal ideas.  Past medical history includes anxiety.   Anxiety Symptoms include nervous/anxious behavior. Patient reports no confusion, decreased concentration, dizziness, palpitations or suicidal ideas.    Julia Holt presents to the office today for follow-up of psychiatric diagnoses.  seen March 2020.  Since then she elected to stop Wellbutrin out of concern about potential interaction with propranolol and drug screens at work.  Her mother died 2019-02-03 she had dementia.  At the end she did not recognize her daughter.  It's been really hard.  Misses her.  She called in June asking to increase fluvoxamine to 150 or 200 mg daily.  Started travelling to [place] for work in July and so stressed by the commute.  Exhausted by the end of the day.  Prayed a lot about it.  Feels God told her to resign.  Leaving October 2.  Can't handle the drive anymore.  Anxious driving.  Feels good about the decision.  [name] has driven her some of the time.  Going into job search at [employer].    seen March 06, 2019.  She was still dealing with grief over the loss of her mother.  We made no med change.  She continued fluvoxamine 200 mg daily as well as propranolol 20 mg twice daily as needed anxiety.  seen January 2021.  No med changes.    10/30/19  Appt with the following noted: No job yet.  Still looking, but expects to get hired today.  Got through mother's day OK.  Managing OK.   Taking the propranolol mainly just once daily.  Still struggling over mother's death with holidays.  Ashes in urn with her dad.  All the political and  social unrest watching CNN is depressing.   No med changes  04/10/20 appt with the following noted: Doing well.  Some stress with new job and using a little more propranolol and it's been helpful for anxiety.  Taking 20 mg propranolol daily. Plan: Satisfied with the medications.  No change indicated. Continue fluvoxamine 200 mg daily. Propranolol 20 mg prn anxiety.  12/09/2020 appointment with the following noted: A little more tired lately. H low platelets and has been sick.  Stressful.  Up and down is frustrating.  He's tired all the time.  Worries over H.  Wants him to feel better.  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 No SE.  Patient reports grief issues but not depression.    Some anxierty and taking the propranolol daily but it helps.  Overall handling things OK.  Patient denies difficulty with sleep initiation or maintenance, but some negative effects temporarily related to grief and stress work.  I get enough sleep.  Denies appetite disturbance.  Patient reports that energy and motivation have been good.  Patient denies any difficulty with concentration.  Patient denies any suicidal ideation.  Dexcom DM meter.  Likes it.  Past Psychiatric Medication Trials:  Fluvoxamine, venlafaxine, Wellbutrin 300 helped, paroxetine 40 wt gain, propranolol  Her last episode of depression occurred in July 2019 and we increase  the Wellbutrin to 300 mg and it resolved  Review of Systems:  Review of Systems  Constitutional:  Positive for fatigue.  Cardiovascular:  Negative for palpitations.  Neurological:  Negative for dizziness, tremors and weakness.  Psychiatric/Behavioral:  Negative for agitation, behavioral problems, confusion, decreased concentration, dysphoric mood, hallucinations, self-injury, sleep disturbance and suicidal ideas. The patient is nervous/anxious. The patient is not hyperactive.    Medications: I have reviewed the patient's current medications.  Current  Outpatient Medications  Medication Sig Dispense Refill   amLODipine (NORVASC) 10 MG tablet TAKE 1 TABLET BY MOUTH ONCE DAILY 90 tablet 3   amLODipine (NORVASC) 10 MG tablet TAKE 1 TABLET BY MOUTH ONCE DAILY 90 tablet 0   aspirin 81 MG chewable tablet Chew 1 tablet (81 mg total) by mouth daily.     Calcium Carbonate-Vitamin D3 600-400 MG-UNIT TABS Take by mouth 2 (two) times daily.     chlorhexidine (PERIDEX) 0.12 % solution SWISH FOR 30 SECONDS AND SPIT 15 MLS 3 TIMES A DAY FOR 2 WEEKS 946 mL 1   Cholecalciferol (VITAMIN D) 50 MCG (2000 UT) tablet Take 2,000 Units by mouth daily.     Continuous Blood Gluc Receiver (DEXCOM G6 RECEIVER) DEVI USE AS DIRECTED TO CHECK BLOOD SUGARS     Continuous Blood Gluc Sensor (DEXCOM G6 SENSOR) MISC Apply topically as directed.     Continuous Blood Gluc Sensor (DEXCOM G6 SENSOR) MISC USE AS DIRECTED TO CHECK BLOOD SUGAR 9 each 3   Continuous Blood Gluc Sensor (DEXCOM G6 SENSOR) MISC Use one sensor every 10 days as directed 9 each 3   Continuous Blood Gluc Transmit (DEXCOM G6 TRANSMITTER) MISC USE AS DIRECTED FOR CONTINUOUS GLUCOSE MONITORING. REUSE TRANSMITTER FOR 90 DAYS THEN DISCARD AND REPLACE.     Continuous Blood Gluc Transmit (DEXCOM G6 TRANSMITTER) MISC USE AS DIRECTED FOR CONTINUOUS GLUCOSE MONITORING. REUSE TRANSMITTER FOR 90 DAYS THEN DISCARD AND REPLACE. 1 each 1   Continuous Blood Gluc Transmit (DEXCOM G6 TRANSMITTER) MISC Use as directed every 3 months 1 each 3   fluvoxaMINE (LUVOX) 100 MG tablet TAKE 1 TABLET (100 MG TOTAL) BY MOUTH 2 (TWO) TIMES DAILY. 180 tablet 3   glucose blood test strip 1 each by Other route as needed for other. Use as instructed     HUMALOG 100 UNIT/ML injection USE UP TO 60 UNITS DAILY IN INSULIN PUMP 60 mL 2   Insulin Human (INSULIN PUMP) SOLN Inject into the skin. Husband has no idea what kind of insulin she takes. Husband states she is on a insulin pump. Pharmacy has a record of Novolog 60 units twice daily.     INSULIN  SYRINGE 1CC/29G 29G X 1/2" 1 ML MISC Use to inject insulin daily 50 each 1   levothyroxine (SYNTHROID) 150 MCG tablet TAKE 1 TABLET BY MOUTH DAILY EXCEPT ON MONDAYS TAKE AN ADDITIONAL 1/2 TABLET 115 tablet 0   levothyroxine (SYNTHROID) 150 MCG tablet TAKE 1 TABLET (150 MCG) BY MOUTH DAILY EXCEPT MONDAY, TAKE ADDITIONAL 1/2 TABLET 115 tablet 1   levothyroxine (SYNTHROID, LEVOTHROID) 137 MCG tablet Take 137 mcg by mouth daily before breakfast.     Multiple Vitamins-Minerals (MULTIVITAMIN ADULTS 50+ PO) Take by mouth.     propranolol (INDERAL) 20 MG tablet TAKE 1 TABLET (20 MG TOTAL) BY MOUTH 2 (TWO) TIMES DAILY AS NEEDED (ANXIETY). 180 tablet 3   quinapril (ACCUPRIL) 20 MG tablet Take 20 mg by mouth 2 (two) times daily.      quinapril (ACCUPRIL) 20  MG tablet TAKE 1 TABLET (20 MG TOTAL) BY MOUTH 2 TIMES DAILY. 180 tablet 1   quinapril (ACCUPRIL) 20 MG tablet TAKE 1 TABLET BY MOUTH TWICE DAILY 180 tablet 1   quinapril (ACCUPRIL) 20 MG tablet Take 1 tablet (20 mg total) by mouth 2 times daily. 180 tablet 1   spironolactone (ALDACTONE) 25 MG tablet Take 25 mg by mouth daily.     spironolactone (ALDACTONE) 50 MG tablet TAKE 1 TABLET BY MOUTH EVERY DAY 90 tablet 1   spironolactone (ALDACTONE) 50 MG tablet TAKE 1 TABLET BY MOUTH EVERY DAY 90 tablet 1   No current facility-administered medications for this visit.    Medication Side Effects: None  Allergies:  Allergies  Allergen Reactions   Latex Other (See Comments)   Sulfa Antibiotics Hives and Itching    Past Medical History:  Diagnosis Date   Anxiety    Depression    Diabetes mellitus without complication ([place])    Hypertension    Thyroid disease     Family History  Problem Relation Age of Onset   Hypertension Mother    Hyperlipidemia Mother    Dementia Mother    Alzheimer's disease Father     Social History   Socioeconomic History   Marital status: Married    Spouse name: Not on file   Number of children: Not on file   Years  of education: Not on file   Highest education level: Not on file  Occupational History   Not on file  Tobacco Use   Smoking status: Never   Smokeless tobacco: Never  Vaping Use   Vaping Use: Never used  Substance and Sexual Activity   Alcohol use: No   Drug use: No   Sexual activity: Yes  Other Topics Concern   Not on file  Social History Narrative   Not on file   Social Determinants of Health   Financial Resource Strain: Not on file  Food Insecurity: Not on file  Transportation Needs: Not on file  Physical Activity: Not on file  Stress: Not on file  Social Connections: Not on file  Intimate Partner Violence: Not on file    Past Medical History, Surgical history, Social history, and Family history were reviewed and updated as appropriate.   Please see review of systems for further details on the patient's review from today.   Objective:   Physical Exam:  There were no vitals taken for this visit.  Physical Exam Constitutional:      General: She is not in acute distress.    Appearance: She is well-developed.  Musculoskeletal:        General: No deformity.  Neurological:     Mental Status: She is alert and oriented to person, place, and time.     Motor: No tremor.     Coordination: Coordination normal.     Gait: Gait normal.  Psychiatric:        Attention and Perception: She does not perceive auditory hallucinations.        Mood and Affect: Mood is anxious. Mood is not depressed. Affect is not labile, blunt, angry, tearful or inappropriate.        Speech: Speech normal. Speech is not slurred.        Behavior: Behavior normal.        Thought Content: Thought content normal. Thought content does not include homicidal or suicidal ideation. Thought content does not include homicidal or suicidal plan.  Cognition and Memory: Cognition normal.        Judgment: Judgment normal.     Comments: Insight intact. No auditory or visual hallucinations.  Anxiety is  manageable.     Lab Review:     Component Value Date/Time   NA 139 07/31/2020 1540   K 4.7 07/31/2020 1540   CL 100 07/31/2020 1540   CO2 24 07/31/2020 1540   GLUCOSE 85 07/31/2020 1540   GLUCOSE 240 (H) 06/06/2016 0416   BUN 18 07/31/2020 1540   CREATININE 0.78 07/31/2020 1540   CALCIUM 10.0 07/31/2020 1540   PROT 5.8 (L) 06/05/2016 0618   ALBUMIN 3.2 (L) 06/05/2016 0618   AST 24 07/31/2020 1540   ALT 20 07/31/2020 1540   ALKPHOS 90 06/05/2016 0618   BILITOT 2.4 (H) 06/05/2016 0618   GFRNONAA 84 07/31/2020 1540   GFRAA 97 07/31/2020 1540       Component Value Date/Time   WBC 8.5 07/31/2020 1540   WBC 8.3 06/06/2016 0416   RBC 4.78 07/31/2020 1540   RBC 3.53 (L) 06/06/2016 0416   HGB 14.8 07/31/2020 1540   HCT 44.7 07/31/2020 1540   PLT 237 07/31/2020 1540   MCV 94 07/31/2020 1540   MCH 31.0 07/31/2020 1540   MCH 32.3 06/06/2016 0416   MCHC 33.1 07/31/2020 1540   MCHC 34.5 06/06/2016 0416   RDW 11.9 07/31/2020 1540   LYMPHSABS 1.7 06/04/2016 0448   MONOABS 0.6 06/04/2016 0448   EOSABS 0.0 06/04/2016 0448   BASOSABS 0.0 06/04/2016 0448    No results found for: POCLITH, LITHIUM   No results found for: PHENYTOIN, PHENOBARB, VALPROATE, CBMZ   .res Assessment: Plan:    Major depressive disorder, recurrent episode, moderate ([place])  Mixed obsessional thoughts and acts  Claustrophobia  The patient has worsening of anxiety since she was here related to having to resign from a job and the loss of her mother.  The increase in Luvox was somewhat helpful as well as the propranolol.  She is not markedly depressed and did not see major problems from discontinuing the Wellbutrin.  Historically her depressive episodes have typically been triggered.  Disc stress H's health.  Because it is been helpful continue propranolol low-dose 20 mg twice a day as needed for anxiety.  Discussed the possibility it could mask symptoms of hypoglycemia but at this tiny dose that should be  not a problem.  Satisfied with the medications.  No change indicated. Continue fluvoxamine 200 mg daily. Propranolol 20 mg prn anxiety. She takes it each AM.  Still helps  Disc SE in detail and SSRI withdrawal sx.  Follow-up 6-9 months.  Call if need to be seen sooner.  Lynder Parents MD, DFAPA   Please see After Visit Summary for patient specific instructions.  Future Appointments  Date Time Provider [place]  12/14/2020  3:45 PM Rankin, Clent Demark, MD RDE-RDE None    No orders of the defined types were placed in this encounter.     -------------------------------

## 2020-12-14 ENCOUNTER — Encounter (INDEPENDENT_AMBULATORY_CARE_PROVIDER_SITE_OTHER): Payer: BC Managed Care – PPO | Admitting: Ophthalmology

## 2020-12-14 ENCOUNTER — Other Ambulatory Visit: Payer: Self-pay

## 2020-12-14 ENCOUNTER — Encounter (INDEPENDENT_AMBULATORY_CARE_PROVIDER_SITE_OTHER): Payer: Self-pay | Admitting: Ophthalmology

## 2020-12-14 ENCOUNTER — Ambulatory Visit (INDEPENDENT_AMBULATORY_CARE_PROVIDER_SITE_OTHER): Payer: 59 | Admitting: Ophthalmology

## 2020-12-14 DIAGNOSIS — H2513 Age-related nuclear cataract, bilateral: Secondary | ICD-10-CM

## 2020-12-14 DIAGNOSIS — H43822 Vitreomacular adhesion, left eye: Secondary | ICD-10-CM | POA: Diagnosis not present

## 2020-12-14 DIAGNOSIS — E109 Type 1 diabetes mellitus without complications: Secondary | ICD-10-CM

## 2020-12-14 DIAGNOSIS — H43821 Vitreomacular adhesion, right eye: Secondary | ICD-10-CM

## 2020-12-14 LAB — HM DIABETES EYE EXAM

## 2020-12-14 NOTE — Progress Notes (Signed)
12/14/2020     CHIEF COMPLAINT Patient presents for Retina Follow Up (1 Year F/U OU//Pt denies noticeable changes to [place] OU since last visit. Pt denies ocular pain, flashes of light, or floaters OU. /A1c: 5.8, 06/22/LBS: 195 this afternoon/)   HISTORY OF PRESENT ILLNESS: Julia Holt is a 59 y.o. female who presents to the clinic today for:   HPI     Retina Follow Up           Diagnosis: Other   Laterality: both eyes   Onset: 1 year ago   Severity: mild   Duration: 1 year   Course: stable   Comments: 1 Year F/U OU  Pt denies noticeable changes to [place] OU since last visit. Pt denies ocular pain, flashes of light, or floaters OU.  A1c: 5.8, 06/22 LBS: 195 this afternoon        Last edited by Milly Jakob, [place] on 12/14/2020  4:01 PM.      Referring physician: Lilian Coma., MD No address on file  HISTORICAL INFORMATION:   Selected notes from the MEDICAL RECORD NUMBER    Lab Results  Component Value Date   HGBA1C 5.5 06/02/2016     CURRENT MEDICATIONS: No current outpatient medications on file. (Ophthalmic Drugs)   No current facility-administered medications for this visit. (Ophthalmic Drugs)   Current Outpatient Medications (Other)  Medication Sig   amLODipine (NORVASC) 10 MG tablet TAKE 1 TABLET BY MOUTH ONCE DAILY   amLODipine (NORVASC) 10 MG tablet TAKE 1 TABLET BY MOUTH ONCE DAILY   aspirin 81 MG chewable tablet Chew 1 tablet (81 mg total) by mouth daily.   Calcium Carbonate-Vitamin D3 600-400 MG-UNIT TABS Take by mouth 2 (two) times daily.   chlorhexidine (PERIDEX) 0.12 % solution SWISH FOR 30 SECONDS AND SPIT 15 MLS 3 TIMES A DAY FOR 2 WEEKS   Cholecalciferol (VITAMIN D) 50 MCG (2000 UT) tablet Take 2,000 Units by mouth daily.   Continuous Blood Gluc Receiver (DEXCOM G6 RECEIVER) DEVI USE AS DIRECTED TO CHECK BLOOD SUGARS   Continuous Blood Gluc Sensor (DEXCOM G6 SENSOR) MISC Apply topically as directed.   Continuous Blood Gluc Sensor (DEXCOM G6  SENSOR) MISC USE AS DIRECTED TO CHECK BLOOD SUGAR   Continuous Blood Gluc Sensor (DEXCOM G6 SENSOR) MISC Use one sensor every 10 days as directed   Continuous Blood Gluc Transmit (DEXCOM G6 TRANSMITTER) MISC USE AS DIRECTED FOR CONTINUOUS GLUCOSE MONITORING. REUSE TRANSMITTER FOR 90 DAYS THEN DISCARD AND REPLACE.   Continuous Blood Gluc Transmit (DEXCOM G6 TRANSMITTER) MISC USE AS DIRECTED FOR CONTINUOUS GLUCOSE MONITORING. REUSE TRANSMITTER FOR 90 DAYS THEN DISCARD AND REPLACE.   Continuous Blood Gluc Transmit (DEXCOM G6 TRANSMITTER) MISC Use as directed every 3 months   fluvoxaMINE (LUVOX) 100 MG tablet TAKE 1 TABLET (100 MG TOTAL) BY MOUTH 2 (TWO) TIMES DAILY.   glucose blood test strip 1 each by Other route as needed for other. Use as instructed   HUMALOG 100 UNIT/ML injection USE UP TO 60 UNITS DAILY IN INSULIN PUMP   Insulin Human (INSULIN PUMP) SOLN Inject into the skin. Husband has no idea what kind of insulin she takes. Husband states she is on a insulin pump. Pharmacy has a record of Novolog 60 units twice daily.   INSULIN SYRINGE 1CC/29G 29G X 1/2" 1 ML MISC Use to inject insulin daily   levothyroxine (SYNTHROID) 150 MCG tablet TAKE 1 TABLET BY MOUTH DAILY EXCEPT ON MONDAYS TAKE AN ADDITIONAL 1/2 TABLET  levothyroxine (SYNTHROID) 150 MCG tablet TAKE 1 TABLET (150 MCG) BY MOUTH DAILY EXCEPT MONDAY, TAKE ADDITIONAL 1/2 TABLET   levothyroxine (SYNTHROID, LEVOTHROID) 137 MCG tablet Take 137 mcg by mouth daily before breakfast.   Multiple Vitamins-Minerals (MULTIVITAMIN ADULTS 50+ PO) Take by mouth.   propranolol (INDERAL) 20 MG tablet TAKE 1 TABLET (20 MG TOTAL) BY MOUTH 2 (TWO) TIMES DAILY AS NEEDED (ANXIETY).   quinapril (ACCUPRIL) 20 MG tablet Take 20 mg by mouth 2 (two) times daily.    quinapril (ACCUPRIL) 20 MG tablet TAKE 1 TABLET (20 MG TOTAL) BY MOUTH 2 TIMES DAILY.   quinapril (ACCUPRIL) 20 MG tablet TAKE 1 TABLET BY MOUTH TWICE DAILY   quinapril (ACCUPRIL) 20 MG tablet Take 1  tablet (20 mg total) by mouth 2 times daily.   spironolactone (ALDACTONE) 25 MG tablet Take 25 mg by mouth daily.   spironolactone (ALDACTONE) 50 MG tablet TAKE 1 TABLET BY MOUTH EVERY DAY   spironolactone (ALDACTONE) 50 MG tablet TAKE 1 TABLET BY MOUTH EVERY DAY   No current facility-administered medications for this visit. (Other)      REVIEW OF SYSTEMS:    ALLERGIES Allergies  Allergen Reactions   Latex Other (See Comments)   Sulfa Antibiotics Hives and Itching    PAST MEDICAL HISTORY Past Medical History:  Diagnosis Date   Anxiety    Depression    Diabetes mellitus without complication ([place])    Hypertension    Thyroid disease    Past Surgical History:  Procedure Laterality Date   BACK SURGERY     BUNIONECTOMY Left     FAMILY HISTORY Family History  Problem Relation Age of Onset   Hypertension Mother    Hyperlipidemia Mother    Dementia Mother    Alzheimer's disease Father     SOCIAL HISTORY Social History   Tobacco Use   Smoking status: Never   Smokeless tobacco: Never  Vaping Use   Vaping Use: Never used  Substance Use Topics   Alcohol use: No   Drug use: No         OPHTHALMIC EXAM:  Base Eye Exam     Visual Acuity (ETDRS)       Right Left   Dist cc 20/20 20/20 -1    Correction: Glasses         Tonometry (Tonopen, 4:05 PM)       Right Left   Pressure 14 14         Pupils       Dark Light Shape React APD   Right 6 5 Round Brisk None   Left 7 6 Round Brisk None         Visual Fields (Counting fingers)       Left Right    Full Full         Extraocular Movement       Right Left    Full Full         Neuro/Psych     Oriented x3: Yes   Mood/Affect: Normal         Dilation     Both eyes: 1.0% Mydriacyl, 2.5% Phenylephrine @ 4:05 PM           Slit Lamp and Fundus Exam     External Exam       Right Left   External Normal Normal         Slit Lamp Exam       Right Left   Lids/Lashes  Normal Normal   Conjunctiva/Sclera White and quiet White and quiet   Cornea Clear Clear   Anterior Chamber Deep and quiet Deep and quiet   Iris Round and reactive Round and reactive   Lens 1+ Nuclear sclerosis 1+ Nuclear sclerosis   Anterior Vitreous Normal Normal         Fundus Exam       Right Left   Posterior Vitreous Normal Normal   Disc Normal Normal   C/D Ratio 0.1 0.35   Macula Normal Normal   Vessels no DR no DR   Periphery Normal Normal            IMAGING AND PROCEDURES  Imaging and Procedures for 12/14/20  OCT, Retina - OU - Both Eyes       Right Eye Quality was good. Scan locations included subfoveal. Central Foveal Thickness: 274. Progression has been stable. Findings include vitreomacular adhesion .   Left Eye Quality was good. Scan locations included subfoveal. Central Foveal Thickness: 277. Progression has been stable. Findings include vitreomacular adhesion .   Notes No detectable Edema or thickening macular, vitreomacular adhesion is physiologic not pathologic             ASSESSMENT/PLAN:  Diabetes mellitus type 1, controlled, without complications No detectable diabetic retinopathy, 34 years duration  The patient has diabetes without any evidence of retinopathy. The patient advised to maintain good blood glucose control, excellent blood pressure control, and favorable levels of cholesterol, low density lipoprotein, and high density lipoproteins. Follow up in 1 year was recommended. Explained that fluctuations in visual acuity , or "out of focus", may result from large variations of blood sugar control.  Nuclear sclerotic cataract of both eyes The nature of cataract was discussed with the patient as well as the elective nature of surgery. The patient was reassured that surgery at a later date does not put the patient at risk for a worse outcome. It was emphasized that the need for surgery is dictated by the patient's quality of life as  influenced by the cataract. Patient was instructed to maintain close follow up with their general eye care doctor.  Vitreomacular adhesion of right eye Physiologic nonpathologic OU     ICD-10-CM   1. Vitreomacular adhesion of right eye  H43.821 OCT, Retina - OU - Both Eyes    2. Vitreomacular adhesion of left eye  H43.822 OCT, Retina - OU - Both Eyes    3. Diabetes mellitus type 1, controlled, without complications (HCC)  H82.9     4. Nuclear sclerotic cataract of both eyes  H25.13       1.  2.  3.  Ophthalmic Meds Ordered this visit:  No orders of the defined types were placed in this encounter.      Return in about 1 year (around 12/14/2021) for DILATE OU, COLOR FP.  There are no Patient Instructions on file for this visit.   Explained the diagnoses, plan, and follow up with the patient and they expressed understanding.  Patient expressed understanding of the importance of proper follow up care.   Clent Demark Ranette Luckadoo M.D. Diseases & Surgery of the Retina and Vitreous Retina & Diabetic [place] 12/14/20     Abbreviations: M myopia (nearsighted); A astigmatism; H hyperopia (farsighted); P presbyopia; Mrx spectacle prescription;  CTL contact lenses; OD right eye; OS left eye; OU both eyes  XT exotropia; ET esotropia; PEK punctate epithelial keratitis; PEE punctate epithelial erosions; DES dry eye syndrome; MGD meibomian gland  dysfunction; ATs artificial tears; PFAT's preservative free artificial tears; [place] nuclear sclerotic cataract; PSC posterior subcapsular cataract; ERM epi-retinal membrane; PVD posterior vitreous detachment; RD retinal detachment; DM diabetes mellitus; DR diabetic retinopathy; NPDR non-proliferative diabetic retinopathy; PDR proliferative diabetic retinopathy; CSME clinically significant macular edema; DME diabetic macular edema; dbh dot blot hemorrhages; CWS cotton wool spot; POAG primary open angle glaucoma; C/D cup-to-disc ratio; HVF humphrey visual  field; GVF goldmann visual field; OCT optical coherence tomography; IOP intraocular pressure; BRVO Branch retinal vein occlusion; CRVO central retinal vein occlusion; CRAO central retinal artery occlusion; BRAO branch retinal artery occlusion; RT retinal tear; SB scleral buckle; PPV pars plana vitrectomy; VH Vitreous hemorrhage; PRP panretinal laser photocoagulation; IVK intravitreal kenalog; VMT vitreomacular traction; MH Macular hole;  NVD neovascularization of the disc; NVE neovascularization elsewhere; AREDS age related eye disease study; ARMD age related macular degeneration; POAG primary open angle glaucoma; EBMD epithelial/anterior basement membrane dystrophy; ACIOL anterior chamber intraocular lens; IOL intraocular lens; PCIOL posterior chamber intraocular lens; Phaco/IOL phacoemulsification with intraocular lens placement; [place] photorefractive keratectomy; LASIK laser assisted in situ keratomileusis; HTN hypertension; DM diabetes mellitus; COPD chronic obstructive pulmonary disease

## 2020-12-14 NOTE — Assessment & Plan Note (Addendum)

## 2020-12-14 NOTE — Assessment & Plan Note (Signed)
Physiologic nonpathologic OU

## 2020-12-14 NOTE — Assessment & Plan Note (Addendum)
No detectable diabetic retinopathy, 34 years duration  The patient has diabetes without any evidence of retinopathy. The patient advised to maintain good blood glucose control, excellent blood pressure control, and favorable levels of cholesterol, low density lipoprotein, and high density lipoproteins. Follow up in 1 year was recommended. Explained that fluctuations in visual acuity , or "out of focus", may result from large variations of blood sugar control.

## 2020-12-17 ENCOUNTER — Other Ambulatory Visit: Payer: Self-pay | Admitting: Endocrinology

## 2020-12-17 ENCOUNTER — Other Ambulatory Visit (HOSPITAL_BASED_OUTPATIENT_CLINIC_OR_DEPARTMENT_OTHER): Payer: Self-pay

## 2020-12-17 MED FILL — Spironolactone Tab 50 MG: ORAL | 90 days supply | Qty: 90 | Fill #0 | Status: AC

## 2020-12-17 MED FILL — Fluvoxamine Maleate Tab 100 MG: ORAL | 90 days supply | Qty: 180 | Fill #0 | Status: AC

## 2020-12-18 ENCOUNTER — Other Ambulatory Visit (HOSPITAL_BASED_OUTPATIENT_CLINIC_OR_DEPARTMENT_OTHER): Payer: Self-pay

## 2020-12-18 MED ORDER — HUMALOG 100 UNIT/ML IJ SOLN
INTRAMUSCULAR | 2 refills | Status: DC
  Filled 2020-12-18: qty 50, 84d supply, fill #0
  Filled 2021-03-22: qty 50, 84d supply, fill #1
  Filled 2021-06-15: qty 50, 84d supply, fill #2
  Filled 2021-10-17: qty 50, 84d supply, fill #3

## 2020-12-27 ENCOUNTER — Emergency Department (HOSPITAL_BASED_OUTPATIENT_CLINIC_OR_DEPARTMENT_OTHER): Payer: 59

## 2020-12-27 ENCOUNTER — Encounter (HOSPITAL_BASED_OUTPATIENT_CLINIC_OR_DEPARTMENT_OTHER): Payer: Self-pay | Admitting: Emergency Medicine

## 2020-12-27 ENCOUNTER — Other Ambulatory Visit: Payer: Self-pay

## 2020-12-27 ENCOUNTER — Emergency Department (HOSPITAL_BASED_OUTPATIENT_CLINIC_OR_DEPARTMENT_OTHER)
Admission: EM | Admit: 2020-12-27 | Discharge: 2020-12-27 | Disposition: A | Discharge status: Home / Self Care | Payer: 59 | Attending: Emergency Medicine | Admitting: Emergency Medicine

## 2020-12-27 DIAGNOSIS — I10 Essential (primary) hypertension: Secondary | ICD-10-CM | POA: Diagnosis not present

## 2020-12-27 DIAGNOSIS — Z7982 Long term (current) use of aspirin: Secondary | ICD-10-CM | POA: Insufficient documentation

## 2020-12-27 DIAGNOSIS — Z794 Long term (current) use of insulin: Secondary | ICD-10-CM | POA: Insufficient documentation

## 2020-12-27 DIAGNOSIS — M1711 Unilateral primary osteoarthritis, right knee: Secondary | ICD-10-CM | POA: Insufficient documentation

## 2020-12-27 DIAGNOSIS — Z79899 Other long term (current) drug therapy: Secondary | ICD-10-CM | POA: Diagnosis not present

## 2020-12-27 DIAGNOSIS — Z91040 Latex allergy status: Secondary | ICD-10-CM | POA: Diagnosis not present

## 2020-12-27 DIAGNOSIS — M25561 Pain in right knee: Secondary | ICD-10-CM | POA: Diagnosis not present

## 2020-12-27 DIAGNOSIS — E109 Type 1 diabetes mellitus without complications: Secondary | ICD-10-CM | POA: Diagnosis not present

## 2020-12-27 NOTE — ED Triage Notes (Signed)
Pt arrives pov with driver, c/o R knee pain with weakness, worsening over last few days. Pt ambulatory with assistance of cane today. Pt denies injury

## 2020-12-27 NOTE — ED Notes (Signed)
Portable Xray at bedside.

## 2020-12-27 NOTE — Discharge Instructions (Addendum)
If you develop fever, knee swelling, knee redness, increased pain, or any other new/concerning symptoms then return to the ER for evaluation

## 2020-12-27 NOTE — ED Provider Notes (Signed)
[facility] EMERGENCY DEPARTMENT Provider Note   CSN: [id number] Arrival date & time: 12/27/20  0730     History Chief Complaint  Patient presents with   Knee Pain    Julia Holt is a 60 y.o. female.  HPI 60 year old female presents with right knee pain.  This been bothering her for months but seems worse since yesterday.  No obvious trauma but she sits a lot at work and has pain when she stands up.  No significant swelling.  No fevers noted.  At times it will feel like it gives out on her and this happened this morning and with the worsening pain she went to come get checked out.  She has been following with [facility].  Has not take anything for the pain. No calf pain or swelling.  Past Medical History:  Diagnosis Date   Anxiety    Depression    Diabetes mellitus without complication ([place])    Hypertension    Thyroid disease     Patient Active Problem List   Diagnosis Date Noted   Vitreomacular adhesion of right eye 12/12/2019   Nuclear sclerotic cataract of both eyes 12/12/2019   Vitreomacular adhesion of left eye 12/12/2019   AKI (acute kidney injury) ([place]) 06/03/2016   Lactic acid acidosis 06/03/2016   Acute respiratory failure with hypoxia ([place]) 06/03/2016   DKA (diabetic ketoacidoses) 06/02/2016   Diabetes mellitus type 1, controlled, without complications ([place]) 83/66/2947   Thyroid disease 05/03/2013   Hypertension 05/03/2013    Past Surgical History:  Procedure Laterality Date   BACK SURGERY     BUNIONECTOMY Left      OB History   No obstetric history on file.     Family History  Problem Relation Age of Onset   Hypertension Mother    Hyperlipidemia Mother    Dementia Mother    Alzheimer's disease Father     Social History   Tobacco Use   Smoking status: Never   Smokeless tobacco: Never  Vaping Use   Vaping Use: Never used  Substance Use Topics   Alcohol use: No   Drug use: No    Home Medications Prior to Admission  medications   Medication Sig Start Date End Date Taking? Authorizing Provider  amLODipine (NORVASC) 10 MG tablet TAKE 1 TABLET BY MOUTH ONCE DAILY 09/25/20 09/25/21  Cirigliano, Mary K, DO  amLODipine (NORVASC) 10 MG tablet TAKE 1 TABLET BY MOUTH ONCE DAILY 10/20/20     aspirin 81 MG chewable tablet Chew 1 tablet (81 mg total) by mouth daily. 06/07/16   Johnson, Clanford L, MD  Calcium Carbonate-Vitamin D3 600-400 MG-UNIT TABS Take by mouth 2 (two) times daily.    [provider]  chlorhexidine (PERIDEX) 0.12 % solution SWISH FOR 30 SECONDS AND SPIT 15 MLS 3 TIMES A DAY FOR 2 WEEKS 09/14/20 09/14/21  Tanvishut, Dagmar Hait, DDS  Cholecalciferol (VITAMIN D) 50 MCG (2000 UT) tablet Take 2,000 Units by mouth daily.    [provider]  Continuous Blood Gluc Receiver (DEXCOM G6 RECEIVER) [place] USE AS DIRECTED TO CHECK BLOOD SUGARS 02/19/20   [provider]  Continuous Blood Gluc Sensor (DEXCOM G6 SENSOR) MISC Apply topically as directed. 07/20/20   [provider]  Continuous Blood Gluc Sensor (DEXCOM G6 SENSOR) MISC USE AS DIRECTED TO CHECK BLOOD SUGAR 02/19/20 02/18/21  Katheran James., MD  Continuous Blood Gluc Sensor (DEXCOM G6 SENSOR) MISC Use one sensor every 10 days as directed 11/19/20  Katheran James., MD  Continuous Blood Gluc Transmit (DEXCOM G6 TRANSMITTER) MISC USE AS DIRECTED FOR CONTINUOUS GLUCOSE MONITORING. REUSE TRANSMITTER FOR 90 DAYS THEN DISCARD AND REPLACE. 05/18/20   [provider]  Continuous Blood Gluc Transmit (DEXCOM G6 TRANSMITTER) MISC USE AS DIRECTED FOR CONTINUOUS GLUCOSE MONITORING. REUSE TRANSMITTER FOR 90 DAYS THEN DISCARD AND REPLACE. 02/14/20 02/13/21  Katheran James., MD  Continuous Blood Gluc Transmit (DEXCOM G6 TRANSMITTER) MISC Use as directed every 3 months 11/19/20   Katheran James., MD  fluvoxaMINE (LUVOX) 100 MG tablet TAKE 1 TABLET (100 MG TOTAL) BY MOUTH 2 (TWO) TIMES DAILY. 04/10/20 04/10/21  Cottle, Billey Co.,  MD  glucose blood test strip 1 each by Other route as needed for other. Use as instructed    [provider]  HUMALOG 100 UNIT/ML injection USE UP TO 60 UNITS DAILY IN INSULIN PUMP 02/04/20 02/03/21  Katheran James., MD  HUMALOG 100 UNIT/ML injection Use up to 60 units daily in insulin pump Maximum Daily Dose=60 units 12/18/20     Insulin Human (INSULIN PUMP) SOLN Inject into the skin. Husband has no idea what kind of insulin she takes. Husband states she is on a insulin pump. Pharmacy has a record of Novolog 60 units twice daily.    [provider]  INSULIN SYRINGE 1CC/29G 29G X 1/2" 1 ML MISC Use to inject insulin daily 11/03/20   Katheran James., MD  levothyroxine (SYNTHROID) 150 MCG tablet TAKE 1 TABLET BY MOUTH DAILY EXCEPT ON MONDAYS TAKE AN ADDITIONAL 1/2 TABLET 12/24/19 12/23/20  Katheran James., MD  levothyroxine (SYNTHROID) 150 MCG tablet TAKE 1 TABLET (150 MCG) BY MOUTH DAILY EXCEPT MONDAY, TAKE ADDITIONAL 1/2 TABLET 09/25/20     levothyroxine (SYNTHROID, LEVOTHROID) 137 MCG tablet Take 137 mcg by mouth daily before breakfast.    [provider]  Multiple Vitamins-Minerals (MULTIVITAMIN ADULTS 50+ PO) Take by mouth.    [provider]  propranolol (INDERAL) 20 MG tablet TAKE 1 TABLET (20 MG TOTAL) BY MOUTH 2 (TWO) TIMES DAILY AS NEEDED (ANXIETY). 04/10/20 04/10/21  Cottle, Billey Co., MD  quinapril (ACCUPRIL) 20 MG tablet Take 20 mg by mouth 2 (two) times daily.     [provider]  quinapril (ACCUPRIL) 20 MG tablet TAKE 1 TABLET (20 MG TOTAL) BY MOUTH 2 TIMES DAILY. 09/15/20 09/15/21  Katheran James., MD  quinapril (ACCUPRIL) 20 MG tablet TAKE 1 TABLET BY MOUTH TWICE DAILY 06/15/20 06/15/21  Katheran James., MD  quinapril (ACCUPRIL) 20 MG tablet Take 1 tablet (20 mg total) by mouth 2 times daily. 09/25/20     spironolactone (ALDACTONE) 25 MG tablet Take 25 mg by mouth daily.    [provider]  spironolactone  (ALDACTONE) 50 MG tablet TAKE 1 TABLET BY MOUTH EVERY DAY 08/31/20 08/31/21  Katheran James., MD  spironolactone (ALDACTONE) 50 MG tablet TAKE 1 TABLET BY MOUTH EVERY DAY 03/10/20 03/10/21  Katheran James., MD    Allergies    Latex and Sulfa antibiotics  Review of Systems   Review of Systems  Constitutional:  Negative for fever.  Musculoskeletal:  Positive for arthralgias. Negative for joint swelling.   Physical Exam Updated Vital Signs BP (!) 144/69 (BP Location: Right Arm)   Pulse 70   Temp 98.9 F (37.2 C) (Oral)   Resp 18   Ht 5\' 9"  (1.753 m)   Wt 77.1 kg  SpO2 98%   BMI 25.10 kg/m   Physical Exam Vitals and nursing note reviewed.  Constitutional:      Appearance: She is well-developed.  HENT:     Head: Normocephalic and atraumatic.     Right Ear: External ear normal.     Left Ear: External ear normal.     Nose: Nose normal.  Eyes:     General:        Right eye: No discharge.        Left eye: No discharge.  Cardiovascular:     Rate and Rhythm: Normal rate and regular rhythm.     Pulses:          Dorsalis pedis pulses are 2+ on the right side.  Pulmonary:     Effort: Pulmonary effort is normal.  Abdominal:     General: There is no distension.  Musculoskeletal:     Right knee: No swelling, deformity, effusion or erythema.     Right lower leg: No swelling or tenderness.     Comments: Some pain with flexion. No warmth, erythema. No localized tenderness.  Normal strength/sensation in right foot 5/5 strength in RLE when lifting off bed  Skin:    General: Skin is warm and dry.  Neurological:     Mental Status: She is alert.  Psychiatric:        Mood and Affect: Mood is not anxious.    ED Results / Procedures / Treatments   Labs (all labs ordered are listed, but only abnormal results are displayed) Labs Reviewed - No data to display  EKG None  Radiology DG Knee Complete 4 Views Right  Result Date: 12/27/2020 CLINICAL DATA:  Right knee pain  and weakness EXAM: RIGHT KNEE - COMPLETE 4+ VIEW COMPARISON:  None. FINDINGS: Moderate tricompartmental osteoarthritis with joint space loss, sclerosis and bony spurring involving all 3 compartments. Lateral compartment is most severe. No acute osseous finding, fracture or large effusion. Negative for fracture. No soft tissue abnormality. IMPRESSION: Right knee tricompartmental osteoarthritis. No acute osseous finding by plain radiography. Electronically Signed   By: Jerilynn Mages.  Shick M.D.   On: 12/27/2020 08:51    Procedures Procedures   Medications Ordered in ED Medications - No data to display  ED Course  I have reviewed the triage vital signs and the nursing notes.  Pertinent labs & imaging results that were available during my care of the patient were reviewed by me and considered in my medical decision making (see chart for details).    MDM Rules/Calculators/A&P                          Patient declines pain meds in ED. No effusion or signs of infection. Likely related to her chronic arthritis. No weakness or NV compromise on exam. Recommend tylenol/nsaids and follow up with her orthopedist.  Final Clinical Impression(s) / ED Diagnoses Final diagnoses:  Osteoarthritis of right knee, unspecified osteoarthritis type    Rx / DC Orders ED Discharge Orders     None        Sherwood Gambler, MD 12/27/20 0900

## 2020-12-27 NOTE — ED Notes (Signed)
ED Provider at bedside. 

## 2021-01-04 DIAGNOSIS — E108 Type 1 diabetes mellitus with unspecified complications: Secondary | ICD-10-CM | POA: Diagnosis not present

## 2021-01-04 DIAGNOSIS — E109 Type 1 diabetes mellitus without complications: Secondary | ICD-10-CM | POA: Diagnosis not present

## 2021-01-25 ENCOUNTER — Other Ambulatory Visit (HOSPITAL_BASED_OUTPATIENT_CLINIC_OR_DEPARTMENT_OTHER): Payer: Self-pay

## 2021-01-26 ENCOUNTER — Other Ambulatory Visit (HOSPITAL_BASED_OUTPATIENT_CLINIC_OR_DEPARTMENT_OTHER): Payer: Self-pay

## 2021-01-28 ENCOUNTER — Other Ambulatory Visit (HOSPITAL_BASED_OUTPATIENT_CLINIC_OR_DEPARTMENT_OTHER): Payer: Self-pay

## 2021-02-18 DIAGNOSIS — E109 Type 1 diabetes mellitus without complications: Secondary | ICD-10-CM | POA: Diagnosis not present

## 2021-02-18 DIAGNOSIS — E039 Hypothyroidism, unspecified: Secondary | ICD-10-CM | POA: Diagnosis not present

## 2021-02-18 DIAGNOSIS — E108 Type 1 diabetes mellitus with unspecified complications: Secondary | ICD-10-CM | POA: Diagnosis not present

## 2021-02-19 ENCOUNTER — Other Ambulatory Visit (HOSPITAL_BASED_OUTPATIENT_CLINIC_OR_DEPARTMENT_OTHER): Payer: Self-pay

## 2021-02-19 MED ORDER — LEVOTHYROXINE SODIUM 125 MCG PO TABS
125.0000 ug | ORAL_TABLET | Freq: Every day | ORAL | 3 refills | Status: DC
  Filled 2021-02-19: qty 30, 30d supply, fill #0
  Filled 2021-03-22: qty 30, 30d supply, fill #1

## 2021-03-04 DIAGNOSIS — R0789 Other chest pain: Secondary | ICD-10-CM | POA: Diagnosis not present

## 2021-03-18 ENCOUNTER — Other Ambulatory Visit (HOSPITAL_BASED_OUTPATIENT_CLINIC_OR_DEPARTMENT_OTHER): Payer: Self-pay

## 2021-03-22 ENCOUNTER — Other Ambulatory Visit (HOSPITAL_BASED_OUTPATIENT_CLINIC_OR_DEPARTMENT_OTHER): Payer: Self-pay

## 2021-03-23 ENCOUNTER — Other Ambulatory Visit (HOSPITAL_BASED_OUTPATIENT_CLINIC_OR_DEPARTMENT_OTHER): Payer: Self-pay

## 2021-03-23 MED ORDER — AMOXICILLIN 500 MG PO CAPS
ORAL_CAPSULE | ORAL | 0 refills | Status: DC
  Filled 2021-03-23: qty 20, 5d supply, fill #0

## 2021-04-02 ENCOUNTER — Other Ambulatory Visit: Payer: Self-pay | Admitting: Psychiatry

## 2021-04-02 DIAGNOSIS — R5383 Other fatigue: Secondary | ICD-10-CM

## 2021-04-02 DIAGNOSIS — F331 Major depressive disorder, recurrent, moderate: Secondary | ICD-10-CM

## 2021-04-02 DIAGNOSIS — R7989 Other specified abnormal findings of blood chemistry: Secondary | ICD-10-CM

## 2021-04-02 NOTE — Progress Notes (Signed)
Due to fatigue complaints check b12, vit D in addition to pending thyroid labs

## 2021-04-07 ENCOUNTER — Other Ambulatory Visit: Payer: Self-pay | Admitting: Endocrinology

## 2021-04-08 ENCOUNTER — Other Ambulatory Visit (HOSPITAL_BASED_OUTPATIENT_CLINIC_OR_DEPARTMENT_OTHER): Payer: Self-pay

## 2021-04-09 ENCOUNTER — Other Ambulatory Visit (HOSPITAL_BASED_OUTPATIENT_CLINIC_OR_DEPARTMENT_OTHER): Payer: Self-pay

## 2021-04-09 DIAGNOSIS — I10 Essential (primary) hypertension: Secondary | ICD-10-CM | POA: Diagnosis not present

## 2021-04-09 DIAGNOSIS — E109 Type 1 diabetes mellitus without complications: Secondary | ICD-10-CM | POA: Diagnosis not present

## 2021-04-09 DIAGNOSIS — E039 Hypothyroidism, unspecified: Secondary | ICD-10-CM | POA: Diagnosis not present

## 2021-04-09 LAB — HEMOGLOBIN A1C: Hemoglobin A1C: 6.1

## 2021-04-12 ENCOUNTER — Other Ambulatory Visit (HOSPITAL_BASED_OUTPATIENT_CLINIC_OR_DEPARTMENT_OTHER): Payer: Self-pay

## 2021-04-12 ENCOUNTER — Other Ambulatory Visit: Payer: Self-pay

## 2021-04-13 ENCOUNTER — Other Ambulatory Visit (HOSPITAL_BASED_OUTPATIENT_CLINIC_OR_DEPARTMENT_OTHER): Payer: Self-pay

## 2021-04-14 ENCOUNTER — Other Ambulatory Visit (HOSPITAL_BASED_OUTPATIENT_CLINIC_OR_DEPARTMENT_OTHER): Payer: Self-pay

## 2021-04-16 ENCOUNTER — Other Ambulatory Visit (HOSPITAL_BASED_OUTPATIENT_CLINIC_OR_DEPARTMENT_OTHER): Payer: Self-pay

## 2021-04-16 MED ORDER — SPIRONOLACTONE 50 MG PO TABS
50.0000 mg | ORAL_TABLET | Freq: Every day | ORAL | 1 refills | Status: DC
  Filled 2021-04-16: qty 90, 90d supply, fill #0
  Filled 2021-07-18: qty 90, 90d supply, fill #1

## 2021-04-21 ENCOUNTER — Other Ambulatory Visit (HOSPITAL_BASED_OUTPATIENT_CLINIC_OR_DEPARTMENT_OTHER): Payer: Self-pay

## 2021-04-22 ENCOUNTER — Other Ambulatory Visit (HOSPITAL_BASED_OUTPATIENT_CLINIC_OR_DEPARTMENT_OTHER): Payer: Self-pay

## 2021-04-23 DIAGNOSIS — E109 Type 1 diabetes mellitus without complications: Secondary | ICD-10-CM | POA: Diagnosis not present

## 2021-05-10 DIAGNOSIS — E109 Type 1 diabetes mellitus without complications: Secondary | ICD-10-CM | POA: Diagnosis not present

## 2021-05-12 ENCOUNTER — Other Ambulatory Visit (HOSPITAL_BASED_OUTPATIENT_CLINIC_OR_DEPARTMENT_OTHER): Payer: Self-pay

## 2021-05-16 DIAGNOSIS — Z20822 Contact with and (suspected) exposure to covid-19: Secondary | ICD-10-CM | POA: Diagnosis not present

## 2021-05-17 ENCOUNTER — Other Ambulatory Visit (HOSPITAL_BASED_OUTPATIENT_CLINIC_OR_DEPARTMENT_OTHER): Payer: Self-pay

## 2021-05-17 MED FILL — Quinapril HCl Tab 20 MG: ORAL | 90 days supply | Qty: 180 | Fill #0 | Status: CN

## 2021-05-31 ENCOUNTER — Other Ambulatory Visit: Payer: Self-pay

## 2021-06-02 ENCOUNTER — Encounter: Payer: Self-pay | Admitting: Nurse Practitioner

## 2021-06-02 ENCOUNTER — Ambulatory Visit: Payer: 59 | Admitting: Nurse Practitioner

## 2021-06-02 ENCOUNTER — Other Ambulatory Visit: Payer: Self-pay

## 2021-06-02 VITALS — BP 134/72 | HR 82 | Temp 97.3°F | Ht 67.75 in | Wt 176.0 lb

## 2021-06-02 DIAGNOSIS — E0869 Diabetes mellitus due to underlying condition with other specified complication: Secondary | ICD-10-CM | POA: Diagnosis not present

## 2021-06-02 DIAGNOSIS — I10 Essential (primary) hypertension: Secondary | ICD-10-CM | POA: Diagnosis not present

## 2021-06-02 DIAGNOSIS — M8589 Other specified disorders of bone density and structure, multiple sites: Secondary | ICD-10-CM | POA: Diagnosis not present

## 2021-06-02 DIAGNOSIS — F4323 Adjustment disorder with mixed anxiety and depressed mood: Secondary | ICD-10-CM | POA: Diagnosis not present

## 2021-06-02 DIAGNOSIS — Z794 Long term (current) use of insulin: Secondary | ICD-10-CM

## 2021-06-02 NOTE — Assessment & Plan Note (Addendum)
Concerned about height loss over the years 5'10" to 5'7" Fhx of osteoporosis: MGM and mother with osteoporosis. Hx of scoliosis with correction in 2015 per patient. No hx of bone fracture. No tobacco or ETOH use Stopped menstrual cycle at age 61. Current use of calcium 600mg  BID and vitamin D 2000IU daily. She also maintains daily weight bearing exercise. Last dexa scan completed 2018.  Continue calcium and vitamin D supplement We discussed use of fosamax vs prolia injection. Repeat dexa scan.

## 2021-06-02 NOTE — Assessment & Plan Note (Signed)
Anxiety and Depression managed by Dr. Clovis Pu. Current use of Luvox

## 2021-06-02 NOTE — Progress Notes (Signed)
Subjective:  Patient ID: Julia Holt, female    DOB: 07-08-1960  Age: 60 y.o. MRN: [id number]  CC: Establish Care (TOC-Dr. C/Pt would like to discuss recent bone density test. Pt states she has osteopenia and recently fell and due to a family history or osteoporosis she would like to discuss options to treat this condition.)  HPI  Depression screen [facility] 2/9 06/02/2021 07/31/2020  Decreased Interest 0 1  Down, Depressed, Hopeless 0 1  PHQ - 2 Score 0 2  Altered sleeping 0 0  Tired, decreased energy 0 1  Change in appetite 0 0  Feeling bad or failure about yourself  0 0  Trouble concentrating 0 0  Moving slowly or fidgety/restless 0 0  Suicidal thoughts 0 0  PHQ-9 Score 0 3  Difficult doing work/chores - Somewhat difficult    GAD 7 : Generalized Anxiety Score 06/02/2021  Nervous, Anxious, on Edge 0  Control/stop worrying 0  Worry too much - different things 0  Trouble relaxing 0  Restless 0  Easily annoyed or irritable 0  Afraid - awful might happen 0  Total GAD 7 Score 0   Osteopenia of multiple sites Concerned about height loss over the years 5'10" to 5'7" Fhx of osteoporosis: MGM and mother with osteoporosis. Hx of scoliosis with correction in 2015 per patient. No hx of bone fracture. No tobacco or ETOH use Stopped menstrual cycle at age 80. Current use of calcium 600mg  BID and vitamin D 2000IU daily. She also maintains daily weight bearing exercise. Last dexa scan completed 2018.  Continue calcium and vitamin D supplement We discussed use of fosamax vs prolia injection. Repeat dexa scan.  Adjustment disorder with mixed anxiety and depressed mood Anxiety and Depression managed by Dr. Clovis Pu. Current use of Luvox  Benign essential hypertension HTN managed by Dr. Tamala Julian [facility] endocrinology). Current use of amlodipine and quinapril. BP controlled BP Readings from Last 3 Encounters:  06/02/21 134/72  12/27/20 (!) 144/69  07/31/20 132/72    Diabetes  mellitus due to underlying condition, with long-term current use of insulin (HCC) Insulin pump managed by Dr. Tamala Julian with [facility].  Reviewed past Medical, Social and Family history today.  Outpatient Medications Prior to Visit  Medication Sig Dispense Refill   amLODipine (NORVASC) 10 MG tablet TAKE 1 TABLET BY MOUTH ONCE DAILY 90 tablet 3   aspirin 81 MG chewable tablet Chew 1 tablet (81 mg total) by mouth daily.     Calcium Carbonate-Vitamin D3 600-400 MG-UNIT TABS Take by mouth 2 (two) times daily.     chlorhexidine (PERIDEX) 0.12 % solution SWISH FOR 30 SECONDS AND SPIT 15 MLS 3 TIMES A DAY FOR 2 WEEKS 946 mL 1   Cholecalciferol (VITAMIN D) 50 MCG (2000 UT) tablet Take 2,000 Units by mouth daily.     Continuous Blood Gluc Receiver (DEXCOM G6 RECEIVER) [place] USE AS DIRECTED TO CHECK BLOOD SUGARS     Continuous Blood Gluc Sensor (DEXCOM G6 SENSOR) MISC Apply topically as directed.     Continuous Blood Gluc Sensor (DEXCOM G6 SENSOR) MISC Use one sensor every 10 days as directed 9 each 3   Continuous Blood Gluc Transmit (DEXCOM G6 TRANSMITTER) MISC USE AS DIRECTED FOR CONTINUOUS GLUCOSE MONITORING. REUSE TRANSMITTER FOR 90 DAYS THEN DISCARD AND REPLACE.     Continuous Blood Gluc Transmit (DEXCOM G6 TRANSMITTER) MISC Use as directed every 3 months 1 each 3   glucose blood test strip 1 each by Other route as  needed for other. Use as instructed     HUMALOG 100 UNIT/ML injection Use up to 60 units daily in insulin pump Maximum Daily Dose=60 units 60 mL 2   Insulin Human (INSULIN PUMP) SOLN Inject into the skin. Husband has no idea what kind of insulin she takes. Husband states she is on a insulin pump. Pharmacy has a record of Novolog 60 units twice daily.     INSULIN SYRINGE 1CC/29G 29G X 1/2" 1 ML MISC Use to inject insulin daily 50 each 1   Multiple Vitamins-Minerals (MULTIVITAMIN ADULTS 50+ PO) Take by mouth.     Propranolol HCl (INDERAL PO) Take by mouth.     quinapril  (ACCUPRIL) 20 MG tablet TAKE 1 TABLET (20 MG TOTAL) BY MOUTH 2 TIMES DAILY. 180 tablet 1   spironolactone (ALDACTONE) 50 MG tablet Take 1 tablet (50 mg total) by mouth daily. 90 tablet 1   amLODipine (NORVASC) 10 MG tablet TAKE 1 TABLET BY MOUTH ONCE DAILY 90 tablet 0   levothyroxine (SYNTHROID) 125 MCG tablet Take 1 tablet (125 mcg total) by mouth daily. 30 tablet 3   levothyroxine (SYNTHROID) 150 MCG tablet TAKE 1 TABLET (150 MCG) BY MOUTH DAILY EXCEPT MONDAY, TAKE ADDITIONAL 1/2 TABLET 115 tablet 1   levothyroxine (SYNTHROID, LEVOTHROID) 137 MCG tablet Take 137 mcg by mouth daily before breakfast.     quinapril (ACCUPRIL) 20 MG tablet Take 20 mg by mouth 2 (two) times daily.      quinapril (ACCUPRIL) 20 MG tablet TAKE 1 TABLET BY MOUTH TWICE DAILY 180 tablet 1   quinapril (ACCUPRIL) 20 MG tablet Take 1 tablet (20 mg total) by mouth 2 times daily. 180 tablet 1   spironolactone (ALDACTONE) 25 MG tablet Take 25 mg by mouth daily.     spironolactone (ALDACTONE) 50 MG tablet TAKE 1 TABLET BY MOUTH EVERY DAY 90 tablet 1   fluvoxaMINE (LUVOX) 100 MG tablet TAKE 1 TABLET (100 MG TOTAL) BY MOUTH 2 (TWO) TIMES DAILY. 180 tablet 3   levothyroxine (SYNTHROID) 88 MCG tablet Take 88 mcg by mouth daily.     propranolol (INDERAL) 20 MG tablet TAKE 1 TABLET (20 MG TOTAL) BY MOUTH 2 (TWO) TIMES DAILY AS NEEDED (ANXIETY). 180 tablet 3   amoxicillin (AMOXIL) 500 MG capsule Take 4 capsules by mouth 1 hour prior to dental appointment (Patient not taking: Reported on 06/02/2021) 20 capsule 0   HUMALOG 100 UNIT/ML injection USE UP TO 60 UNITS DAILY IN INSULIN PUMP 60 mL 2   levothyroxine (SYNTHROID) 150 MCG tablet TAKE 1 TABLET BY MOUTH DAILY EXCEPT ON MONDAYS TAKE AN ADDITIONAL 1/2 TABLET 115 tablet 0   spironolactone (ALDACTONE) 50 MG tablet TAKE 1 TABLET BY MOUTH EVERY DAY 90 tablet 1   No facility-administered medications prior to visit.    ROS See HPI  Objective:  BP 134/72 (BP Location: Left Arm, Patient  Position: Sitting, Cuff Size: Large)    Pulse 82    Temp (!) 97.3 F (36.3 C) (Temporal)    Ht 5' 7.75" (1.721 m)    Wt 176 lb (79.8 kg)    SpO2 97%    BMI 26.96 kg/m   Physical Exam Cardiovascular:     Pulses:          Dorsalis pedis pulses are 2+ on the right side and 2+ on the left side.  Musculoskeletal:     Right foot: Normal range of motion. Bunion present. No prominent metatarsal heads.     Left foot:  Normal range of motion. No bunion or prominent metatarsal heads.  Feet:     Right foot:     Protective Sensation: 9 sites tested.  9 sites sensed.     Skin integrity: Skin integrity normal.     Toenail Condition: Right toenails are long.     Left foot:     Protective Sensation: 9 sites tested.  9 sites sensed.     Skin integrity: Skin integrity normal.     Toenail Condition: Left toenails are long.   Assessment & Plan:  This visit occurred during the SARS-CoV-2 public health emergency.  Safety protocols were in place, including screening questions prior to the visit, additional usage of staff PPE, and extensive cleaning of exam room while observing appropriate contact time as indicated for disinfecting solutions.   Eddy was seen today for establish care.  Diagnoses and all orders for this visit:  Osteopenia of multiple sites -     DG Bone Density; Future  Adjustment disorder with mixed anxiety and depressed mood  Benign essential hypertension  Diabetes mellitus due to underlying condition with other specified complication, with long-term current use of insulin ([place])   Problem List Items Addressed This Visit       Cardiovascular and Mediastinum   Benign essential hypertension    HTN managed by Dr. Tamala Julian [facility] endocrinology). Current use of amlodipine and quinapril. BP controlled BP Readings from Last 3 Encounters:  06/02/21 134/72  12/27/20 (!) 144/69  07/31/20 132/72       Relevant Medications   Propranolol HCl (INDERAL PO)     Endocrine   Diabetes  mellitus due to underlying condition, with long-term current use of insulin (HCC)    Insulin pump managed by Dr. Tamala Julian with [facility].        Musculoskeletal and Integument   Osteopenia of multiple sites - Primary    Concerned about height loss over the years 5'10" to 5'7" Fhx of osteoporosis: MGM and mother with osteoporosis. Hx of scoliosis with correction in 2015 per patient. No hx of bone fracture. No tobacco or ETOH use Stopped menstrual cycle at age 45. Current use of calcium 600mg  BID and vitamin D 2000IU daily. She also maintains daily weight bearing exercise. Last dexa scan completed 2018.  Continue calcium and vitamin D supplement We discussed use of fosamax vs prolia injection. Repeat dexa scan.      Relevant Orders   DG Bone Density     Other   Adjustment disorder with mixed anxiety and depressed mood    Anxiety and Depression managed by Dr. Clovis Pu. Current use of Luvox       Follow-up: Return in about 3 months (around 08/31/2021) for CPE (fasting).  Wilfred Lacy, NP

## 2021-06-02 NOTE — Assessment & Plan Note (Signed)
HTN managed by Dr. Tamala Julian [facility] endocrinology). Current use of amlodipine and quinapril. BP controlled BP Readings from Last 3 Encounters:  06/02/21 134/72  12/27/20 (!) 144/69  07/31/20 132/72

## 2021-06-02 NOTE — Assessment & Plan Note (Signed)
Insulin pump managed by Dr. Tamala Julian with [facility].

## 2021-06-02 NOTE — Patient Instructions (Signed)
You will be contacted to schedule appt for bone density Continue weight bearing exercise, calcium and vitamin D supplements.  Sign medical release to get records from previous GYN: Dr. Teryl Lucy.  Osteoporosis Osteoporosis happens when the bones become thin and less dense than normal. Osteoporosis makes bones more brittle and fragile and more likely to break (fracture). Over time, osteoporosis can cause your bones to become so weak that they fracture after a minor fall. Bones in the hip, wrist, and spine are most likely to fracture due to osteoporosis. What are the causes? The exact cause of this condition is not known. What increases the risk? You are more likely to develop this condition if you: Have family members with this condition. Have poor nutrition. Use the following: Steroid medicines, such as prednisone. Anti-seizure medicines. Nicotine or tobacco, such as cigarettes, e-cigarettes, and chewing tobacco. Are female. Are age 35 or older. Are not physically active (are sedentary). Are of European or Asian descent. Have a small body frame. What are the signs or symptoms? A fracture might be the first sign of osteoporosis, especially if the fracture results from a fall or injury that usually would not cause a bone to break. Other signs and symptoms include: Pain in the neck or low back. Stooped posture. Loss of height. How is this diagnosed? This condition may be diagnosed based on: Your medical history. A physical exam. A bone mineral density test, also called a DXA or DEXA test (dual-energy X-ray absorptiometry test). This test uses X-rays to measure the amount of minerals in your bones. How is this treated? This condition may be treated by: Making lifestyle changes, such as: Including foods with more calcium and vitamin D in your diet. Doing weight-bearing and muscle-strengthening exercises. Stopping tobacco use. Limiting alcohol intake. Taking medicine to slow the  process of bone loss or to increase bone density. Taking daily supplements of calcium and vitamin D. Taking hormone replacement medicines, such as estrogen for women and testosterone for men. Monitoring your levels of calcium and vitamin D. The goal of treatment is to strengthen your bones and lower your risk for a fracture. Follow these instructions at home: Eating and drinking Include calcium and vitamin D in your diet. Calcium is important for bone health, and vitamin D helps your body absorb calcium. Good sources of calcium and vitamin D include: Certain fatty fish, such as salmon and tuna. Products that have calcium and vitamin D added to them (are fortified), such as fortified cereals. Egg yolks. Cheese. Liver.  Activity Do exercises as told by your health care provider. Ask your health care provider what exercises and activities are safe for you. You should do: Exercises that make you work against gravity (weight-bearing exercises), such as tai chi, yoga, or walking. Exercises to strengthen muscles, such as lifting weights. Lifestyle Do not drink alcohol if: Your health care provider tells you not to drink. You are pregnant, may be pregnant, or are planning to become pregnant. If you drink alcohol: Limit how much you use to: 0-1 drink a day for women. 0-2 drinks a day for men. Know how much alcohol is in your drink. In the [place] one drink equals one 12 oz bottle of beer (355 mL), one 5 oz glass of wine (148 mL), or one 1 oz glass of hard liquor (44 mL). Do not use any products that contain nicotine or tobacco, such as cigarettes, e-cigarettes, and chewing tobacco. If you need help quitting, ask your health care provider. Preventing  falls Use devices to help you move around (mobility aids) as needed, such as canes, walkers, scooters, or crutches. Keep rooms well-lit and clutter-free. Remove tripping hazards from walkways, including cords and throw rugs. Install grab bars in  bathrooms and safety rails on stairs. Use rubber mats in the bathroom and other areas that are often wet or slippery. Wear closed-toe shoes that fit well and support your feet. Wear shoes that have rubber soles or low heels. Review your medicines with your health care provider. Some medicines can cause dizziness or changes in blood pressure, which can increase your risk of falling. General instructions Take over-the-counter and prescription medicines only as told by your health care provider. Keep all follow-up visits. This is important. Contact a health care provider if: You have never been screened for osteoporosis and you are: A woman who is age 27 or older. A man who is age 73 or older. Get help right away if: You fall or injure yourself. Summary Osteoporosis is thinning and loss of density in your bones. This makes bones more brittle and fragile and more likely to break (fracture),even with minor falls. The goal of treatment is to strengthen your bones and lower your risk for a fracture. Include calcium and vitamin D in your diet. Calcium is important for bone health, and vitamin D helps your body absorb calcium. Talk with your health care provider about screening for osteoporosis if you are a woman who is age 33 or older, or a man who is age 6 or older. This information is not intended to replace advice given to you by your health care provider. Make sure you discuss any questions you have with your health care provider. Document Revised: 11/21/2019 Document Reviewed: 11/21/2019 Elsevier Patient Education  [place].

## 2021-06-08 ENCOUNTER — Other Ambulatory Visit (HOSPITAL_BASED_OUTPATIENT_CLINIC_OR_DEPARTMENT_OTHER): Payer: Self-pay

## 2021-06-08 ENCOUNTER — Other Ambulatory Visit: Payer: Self-pay | Admitting: Psychiatry

## 2021-06-08 DIAGNOSIS — F331 Major depressive disorder, recurrent, moderate: Secondary | ICD-10-CM

## 2021-06-08 DIAGNOSIS — E039 Hypothyroidism, unspecified: Secondary | ICD-10-CM | POA: Diagnosis not present

## 2021-06-08 DIAGNOSIS — F422 Mixed obsessional thoughts and acts: Secondary | ICD-10-CM

## 2021-06-08 MED ORDER — FLUVOXAMINE MALEATE 100 MG PO TABS
ORAL_TABLET | Freq: Two times a day (BID) | ORAL | 3 refills | Status: DC
  Filled 2021-06-08: qty 180, 90d supply, fill #0

## 2021-06-14 ENCOUNTER — Encounter: Payer: Self-pay | Admitting: Nurse Practitioner

## 2021-06-15 ENCOUNTER — Other Ambulatory Visit (HOSPITAL_BASED_OUTPATIENT_CLINIC_OR_DEPARTMENT_OTHER): Payer: Self-pay

## 2021-06-23 ENCOUNTER — Other Ambulatory Visit: Payer: Self-pay

## 2021-06-23 DIAGNOSIS — F422 Mixed obsessional thoughts and acts: Secondary | ICD-10-CM

## 2021-06-23 DIAGNOSIS — F40240 Claustrophobia: Secondary | ICD-10-CM

## 2021-06-23 MED ORDER — PROPRANOLOL HCL 20 MG PO TABS: ORAL_TABLET | ORAL | 3 refills | Status: DC

## 2021-06-24 ENCOUNTER — Other Ambulatory Visit (HOSPITAL_BASED_OUTPATIENT_CLINIC_OR_DEPARTMENT_OTHER): Payer: Self-pay

## 2021-06-24 MED ORDER — PROPRANOLOL HCL 20 MG PO TABS
ORAL_TABLET | ORAL | 3 refills | Status: DC
  Filled 2021-06-24 – 2021-06-25 (×2): qty 180, 90d supply, fill #0

## 2021-06-25 ENCOUNTER — Other Ambulatory Visit (HOSPITAL_BASED_OUTPATIENT_CLINIC_OR_DEPARTMENT_OTHER): Payer: Self-pay

## 2021-06-25 MED FILL — Quinapril HCl Tab 20 MG: ORAL | 45 days supply | Qty: 90 | Fill #0 | Status: AC

## 2021-06-25 MED FILL — Quinapril HCl Tab 20 MG: ORAL | 45 days supply | Qty: 90 | Fill #0 | Status: CN

## 2021-06-30 ENCOUNTER — Other Ambulatory Visit (HOSPITAL_BASED_OUTPATIENT_CLINIC_OR_DEPARTMENT_OTHER): Payer: Self-pay

## 2021-07-01 ENCOUNTER — Encounter: Payer: Self-pay | Admitting: Nurse Practitioner

## 2021-07-05 ENCOUNTER — Telehealth: Payer: Self-pay | Admitting: Nurse Practitioner

## 2021-07-05 DIAGNOSIS — Z1231 Encounter for screening mammogram for malignant neoplasm of breast: Secondary | ICD-10-CM

## 2021-07-05 NOTE — Telephone Encounter (Signed)
See note

## 2021-07-06 ENCOUNTER — Other Ambulatory Visit (HOSPITAL_BASED_OUTPATIENT_CLINIC_OR_DEPARTMENT_OTHER): Payer: Self-pay

## 2021-07-06 NOTE — Telephone Encounter (Signed)
Patient notified and verbalized understanding. 

## 2021-07-07 ENCOUNTER — Other Ambulatory Visit (HOSPITAL_BASED_OUTPATIENT_CLINIC_OR_DEPARTMENT_OTHER): Payer: Self-pay

## 2021-07-07 ENCOUNTER — Encounter: Payer: Self-pay | Admitting: Nurse Practitioner

## 2021-07-07 DIAGNOSIS — I10 Essential (primary) hypertension: Secondary | ICD-10-CM | POA: Diagnosis not present

## 2021-07-07 DIAGNOSIS — E039 Hypothyroidism, unspecified: Secondary | ICD-10-CM | POA: Diagnosis not present

## 2021-07-07 DIAGNOSIS — Z4681 Encounter for fitting and adjustment of insulin pump: Secondary | ICD-10-CM | POA: Diagnosis not present

## 2021-07-07 DIAGNOSIS — Z9641 Presence of insulin pump (external) (internal): Secondary | ICD-10-CM | POA: Diagnosis not present

## 2021-07-07 DIAGNOSIS — E109 Type 1 diabetes mellitus without complications: Secondary | ICD-10-CM | POA: Diagnosis not present

## 2021-07-07 DIAGNOSIS — Z79890 Hormone replacement therapy (postmenopausal): Secondary | ICD-10-CM | POA: Diagnosis not present

## 2021-07-07 MED ORDER — PROPRANOLOL HCL 20 MG PO TABS
20.0000 mg | ORAL_TABLET | Freq: Two times a day (BID) | ORAL | 3 refills | Status: DC | PRN
  Filled 2021-07-07: qty 180, 90d supply, fill #0

## 2021-07-07 MED ORDER — LEVOTHYROXINE SODIUM 100 MCG PO TABS
100.0000 ug | ORAL_TABLET | Freq: Every day | ORAL | 3 refills | Status: DC
  Filled 2021-07-07: qty 90, 90d supply, fill #0

## 2021-07-19 ENCOUNTER — Other Ambulatory Visit (HOSPITAL_BASED_OUTPATIENT_CLINIC_OR_DEPARTMENT_OTHER): Payer: Self-pay

## 2021-07-19 DIAGNOSIS — M8588 Other specified disorders of bone density and structure, other site: Secondary | ICD-10-CM | POA: Diagnosis not present

## 2021-07-19 DIAGNOSIS — Z1231 Encounter for screening mammogram for malignant neoplasm of breast: Secondary | ICD-10-CM | POA: Diagnosis not present

## 2021-07-19 DIAGNOSIS — M8580 Other specified disorders of bone density and structure, unspecified site: Secondary | ICD-10-CM | POA: Diagnosis not present

## 2021-07-19 LAB — HM DEXA SCAN

## 2021-07-19 LAB — HM MAMMOGRAPHY

## 2021-07-20 ENCOUNTER — Other Ambulatory Visit (HOSPITAL_BASED_OUTPATIENT_CLINIC_OR_DEPARTMENT_OTHER): Payer: Self-pay

## 2021-08-02 DIAGNOSIS — E109 Type 1 diabetes mellitus without complications: Secondary | ICD-10-CM | POA: Diagnosis not present

## 2021-08-16 ENCOUNTER — Other Ambulatory Visit (HOSPITAL_BASED_OUTPATIENT_CLINIC_OR_DEPARTMENT_OTHER): Payer: Self-pay

## 2021-08-16 DIAGNOSIS — H16141 Punctate keratitis, right eye: Secondary | ICD-10-CM | POA: Diagnosis not present

## 2021-08-16 MED ORDER — NEOMYCIN-POLYMYXIN-DEXAMETH 0.1 % OP SUSP
OPHTHALMIC | 0 refills | Status: DC
  Filled 2021-08-16: qty 5, 25d supply, fill #0

## 2021-08-17 ENCOUNTER — Other Ambulatory Visit (HOSPITAL_BASED_OUTPATIENT_CLINIC_OR_DEPARTMENT_OTHER): Payer: Self-pay

## 2021-08-19 DIAGNOSIS — Z9641 Presence of insulin pump (external) (internal): Secondary | ICD-10-CM | POA: Diagnosis not present

## 2021-08-19 DIAGNOSIS — Z79890 Hormone replacement therapy (postmenopausal): Secondary | ICD-10-CM | POA: Diagnosis not present

## 2021-08-19 DIAGNOSIS — E109 Type 1 diabetes mellitus without complications: Secondary | ICD-10-CM | POA: Diagnosis not present

## 2021-08-19 DIAGNOSIS — E039 Hypothyroidism, unspecified: Secondary | ICD-10-CM | POA: Diagnosis not present

## 2021-08-19 DIAGNOSIS — I10 Essential (primary) hypertension: Secondary | ICD-10-CM | POA: Diagnosis not present

## 2021-08-23 ENCOUNTER — Other Ambulatory Visit (HOSPITAL_BASED_OUTPATIENT_CLINIC_OR_DEPARTMENT_OTHER): Payer: Self-pay

## 2021-08-25 ENCOUNTER — Other Ambulatory Visit (HOSPITAL_BASED_OUTPATIENT_CLINIC_OR_DEPARTMENT_OTHER): Payer: Self-pay

## 2021-08-26 ENCOUNTER — Other Ambulatory Visit (HOSPITAL_BASED_OUTPATIENT_CLINIC_OR_DEPARTMENT_OTHER): Payer: Self-pay

## 2021-09-09 ENCOUNTER — Other Ambulatory Visit (HOSPITAL_BASED_OUTPATIENT_CLINIC_OR_DEPARTMENT_OTHER): Payer: Self-pay

## 2021-09-09 ENCOUNTER — Other Ambulatory Visit: Payer: Self-pay

## 2021-09-09 ENCOUNTER — Encounter: Payer: Self-pay | Admitting: Psychiatry

## 2021-09-09 ENCOUNTER — Ambulatory Visit (INDEPENDENT_AMBULATORY_CARE_PROVIDER_SITE_OTHER): Payer: 59 | Admitting: Psychiatry

## 2021-09-09 DIAGNOSIS — F330 Major depressive disorder, recurrent, mild: Secondary | ICD-10-CM

## 2021-09-09 DIAGNOSIS — F331 Major depressive disorder, recurrent, moderate: Secondary | ICD-10-CM | POA: Diagnosis not present

## 2021-09-09 DIAGNOSIS — F40240 Claustrophobia: Secondary | ICD-10-CM | POA: Diagnosis not present

## 2021-09-09 DIAGNOSIS — R5383 Other fatigue: Secondary | ICD-10-CM | POA: Diagnosis not present

## 2021-09-09 DIAGNOSIS — F422 Mixed obsessional thoughts and acts: Secondary | ICD-10-CM | POA: Diagnosis not present

## 2021-09-09 MED ORDER — FLUVOXAMINE MALEATE 100 MG PO TABS
100.0000 mg | ORAL_TABLET | Freq: Two times a day (BID) | ORAL | 3 refills | Status: DC
  Filled 2021-09-09: qty 180, 90d supply, fill #0
  Filled 2022-01-23: qty 180, 90d supply, fill #1
  Filled 2022-04-26: qty 57, 28d supply, fill #2
  Filled 2022-04-26 (×2): qty 123, 62d supply, fill #2
  Filled 2022-04-26: qty 57, 28d supply, fill #2

## 2021-09-09 MED ORDER — PROPRANOLOL HCL 20 MG PO TABS
20.0000 mg | ORAL_TABLET | Freq: Two times a day (BID) | ORAL | 3 refills | Status: DC | PRN
  Filled 2021-09-09: qty 180, 90d supply, fill #0
  Filled 2022-04-26: qty 180, 90d supply, fill #1

## 2021-09-09 NOTE — Progress Notes (Signed)
Julia Holt ?[id number] ?04-04-1961 ?61 y.o. ? ?Subjective:  ? ?Patient ID:  Julia Holt is a 61 y.o. (DOB 19-Apr-1961) adult. ? ?Chief Complaint:  ?Chief Complaint  ?Patient presents with  ? Follow-up  ? Depression  ? ? ?Depression ?       Associated symptoms include fatigue.  Associated symptoms include no decreased concentration and no suicidal ideas.  Past medical history includes anxiety.   ?Anxiety ?Symptoms include nervous/anxious behavior. Patient reports no confusion, decreased concentration, dizziness, palpitations or suicidal ideas.  ? ? ?Julia Holt presents to the office today for follow-up of psychiatric diagnoses. ? ?seen March 2020.  Since then she elected to stop Wellbutrin out of concern about potential interaction with propranolol and drug screens at work. ? ?Her mother died 2019/01/15 she had dementia.  At the end she did not recognize her daughter.  It's been really hard.  Misses her. ? ?She called in June asking to increase fluvoxamine to 150 or 200 mg daily. ? ?Started travelling to [place] for work in July and so stressed by the commute.  Exhausted by the end of the day.  Prayed a lot about it.  Feels God told her to resign.  Leaving October 2.  Can't handle the drive anymore.  Anxious driving.  Feels good about the decision.  Julia Holt has driven her some of the time.  Going into job search at [employer].   ? ?seen March 06, 2019.  She was still dealing with grief over the loss of her mother.  We made no med change.  She continued fluvoxamine 200 mg daily as well as propranolol 20 mg twice daily as needed anxiety. ? ?seen January 2021.  No med changes.   ? ?10/30/19  Appt with the following noted: ?No job yet.  Still looking, but expects to get hired today.  Got through mother's day OK.  Managing OK.   ?Taking the propranolol mainly just once daily.  Still struggling over mother's death with holidays.  Ashes in urn with her dad.  All the political and social unrest watching CNN  is depressing.   ?No med changes ? ?04/10/20 appt with the following noted: ?Doing well.  Some stress with new job and using a little more propranolol and it's been helpful for anxiety.  Taking 20 mg propranolol daily. ?Plan: Satisfied with the medications.  No change indicated. ?Continue fluvoxamine 200 mg daily. ?Propranolol 20 mg prn anxiety. ? ?12/09/2020 appointment with the following noted: ?A little more tired lately. ?H low platelets and has been sick.  Stressful.  Up and down is frustrating.  He's tired all the time.  Worries over H.  Wants him to feel better.  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 ?No SE. ?Patient reports grief issues but not depression.    Some anxierty and taking the propranolol daily but it helps.  Overall handling things OK.  Patient denies difficulty with sleep initiation or maintenance, but some negative effects temporarily related to grief and stress work.  I get enough sleep.  Denies appetite disturbance.  Patient reports that energy and motivation have been good.  Patient denies any difficulty with concentration.  Patient denies any suicidal ideation. ?Plan: Satisfied with the medications.  No change indicated. ?Continue fluvoxamine 200 mg daily. ?Propranolol 20 mg prn anxiety. ? ?09/09/21 appt noted: ?On fluvoxamine 100 BID, propranolol 20 mg AM and not weekends.  Sometimes needed at night.   ?Lost 20# which helped  knees.  Thyroid labs mixed up.  From hyper to hypothyroidism.  Meds are starting to work.  Still tired end of the day, but energy is better.  Not irritable.  Less cold intolerant.   ?Lost dog to age.  Got another Shitzu. ?Sleep is good. ?No SE ?Minimal OCD.  Some days more anxiety.  Propranolol helps. ? ?Dexcom DM meter.  Likes it. ? ?Past Psychiatric Medication Trials:  Fluvoxamine, venlafaxine, Wellbutrin 300 helped, paroxetine 40 wt gain, propranolol  ?Her last episode of depression occurred in July 2019 and we increase the Wellbutrin to 300 mg and it  resolved ? ?Review of Systems:  ?Review of Systems  ?Constitutional:  Positive for fatigue.  ?Cardiovascular:  Negative for palpitations.  ?Neurological:  Negative for dizziness, tremors and weakness.  ?Psychiatric/Behavioral:  Negative for agitation, behavioral problems, confusion, decreased concentration, dysphoric mood, hallucinations, self-injury, sleep disturbance and suicidal ideas. The patient is nervous/anxious. The patient is not hyperactive.   ? ?Medications: I have reviewed the patient's current medications. ? ?Current Outpatient Medications  ?Medication Sig Dispense Refill  ? amLODipine (NORVASC) 10 MG tablet TAKE 1 TABLET BY MOUTH ONCE DAILY 90 tablet 3  ? aspirin 81 MG chewable tablet Chew 1 tablet (81 mg total) by mouth daily.    ? Calcium Carbonate-Vitamin D3 600-400 MG-UNIT TABS Take by mouth 2 (two) times daily.    ? chlorhexidine (PERIDEX) 0.12 % solution SWISH FOR 30 SECONDS AND SPIT 15 MLS 3 TIMES A DAY FOR 2 WEEKS 946 mL 1  ? Cholecalciferol (VITAMIN D) 50 MCG (2000 UT) tablet Take 2,000 Units by mouth daily.    ? Continuous Blood Gluc Receiver (DEXCOM G6 RECEIVER) DEVI USE AS DIRECTED TO CHECK BLOOD SUGARS    ? Continuous Blood Gluc Sensor (DEXCOM G6 SENSOR) MISC Apply topically as directed.    ? Continuous Blood Gluc Sensor (DEXCOM G6 SENSOR) MISC Use one sensor every 10 days as directed 9 each 3  ? Continuous Blood Gluc Transmit (DEXCOM G6 TRANSMITTER) MISC USE AS DIRECTED FOR CONTINUOUS GLUCOSE MONITORING. REUSE TRANSMITTER FOR 90 DAYS THEN DISCARD AND REPLACE.    ? Continuous Blood Gluc Transmit (DEXCOM G6 TRANSMITTER) MISC Use as directed every 3 months 1 each 3  ? fluvoxaMINE (LUVOX) 100 MG tablet TAKE 1 TABLET (100 MG TOTAL) BY MOUTH 2 (TWO) TIMES DAILY. 180 tablet 3  ? glucose blood test strip 1 each by Other route as needed for other. Use as instructed    ? HUMALOG 100 UNIT/ML injection Use up to 60 units daily in insulin pump Maximum Daily Dose=60 units 60 mL 2  ? Insulin Human  (INSULIN PUMP) SOLN Inject into the skin. Husband has no idea what kind of insulin she takes. Husband states she is on a insulin pump. Pharmacy has a record of Novolog 60 units twice daily.    ? INSULIN SYRINGE 1CC/29G 29G X 1/2" 1 ML MISC Use to inject insulin daily 50 each 1  ? levothyroxine (SYNTHROID) 88 MCG tablet Take 88 mcg by mouth daily.    ? Multiple Vitamins-Minerals (MULTIVITAMIN ADULTS 50+ PO) Take by mouth.    ? neomycin-polymyxin-dexamethasone (MAXITROL) 0.1 % ophthalmic suspension Instill 1 drop into right eye four times a day 5 mL 0  ? propranolol (INDERAL) 20 MG tablet TAKE 1 TABLET (20 MG TOTAL) BY MOUTH 2 (TWO) TIMES DAILY AS NEEDED FOR ANXIETY 180 tablet 3  ? propranolol (INDERAL) 20 MG tablet Take 1 tablet (20 mg total) by mouth 2 (two) times daily  as needed for anxiety. 180 tablet 3  ? Propranolol HCl (INDERAL PO) Take by mouth.    ? quinapril (ACCUPRIL) 20 MG tablet TAKE 1 TABLET (20 MG TOTAL) BY MOUTH 2 TIMES DAILY. 180 tablet 1  ? spironolactone (ALDACTONE) 50 MG tablet Take 1 tablet (50 mg total) by mouth daily. 90 tablet 1  ? ?No current facility-administered medications for this visit.  ? ? ?Medication Side Effects: None ? ?Allergies:  ?Allergies  ?Allergen Reactions  ? Latex Other (See Comments)  ? Sulfa Antibiotics Hives and Itching  ? ? ?Past Medical History:  ?Diagnosis Date  ? Anxiety   ? Depression   ? Diabetes mellitus without complication ([place])   ? Hypertension   ? Thyroid disease   ? ? ?Family History  ?Problem Relation Age of Onset  ? Hypertension Mother   ? Hyperlipidemia Mother   ? Dementia Mother   ? Alzheimer's disease Father   ? ? ?Social History  ? ?Socioeconomic History  ? Marital status: Married  ?  Spouse name: Not on file  ? Number of children: Not on file  ? Years of education: Not on file  ? Highest education level: Not on file  ?Occupational History  ? Not on file  ?Tobacco Use  ? Smoking status: Never  ? Smokeless tobacco: Never  ?Vaping Use  ? Vaping Use: Never  used  ?Substance and Sexual Activity  ? Alcohol use: No  ? Drug use: No  ? Sexual activity: Yes  ?Other Topics Concern  ? Not on file  ?Social History Narrative  ? Not on file  ? ?Social Determinants of He

## 2021-09-10 ENCOUNTER — Encounter: Payer: Self-pay | Admitting: Nurse Practitioner

## 2021-09-10 ENCOUNTER — Other Ambulatory Visit (HOSPITAL_BASED_OUTPATIENT_CLINIC_OR_DEPARTMENT_OTHER): Payer: Self-pay

## 2021-09-10 ENCOUNTER — Ambulatory Visit (INDEPENDENT_AMBULATORY_CARE_PROVIDER_SITE_OTHER): Payer: 59 | Admitting: Nurse Practitioner

## 2021-09-10 VITALS — BP 108/70 | HR 66 | Temp 96.7°F | Ht 67.75 in | Wt 168.6 lb

## 2021-09-10 DIAGNOSIS — Z0001 Encounter for general adult medical examination with abnormal findings: Secondary | ICD-10-CM

## 2021-09-10 DIAGNOSIS — I10 Essential (primary) hypertension: Secondary | ICD-10-CM

## 2021-09-10 DIAGNOSIS — J441 Chronic obstructive pulmonary disease with (acute) exacerbation: Secondary | ICD-10-CM | POA: Insufficient documentation

## 2021-09-10 DIAGNOSIS — E1069 Type 1 diabetes mellitus with other specified complication: Secondary | ICD-10-CM

## 2021-09-10 DIAGNOSIS — E782 Mixed hyperlipidemia: Secondary | ICD-10-CM

## 2021-09-10 DIAGNOSIS — Z23 Encounter for immunization: Secondary | ICD-10-CM | POA: Diagnosis not present

## 2021-09-10 LAB — COMPREHENSIVE METABOLIC PANEL
ALT: 18 U/L (ref 0–35)
AST: 24 U/L (ref 0–37)
Albumin: 4.7 g/dL (ref 3.5–5.2)
Alkaline Phosphatase: 80 U/L (ref 39–117)
BUN: 16 mg/dL (ref 6–23)
CO2: 31 mEq/L (ref 19–32)
Calcium: 10.1 mg/dL (ref 8.4–10.5)
Chloride: 99 mEq/L (ref 96–112)
Creatinine, Ser: 0.81 mg/dL (ref 0.40–1.20)
GFR: 79.55 mL/min (ref >60.00)
Glucose, Bld: 103 mg/dL — ABNORMAL HIGH (ref 70–99)
Potassium: 4.2 mEq/L (ref 3.5–5.1)
Sodium: 135 mEq/L (ref 135–145)
Total Bilirubin: 0.5 mg/dL (ref 0.2–1.2)
Total Protein: 7.4 g/dL (ref 6.0–8.3)

## 2021-09-10 LAB — LIPID PANEL
Cholesterol: 173 mg/dL (ref 0–200)
HDL: 70.7 mg/dL (ref >39.00)
LDL Cholesterol: 93 mg/dL (ref 0–99)
NonHDL: 101.9
Total CHOL/HDL Ratio: 2
Triglycerides: 43 mg/dL (ref 0.0–149.0)
VLDL: 8.6 mg/dL (ref 0.0–40.0)

## 2021-09-10 LAB — CBC
HCT: 43.8 % (ref 36.0–46.0)
Hemoglobin: 14.8 g/dL (ref 12.0–15.0)
MCHC: 33.8 g/dL (ref 30.0–36.0)
MCV: 95.2 fl (ref 78.0–100.0)
Platelets: 241 10*3/uL (ref 150.0–400.0)
RBC: 4.6 Mil/uL (ref 3.87–5.11)
RDW: 12.8 % (ref 11.5–15.5)
WBC: 6.5 10*3/uL (ref 4.0–10.5)

## 2021-09-10 MED ORDER — AMLODIPINE BESYLATE 10 MG PO TABS
ORAL_TABLET | Freq: Every day | ORAL | 3 refills | Status: DC
  Filled 2021-09-10 – 2021-11-24 (×2): qty 90, 90d supply, fill #0
  Filled 2022-04-10 – 2022-05-02 (×3): qty 90, 90d supply, fill #1
  Filled 2022-09-02: qty 90, 90d supply, fill #2

## 2021-09-10 NOTE — Assessment & Plan Note (Signed)
Propanolol prn and luvox daily x 4yr ?

## 2021-09-10 NOTE — Assessment & Plan Note (Signed)
resolved 

## 2021-09-10 NOTE — Assessment & Plan Note (Addendum)
Managed by Dr. Tamala Julian (endocrinology) ?hgbA1c at 5.8% (06/2021) ?BP at goal ?LDL at goal without statin at this time. ?Up to date with eye exam and dental cleaning  ?No neuropathy ?Up to date with urine microalbumin ?

## 2021-09-10 NOTE — Progress Notes (Signed)
? ?Complete physical exam ? ?Patient: Julia Holt   DOB: 25-Sep-1960   61 y.o. Adult  MRN: [id number] ?Visit Date: 09/10/2021 ? ?Subjective:  ?  ?Chief Complaint  ?Patient presents with  ? Annual Exam  ?  Physical-No breast or pap exam needed.  ?Pt is fasting.  ?Tdap vaccine given today   ? ?Julia Holt is a 61 y.o. adult who presents today for a complete physical exam. NOTNAMED Julia Holt reports consuming a  Home exercise routine includes calisthenics, stretching, and treadmill. Owens Loffler generally feels well. HALCYON HECK reports sleeping well. TARI LECOUNT does have additional problems to discuss today.  ?Vision:Yes ?Dental:Yes ? ?Most recent fall risk assessment: ? ?  06/02/2021  ?  1:40 PM  ?Fall Risk   ?Falls in the past year? 1  ?Number falls in past yr: 0  ?Injury with Fall? 1  ?Risk for fall due to : History of fall(s)  ?Follow up Falls evaluation completed  ? ?Most recent depression screenings: ? ?  09/10/2021  ?  8:50 AM 06/02/2021  ?  2:23 PM  ?PHQ 2/9 Scores  ?PHQ - 2 Score 2 0  ?PHQ- 9 Score 3 0  ? ?HPI  ?COPD exacerbation ([place]) ?resolved ? ?Diabetes mellitus due to underlying condition with other specified complication, with long-term current use of insulin ([place]) ?Managed by Dr. Tamala Julian (endocrinology) ?hgbA1c at 5.8% (06/2021) ?BP at goal ?LDL at goal without statin at this time. ?Up to date with eye exam and dental cleaning  ?No neuropathy ?Up to date with urine microalbumin ? ?Mixed diabetic hyperlipidemia associated with type 1 diabetes mellitus ([place]) ?Repeat lipid panel ?No statin use at this time ? ?Adjustment disorder with mixed anxiety and depressed mood ?Propanolol prn and luvox daily x 49yr ? ?Past Medical History:  ?Diagnosis Date  ? Anxiety   ? Depression   ? [place]   ? Hypertension   ? Thyroid disease   ? ?Past Surgical History:  ?Procedure Laterality Date  ? BACK SURGERY    ? BUNIONECTOMY Left   ? ?Social History  ? ?Socioeconomic History  ? Marital  status: Married  ?  Spouse name: Not on file  ? Number of children: Not on file  ? Years of education: Not on file  ? Highest education level: Not on file  ?Occupational History  ? Not on file  ?Tobacco Use  ? Smoking status: Never  ? Smokeless tobacco: Never  ?Vaping Use  ? Vaping Use: Never used  ?Substance and Sexual Activity  ? Alcohol use: No  ? Drug use: No  ? Sexual activity: Yes  ?Other Topics Concern  ? Not on file  ?Social History Narrative  ? Not on file  ? ?Social Determinants of Health  ? ?Financial Resource Strain: Not on file  ?Food Insecurity: Not on file  ?Transportation Needs: Not on file  ?Physical Activity: Not on file  ?Stress: Not on file  ?Social Connections: Not on file  ?Intimate Partner Violence: Not on file  ? ?Family Status  ?Relation Name Status  ? Mother  Alive  ? Father  Deceased  ? ?Family History  ?Problem Relation Age of Onset  ? Hypertension Mother   ? Hyperlipidemia Mother   ? Dementia Mother   ? Alzheimer's disease Father   ? ?Allergies  ?Allergen Reactions  ? Latex Other (See Comments)  ? Sulfa Antibiotics Hives and Itching  ?  ?Patient Care Team: ?NWilfred Lacy  Lum, NP as PCP - General (Internal Medicine)  ? ?Medications: ?Outpatient Medications Prior to Visit  ?Medication Sig  ? aspirin 81 MG chewable tablet Chew 1 tablet (81 mg total) by mouth daily.  ? Calcium Carbonate-Vitamin D3 600-400 MG-UNIT TABS Take by mouth 2 (two) times daily.  ? chlorhexidine (PERIDEX) 0.12 % solution SWISH FOR 30 SECONDS AND SPIT 15 MLS 3 TIMES A DAY FOR 2 WEEKS  ? Cholecalciferol (VITAMIN D) 50 MCG (2000 UT) tablet Take 2,000 Units by mouth daily.  ? Continuous Blood Gluc Receiver (DEXCOM G6 RECEIVER) DEVI USE AS DIRECTED TO CHECK BLOOD SUGARS  ? Continuous Blood Gluc Sensor (DEXCOM G6 SENSOR) MISC Apply topically as directed.  ? Continuous Blood Gluc Sensor (DEXCOM G6 SENSOR) MISC Use one sensor every 10 days as directed  ? Continuous Blood Gluc Transmit (DEXCOM G6 TRANSMITTER) MISC USE AS  DIRECTED FOR CONTINUOUS GLUCOSE MONITORING. REUSE TRANSMITTER FOR 90 DAYS THEN DISCARD AND REPLACE.  ? Continuous Blood Gluc Transmit (DEXCOM G6 TRANSMITTER) MISC Use as directed every 3 months  ? fluvoxaMINE (LUVOX) 100 MG tablet Take 1 tablet (100 mg total) by mouth 2 (two) times daily.  ? glucose blood test strip 1 each by Other route as needed for other. Use as instructed  ? HUMALOG 100 UNIT/ML injection Use up to 60 units daily in insulin pump Maximum Daily Dose=60 units  ? Insulin Human (INSULIN PUMP) SOLN Inject into the skin. Husband has no idea what kind of insulin she takes. Husband states she is on a insulin pump. Pharmacy has a record of Novolog 60 units twice daily.  ? INSULIN SYRINGE 1CC/29G 29G X 1/2" 1 ML MISC Use to inject insulin daily  ? levothyroxine (SYNTHROID) 88 MCG tablet Take 88 mcg by mouth daily.  ? Multiple Vitamins-Minerals (MULTIVITAMIN ADULTS 50+ PO) Take by mouth.  ? neomycin-polymyxin-dexamethasone (MAXITROL) 0.1 % ophthalmic suspension Instill 1 drop into right eye four times a day  ? propranolol (INDERAL) 20 MG tablet Take 1 tablet (20 mg total) by mouth 2 (two) times daily as needed for anxiety.  ? quinapril (ACCUPRIL) 20 MG tablet TAKE 1 TABLET (20 MG TOTAL) BY MOUTH 2 TIMES DAILY.  ? spironolactone (ALDACTONE) 50 MG tablet Take 1 tablet (50 mg total) by mouth daily.  ? [DISCONTINUED] amLODipine (NORVASC) 10 MG tablet TAKE 1 TABLET BY MOUTH ONCE DAILY  ? [DISCONTINUED] propranolol (INDERAL) 20 MG tablet TAKE 1 TABLET (20 MG TOTAL) BY MOUTH 2 (TWO) TIMES DAILY AS NEEDED FOR ANXIETY  ? [DISCONTINUED] Propranolol HCl (INDERAL PO) Take by mouth.  ? ?No facility-administered medications prior to visit.  ? ? ?Review of Systems  ?Constitutional:  Negative for fever.  ?HENT:  Negative for congestion and sore throat.   ?Eyes:   ?     Negative for visual changes  ?Respiratory:  Negative for cough and shortness of breath.   ?Cardiovascular:  Negative for chest pain, palpitations and leg  swelling.  ?Gastrointestinal:  Negative for blood in stool, constipation and diarrhea.  ?Genitourinary:  Negative for dysuria, frequency and urgency.  ?Musculoskeletal:  Negative for myalgias.  ?Skin:  Negative for rash.  ?Neurological:  Negative for dizziness and headaches.  ?Hematological:  Does not bruise/bleed easily.  ?Psychiatric/Behavioral:  Negative for suicidal ideas. The patient is not nervous/anxious.   ? ? ?   ?Objective:  ?BP 108/70 (BP Location: Left Arm, Patient Position: Sitting, Cuff Size: Large)   Pulse 66   Temp (!) 96.7 ?F (35.9 ?C) (Temporal)  Ht 5' 7.75" (1.721 m)   Wt 168 lb 9.6 oz (76.5 kg)   SpO2 97%   BMI 25.83 kg/m?  ?  ? ? ? ?Physical Exam ?Vitals and nursing note reviewed.  ?Constitutional:   ?   General: PHUONG HILLARY is not in acute distress. ?HENT:  ?   Right Ear: Tympanic membrane, ear canal and external ear normal.  ?   Left Ear: Tympanic membrane, ear canal and external ear normal.  ?Eyes:  ?   General: No scleral icterus. ?   Extraocular Movements: Extraocular movements intact.  ?   Conjunctiva/sclera: Conjunctivae normal.  ?Cardiovascular:  ?   Rate and Rhythm: Normal rate and regular rhythm.  ?   Pulses: Normal pulses.  ?   Heart sounds: Normal heart sounds.  ?Pulmonary:  ?   Effort: Pulmonary effort is normal. No respiratory distress.  ?   Breath sounds: Normal breath sounds.  ?Abdominal:  ?   General: Bowel sounds are normal. There is no distension.  ?   Palpations: Abdomen is soft.  ?Musculoskeletal:     ?   General: Normal range of motion.  ?   Cervical back: Normal range of motion and neck supple.  ?   Right lower leg: No edema.  ?   Left lower leg: No edema.  ?Lymphadenopathy:  ?   Cervical: No cervical adenopathy.  ?Skin: ?   General: Skin is warm and dry.  ?Neurological:  ?   Mental Status: LIYAH HIGHAM is alert and oriented to person, place, and time.  ?Psychiatric:     ?   Mood and Affect: Mood normal.     ?   Behavior: Behavior normal.     ?   Thought Content:  Thought content normal.  ?  ? ?Results for orders placed or performed in visit on 09/10/21  ?Lipid panel  ?Result Value Ref Range  ? Cholesterol 173 0 - 200 mg/dL  ? Triglycerides 43.0 0.0 - 149.0 mg/dL  ? HDL

## 2021-09-10 NOTE — Assessment & Plan Note (Signed)
Repeat lipid panel ?No statin use at this time ?

## 2021-09-10 NOTE — Patient Instructions (Signed)
Go to lab for blood draw ? ?Preventive Care 60-60 Years Old, Female ?Preventive care refers to lifestyle choices and visits with your health care provider that can promote health and wellness. Preventive care visits are also called wellness exams. ?What can I expect for my preventive care visit? ?Counseling ?Your health care provider may ask you questions about your: ?Medical history, including: ?Past medical problems. ?Family medical history. ?Pregnancy history. ?Current health, including: ?Menstrual cycle. ?Method of birth control. ?Emotional well-being. ?Home life and relationship well-being. ?Sexual activity and sexual health. ?Lifestyle, including: ?Alcohol, nicotine or tobacco, and drug use. ?Access to firearms. ?Diet, exercise, and sleep habits. ?Work and work Statistician. ?Sunscreen use. ?Safety issues such as seatbelt and bike helmet use. ?Physical exam ?Your health care provider will check your: ?Height and weight. These may be used to calculate your BMI (body mass index). BMI is a measurement that tells if you are at a healthy weight. ?Waist circumference. This measures the distance around your waistline. This measurement also tells if you are at a healthy weight and may help predict your risk of certain diseases, such as type 2 diabetes and high blood pressure. ?Heart rate and blood pressure. ?Body temperature. ?Skin for abnormal spots. ?What immunizations do I need? ?Vaccines are usually given at various ages, according to a schedule. Your health care provider will recommend vaccines for you based on your age, medical history, and lifestyle or other factors, such as travel or where you work. ?What tests do I need? ?Screening ?Your health care provider may recommend screening tests for certain conditions. This may include: ?Lipid and cholesterol levels. ?Diabetes screening. This is done by checking your blood sugar (glucose) after you have not eaten for a while (fasting). ?Pelvic exam and Pap  test. ?Hepatitis B test. ?Hepatitis C test. ?HIV (human immunodeficiency virus) test. ?STI (sexually transmitted infection) testing, if you are at risk. ?Lung cancer screening. ?Colorectal cancer screening. ?Mammogram. Talk with your health care provider about when you should start having regular mammograms. This may depend on whether you have a family history of breast cancer. ?BRCA-related cancer screening. This may be done if you have a family history of breast, ovarian, tubal, or peritoneal cancers. ?Bone density scan. This is done to screen for osteoporosis. ?Talk with your health care provider about your test results, treatment options, and if necessary, the need for more tests. ?Follow these instructions at home: ?Eating and drinking ? ?Eat a diet that includes fresh fruits and vegetables, whole grains, lean protein, and low-fat dairy products. ?Take vitamin and mineral supplements as recommended by your health care provider. ?Do not drink alcohol if: ?Your health care provider tells you not to drink. ?You are pregnant, may be pregnant, or are planning to become pregnant. ?If you drink alcohol: ?Limit how much you have to 0-1 drink a day. ?Know how much alcohol is in your drink. In the [place] one drink equals one 12 oz bottle of beer (355 mL), one 5 oz glass of wine (148 mL), or one 1? oz glass of hard liquor (44 mL). ?Lifestyle ?Brush your teeth every morning and night with fluoride toothpaste. Floss one time each day. ?Exercise for at least 30 minutes 5 or more days each week. ?Do not use any products that contain nicotine or tobacco. These products include cigarettes, chewing tobacco, and vaping devices, such as e-cigarettes. If you need help quitting, ask your health care provider. ?Do not use drugs. ?If you are sexually active, practice safe sex. Use a condom  or other form of protection to prevent STIs. ?If you do not wish to become pregnant, use a form of birth control. If you plan to become pregnant,  see your health care provider for a prepregnancy visit. ?Take aspirin only as told by your health care provider. Make sure that you understand how much to take and what form to take. Work with your health care provider to find out whether it is safe and beneficial for you to take aspirin daily. ?Find healthy ways to manage stress, such as: ?Meditation, yoga, or listening to music. ?Journaling. ?Talking to a trusted person. ?Spending time with friends and family. ?Minimize exposure to UV radiation to reduce your risk of skin cancer. ?Safety ?Always wear your seat belt while driving or riding in a vehicle. ?Do not drive: ?If you have been drinking alcohol. Do not ride with someone who has been drinking. ?When you are tired or distracted. ?While texting. ?If you have been using any mind-altering substances or drugs. ?Wear a helmet and other protective equipment during sports activities. ?If you have firearms in your house, make sure you follow all gun safety procedures. ?Seek help if you have been physically or sexually abused. ?What's next? ?Visit your health care provider once a year for an annual wellness visit. ?Ask your health care provider how often you should have your eyes and teeth checked. ?Stay up to date on all vaccines. ?This information is not intended to replace advice given to you by your health care provider. Make sure you discuss any questions you have with your health care provider. ?Document Revised: 12/02/2020 Document Reviewed: 12/02/2020 ?Elsevier Patient Education ? [place]. ? ?

## 2021-09-26 ENCOUNTER — Other Ambulatory Visit: Payer: Self-pay

## 2021-09-27 ENCOUNTER — Other Ambulatory Visit (HOSPITAL_BASED_OUTPATIENT_CLINIC_OR_DEPARTMENT_OTHER): Payer: Self-pay

## 2021-09-27 MED ORDER — LEVOTHYROXINE SODIUM 100 MCG PO TABS
100.0000 ug | ORAL_TABLET | Freq: Every day | ORAL | 3 refills | Status: DC
  Filled 2021-09-27: qty 30, 30d supply, fill #0
  Filled 2021-10-24: qty 30, 30d supply, fill #1
  Filled 2021-11-24: qty 30, 30d supply, fill #2
  Filled 2021-12-27: qty 30, 30d supply, fill #3

## 2021-09-27 MED ORDER — QUINAPRIL HCL 20 MG PO TABS
ORAL_TABLET | ORAL | 3 refills | Status: DC
  Filled 2021-09-27: qty 180, 90d supply, fill #0
  Filled 2022-01-23: qty 180, fill #0

## 2021-09-29 ENCOUNTER — Other Ambulatory Visit: Payer: Self-pay

## 2021-09-29 ENCOUNTER — Other Ambulatory Visit (HOSPITAL_BASED_OUTPATIENT_CLINIC_OR_DEPARTMENT_OTHER): Payer: Self-pay

## 2021-09-29 MED ORDER — QUINAPRIL HCL 20 MG PO TABS
ORAL_TABLET | ORAL | 1 refills | Status: DC
  Filled 2021-09-29: qty 180, 90d supply, fill #0

## 2021-09-30 ENCOUNTER — Other Ambulatory Visit (HOSPITAL_BASED_OUTPATIENT_CLINIC_OR_DEPARTMENT_OTHER): Payer: Self-pay

## 2021-09-30 MED ORDER — LISINOPRIL 40 MG PO TABS
40.0000 mg | ORAL_TABLET | Freq: Every day | ORAL | 3 refills | Status: DC
  Filled 2021-09-30: qty 90, 90d supply, fill #0
  Filled 2022-01-23: qty 90, 90d supply, fill #1
  Filled 2022-04-26: qty 90, 90d supply, fill #2
  Filled 2022-07-25: qty 90, 90d supply, fill #3

## 2021-10-12 ENCOUNTER — Other Ambulatory Visit (HOSPITAL_BASED_OUTPATIENT_CLINIC_OR_DEPARTMENT_OTHER): Payer: Self-pay

## 2021-10-17 ENCOUNTER — Other Ambulatory Visit (HOSPITAL_BASED_OUTPATIENT_CLINIC_OR_DEPARTMENT_OTHER): Payer: Self-pay

## 2021-10-18 ENCOUNTER — Other Ambulatory Visit (HOSPITAL_BASED_OUTPATIENT_CLINIC_OR_DEPARTMENT_OTHER): Payer: Self-pay

## 2021-10-18 MED ORDER — SPIRONOLACTONE 50 MG PO TABS
50.0000 mg | ORAL_TABLET | Freq: Every day | ORAL | 1 refills | Status: DC
  Filled 2021-10-18: qty 90, 90d supply, fill #0
  Filled 2022-01-23: qty 90, 90d supply, fill #1

## 2021-10-19 ENCOUNTER — Other Ambulatory Visit (HOSPITAL_BASED_OUTPATIENT_CLINIC_OR_DEPARTMENT_OTHER): Payer: Self-pay

## 2021-10-19 MED ORDER — HUMALOG 100 UNIT/ML IJ SOLN
INTRAMUSCULAR | 2 refills | Status: DC
  Filled 2021-10-19: qty 50, 83d supply, fill #0
  Filled 2022-01-23: qty 50, 83d supply, fill #1
  Filled 2022-04-17: qty 50, 83d supply, fill #2
  Filled 2022-07-03: qty 50, 83d supply, fill #3

## 2021-10-21 ENCOUNTER — Other Ambulatory Visit (HOSPITAL_BASED_OUTPATIENT_CLINIC_OR_DEPARTMENT_OTHER): Payer: Self-pay

## 2021-10-22 ENCOUNTER — Other Ambulatory Visit (HOSPITAL_BASED_OUTPATIENT_CLINIC_OR_DEPARTMENT_OTHER): Payer: Self-pay

## 2021-10-25 ENCOUNTER — Other Ambulatory Visit (HOSPITAL_BASED_OUTPATIENT_CLINIC_OR_DEPARTMENT_OTHER): Payer: Self-pay

## 2021-10-26 ENCOUNTER — Other Ambulatory Visit (HOSPITAL_BASED_OUTPATIENT_CLINIC_OR_DEPARTMENT_OTHER): Payer: Self-pay

## 2021-11-01 ENCOUNTER — Other Ambulatory Visit (HOSPITAL_BASED_OUTPATIENT_CLINIC_OR_DEPARTMENT_OTHER): Payer: Self-pay

## 2021-11-03 DIAGNOSIS — E109 Type 1 diabetes mellitus without complications: Secondary | ICD-10-CM | POA: Diagnosis not present

## 2021-11-24 ENCOUNTER — Other Ambulatory Visit: Payer: Self-pay

## 2021-11-25 ENCOUNTER — Other Ambulatory Visit (HOSPITAL_BASED_OUTPATIENT_CLINIC_OR_DEPARTMENT_OTHER): Payer: Self-pay

## 2021-11-26 ENCOUNTER — Other Ambulatory Visit (HOSPITAL_BASED_OUTPATIENT_CLINIC_OR_DEPARTMENT_OTHER): Payer: Self-pay

## 2021-11-26 MED ORDER — DEXCOM G6 SENSOR MISC
0 refills | Status: DC
  Filled 2021-11-26: qty 9, 90d supply, fill #0

## 2021-11-26 MED ORDER — AMLODIPINE BESYLATE 10 MG PO TABS
10.0000 mg | ORAL_TABLET | Freq: Every day | ORAL | 0 refills | Status: DC
Start: 2021-11-26 — End: 2022-03-17
  Filled 2021-11-26 – 2022-02-17 (×3): qty 90, 90d supply, fill #0

## 2021-11-26 MED ORDER — LEVOTHYROXINE SODIUM 100 MCG PO TABS
100.0000 ug | ORAL_TABLET | Freq: Every day | ORAL | 0 refills | Status: DC
  Filled 2021-11-26 – 2021-12-24 (×2): qty 30, 30d supply, fill #0

## 2021-12-14 ENCOUNTER — Encounter (INDEPENDENT_AMBULATORY_CARE_PROVIDER_SITE_OTHER): Payer: Self-pay | Admitting: Ophthalmology

## 2021-12-14 ENCOUNTER — Encounter (INDEPENDENT_AMBULATORY_CARE_PROVIDER_SITE_OTHER): Payer: 59 | Admitting: Ophthalmology

## 2021-12-14 ENCOUNTER — Ambulatory Visit (INDEPENDENT_AMBULATORY_CARE_PROVIDER_SITE_OTHER): Payer: 59 | Admitting: Ophthalmology

## 2021-12-14 DIAGNOSIS — Z794 Long term (current) use of insulin: Secondary | ICD-10-CM | POA: Diagnosis not present

## 2021-12-14 DIAGNOSIS — H43822 Vitreomacular adhesion, left eye: Secondary | ICD-10-CM | POA: Diagnosis not present

## 2021-12-14 DIAGNOSIS — H43821 Vitreomacular adhesion, right eye: Secondary | ICD-10-CM

## 2021-12-14 DIAGNOSIS — E0869 Diabetes mellitus due to underlying condition with other specified complication: Secondary | ICD-10-CM

## 2021-12-14 DIAGNOSIS — H2513 Age-related nuclear cataract, bilateral: Secondary | ICD-10-CM | POA: Diagnosis not present

## 2021-12-14 NOTE — Assessment & Plan Note (Signed)
Physiologic OD

## 2021-12-14 NOTE — Assessment & Plan Note (Signed)
Age-appropriate good vision observe

## 2021-12-23 DIAGNOSIS — E109 Type 1 diabetes mellitus without complications: Secondary | ICD-10-CM | POA: Diagnosis not present

## 2021-12-23 DIAGNOSIS — Z9641 Presence of insulin pump (external) (internal): Secondary | ICD-10-CM | POA: Diagnosis not present

## 2021-12-23 DIAGNOSIS — Z79890 Hormone replacement therapy (postmenopausal): Secondary | ICD-10-CM | POA: Diagnosis not present

## 2021-12-23 DIAGNOSIS — I10 Essential (primary) hypertension: Secondary | ICD-10-CM | POA: Diagnosis not present

## 2021-12-23 DIAGNOSIS — E039 Hypothyroidism, unspecified: Secondary | ICD-10-CM | POA: Diagnosis not present

## 2021-12-24 ENCOUNTER — Other Ambulatory Visit (HOSPITAL_BASED_OUTPATIENT_CLINIC_OR_DEPARTMENT_OTHER): Payer: Self-pay

## 2021-12-27 ENCOUNTER — Other Ambulatory Visit (HOSPITAL_BASED_OUTPATIENT_CLINIC_OR_DEPARTMENT_OTHER): Payer: Self-pay

## 2021-12-27 ENCOUNTER — Other Ambulatory Visit: Payer: Self-pay

## 2021-12-28 ENCOUNTER — Other Ambulatory Visit (HOSPITAL_BASED_OUTPATIENT_CLINIC_OR_DEPARTMENT_OTHER): Payer: Self-pay

## 2021-12-29 ENCOUNTER — Other Ambulatory Visit (HOSPITAL_BASED_OUTPATIENT_CLINIC_OR_DEPARTMENT_OTHER): Payer: Self-pay

## 2021-12-29 MED ORDER — DEXCOM G6 TRANSMITTER MISC
2 refills | Status: DC
  Filled 2021-12-29: qty 1, 90d supply, fill #0

## 2021-12-29 MED ORDER — LEVOTHYROXINE SODIUM 100 MCG PO TABS
100.0000 ug | ORAL_TABLET | Freq: Every day | ORAL | 1 refills | Status: DC
  Filled 2022-01-23: qty 90, 90d supply, fill #0

## 2022-01-24 ENCOUNTER — Other Ambulatory Visit (HOSPITAL_BASED_OUTPATIENT_CLINIC_OR_DEPARTMENT_OTHER): Payer: Self-pay

## 2022-01-25 ENCOUNTER — Other Ambulatory Visit (HOSPITAL_BASED_OUTPATIENT_CLINIC_OR_DEPARTMENT_OTHER): Payer: Self-pay

## 2022-02-04 DIAGNOSIS — E109 Type 1 diabetes mellitus without complications: Secondary | ICD-10-CM | POA: Diagnosis not present

## 2022-02-17 ENCOUNTER — Other Ambulatory Visit (HOSPITAL_BASED_OUTPATIENT_CLINIC_OR_DEPARTMENT_OTHER): Payer: Self-pay

## 2022-02-18 ENCOUNTER — Other Ambulatory Visit (HOSPITAL_BASED_OUTPATIENT_CLINIC_OR_DEPARTMENT_OTHER): Payer: Self-pay

## 2022-02-22 ENCOUNTER — Other Ambulatory Visit (HOSPITAL_BASED_OUTPATIENT_CLINIC_OR_DEPARTMENT_OTHER): Payer: Self-pay

## 2022-02-22 MED ORDER — DEXCOM G6 SENSOR MISC
0 refills | Status: DC
  Filled 2022-02-22: qty 9, 90d supply, fill #0

## 2022-03-17 ENCOUNTER — Encounter: Payer: Self-pay | Admitting: Nurse Practitioner

## 2022-03-17 ENCOUNTER — Ambulatory Visit: Payer: 59 | Admitting: Nurse Practitioner

## 2022-03-17 ENCOUNTER — Other Ambulatory Visit (HOSPITAL_BASED_OUTPATIENT_CLINIC_OR_DEPARTMENT_OTHER): Payer: Self-pay

## 2022-03-17 VITALS — BP 128/78 | HR 60 | Temp 97.8°F | Ht 67.75 in | Wt 173.0 lb

## 2022-03-17 DIAGNOSIS — I10 Essential (primary) hypertension: Secondary | ICD-10-CM

## 2022-03-17 DIAGNOSIS — Z23 Encounter for immunization: Secondary | ICD-10-CM | POA: Diagnosis not present

## 2022-03-17 DIAGNOSIS — E782 Mixed hyperlipidemia: Secondary | ICD-10-CM | POA: Diagnosis not present

## 2022-03-17 DIAGNOSIS — M8589 Other specified disorders of bone density and structure, multiple sites: Secondary | ICD-10-CM

## 2022-03-17 DIAGNOSIS — E1069 Type 1 diabetes mellitus with other specified complication: Secondary | ICD-10-CM

## 2022-03-17 LAB — LIPID PANEL
Cholesterol: 171 mg/dL (ref 0–200)
HDL: 74 mg/dL (ref >39.00)
LDL Cholesterol: 88 mg/dL (ref 0–99)
NonHDL: 96.52
Total CHOL/HDL Ratio: 2
Triglycerides: 45 mg/dL (ref 0.0–149.0)
VLDL: 9 mg/dL (ref 0.0–40.0)

## 2022-03-17 LAB — BASIC METABOLIC PANEL
BUN: 16 mg/dL (ref 6–23)
CO2: 29 mEq/L (ref 19–32)
Calcium: 9.7 mg/dL (ref 8.4–10.5)
Chloride: 102 mEq/L (ref 96–112)
Creatinine, Ser: 0.91 mg/dL (ref 0.40–1.20)
GFR: 68.93 mL/min (ref >60.00)
Glucose, Bld: 108 mg/dL — ABNORMAL HIGH (ref 70–99)
Potassium: 4.2 mEq/L (ref 3.5–5.1)
Sodium: 137 mEq/L (ref 135–145)

## 2022-03-17 NOTE — Assessment & Plan Note (Signed)
We discussed management of osteopenia with calcium and vit. D supplement. Also advised to maintain daily weight bearing and resistant/strength training exercise. She has no hx of fracture, so no need for pharmacologic therapy at this time Repeat dexa scan in 14yr

## 2022-03-17 NOTE — Progress Notes (Signed)
Established Patient Visit  Patient: Julia Holt   DOB: 10/01/1960   61 y.o. Adult  MRN: [id number] Visit Date: 03/17/2022  Subjective:    Chief Complaint  Patient presents with   Office visit    HTN/ hyperlipidemia  Doesn't check BP often Pt fasting C/o of shrinking & would like something to help with her bones  Flu shot today  Requesting records for Eye Exam    HPI Osteopenia of multiple sites We discussed management of osteopenia with calcium and vit. D supplement. Also advised to maintain daily weight bearing and resistant/strength training exercise. She has no hx of fracture, so no need for pharmacologic therapy at this time Repeat dexa scan in 52yr  Mixed diabetic hyperlipidemia associated with type 1 diabetes mellitus ([employer] Repeat lipid panel ASCVD risk at 5.4% No statin at this time.  Benign essential hypertension Bp at goal with amlodipine, lisinopril, spironolactone Propanolol prescribed by psychiatry to help with anxiety. BP Readings from Last 3 Encounters:  03/17/22 128/78  09/10/21 108/70  06/02/21 134/72   Repeat BMP Maintain med dose  Wt Readings from Last 3 Encounters:  03/17/22 173 lb (78.5 kg)  09/10/21 168 lb 9.6 oz (76.5 kg)  06/02/21 176 lb (79.8 kg)    Reviewed medical, surgical, and social history today  Medications: Outpatient Medications Prior to Visit  Medication Sig   amLODipine (NORVASC) 10 MG tablet TAKE 1 TABLET BY MOUTH ONCE DAILY   aspirin 81 MG chewable tablet Chew 1 tablet (81 mg total) by mouth daily.   Calcium Carbonate-Vitamin D3 600-400 MG-UNIT TABS Take by mouth 2 (two) times daily.   Continuous Blood Gluc Receiver (DEXCOM G6 RECEIVER) DEVI USE AS DIRECTED TO CHECK BLOOD SUGARS   Continuous Blood Gluc Sensor (DEXCOM G6 SENSOR) MISC Apply topically as directed.   fluvoxaMINE (LUVOX) 100 MG tablet Take 1 tablet (100 mg total) by mouth 2 (two) times daily.   HUMALOG 100 UNIT/ML injection Use up to 60 units  daily in insulin pump Maximum Daily Dose=60 units   Insulin Human (INSULIN PUMP) SOLN Inject into the skin. Husband has no idea what kind of insulin she takes. Husband states she is on a insulin pump. Pharmacy has a record of Novolog 60 units twice daily.   INSULIN SYRINGE 1CC/29G 29G X 1/2" 1 ML MISC Use to inject insulin daily   levothyroxine (SYNTHROID) 100 MCG tablet Take 1 tablet (100 mcg total) by mouth daily.   lisinopril (ZESTRIL) 40 MG tablet Take 1 tablet (40 mg total) by mouth daily.   Multiple Vitamins-Minerals (MULTIVITAMIN ADULTS 50+ PO) Take by mouth.   propranolol (INDERAL) 20 MG tablet Take 1 tablet (20 mg total) by mouth 2 (two) times daily as needed for anxiety.   spironolactone (ALDACTONE) 50 MG tablet Take 1 tablet (50 mg total) by mouth daily.   [DISCONTINUED] quinapril (ACCUPRIL) 20 MG tablet TAKE 1 TABLET (20 MG TOTAL) BY MOUTH 2 TIMES DAILY.   [DISCONTINUED] amLODipine (NORVASC) 10 MG tablet TAKE 1 TABLET BY MOUTH ONCE DAILY (Patient not taking: Reported on 03/17/2022)   [DISCONTINUED] Cholecalciferol (VITAMIN D) 50 MCG (2000 UT) tablet Take 2,000 Units by mouth daily. (Patient not taking: Reported on 03/17/2022)   [DISCONTINUED] Continuous Blood Gluc Sensor (DEXCOM G6 SENSOR) MISC Use one sensor every 10 days as directed (Patient not taking: Reported on 03/17/2022)   [DISCONTINUED] Continuous Blood Gluc Sensor (DEXCOM G6 SENSOR) MISC Use as  directed every 10 days (Patient not taking: Reported on 03/17/2022)   [DISCONTINUED] Continuous Blood Gluc Transmit (DEXCOM G6 TRANSMITTER) MISC USE AS DIRECTED FOR CONTINUOUS GLUCOSE MONITORING. REUSE TRANSMITTER FOR 90 DAYS THEN DISCARD AND REPLACE. (Patient not taking: Reported on 03/17/2022)   [DISCONTINUED] Continuous Blood Gluc Transmit (DEXCOM G6 TRANSMITTER) MISC Use as directed every 3 months (Patient not taking: Reported on 03/17/2022)   [DISCONTINUED] Continuous Blood Gluc Transmit (DEXCOM G6 TRANSMITTER) MISC Use to check blood  sugars (Patient not taking: Reported on 03/17/2022)   [DISCONTINUED] glucose blood test strip 1 each by Other route as needed for other. Use as instructed (Patient not taking: Reported on 03/17/2022)   [DISCONTINUED] levothyroxine (SYNTHROID) 100 MCG tablet Take 1 tablet (100 mcg total) by mouth daily. (Patient not taking: Reported on 03/17/2022)   [DISCONTINUED] levothyroxine (SYNTHROID) 88 MCG tablet Take 88 mcg by mouth daily. (Patient not taking: Reported on 03/17/2022)   [DISCONTINUED] neomycin-polymyxin-dexamethasone (MAXITROL) 0.1 % ophthalmic suspension Instill 1 drop into right eye four times a day (Patient not taking: Reported on 03/17/2022)   [DISCONTINUED] quinapril (ACCUPRIL) 20 MG tablet Take 1 tablet by mouth 2 times daily (Patient not taking: Reported on 03/17/2022)   No facility-administered medications prior to visit.   Reviewed past medical and social history.   ROS per HPI above      Objective:  BP 128/78 (BP Location: Left Arm, Patient Position: Sitting, Cuff Size: Normal)   Pulse 60   Temp 97.8 F (36.6 C) (Temporal)   Ht 5' 7.75" (1.721 m)   Wt 173 lb (78.5 kg)   SpO2 94%   BMI 26.50 kg/m      Physical Exam  Results for orders placed or performed in visit on 03/17/22  Lipid panel  Result Value Ref Range   Cholesterol 171 0 - 200 mg/dL   Triglycerides 45.0 0.0 - 149.0 mg/dL   HDL 74.00 >39.00 mg/dL   VLDL 9.0 0.0 - 40.0 mg/dL   LDL Cholesterol 88 0 - 99 mg/dL   Total CHOL/HDL Ratio 2    NonHDL 09.38   Basic metabolic panel  Result Value Ref Range   Sodium 137 135 - 145 mEq/L   Potassium 4.2 3.5 - 5.1 mEq/L   Chloride 102 96 - 112 mEq/L   CO2 29 19 - 32 mEq/L   Glucose, Bld 108 (H) 70 - 99 mg/dL   BUN 16 6 - 23 mg/dL   Creatinine, Ser 0.91 0.40 - 1.20 mg/dL   GFR 68.93 >60.00 mL/min   Calcium 9.7 8.4 - 10.5 mg/dL      Assessment & Plan:    Problem List Items Addressed This Visit       Cardiovascular and Mediastinum   Benign essential  hypertension - Primary    Bp at goal with amlodipine, lisinopril, spironolactone Propanolol prescribed by psychiatry to help with anxiety. BP Readings from Last 3 Encounters:  03/17/22 128/78  09/10/21 108/70  06/02/21 134/72   Repeat BMP Maintain med dose      Relevant Orders   Basic metabolic panel (Completed)     Endocrine   Mixed diabetic hyperlipidemia associated with type 1 diabetes mellitus ([place])    Repeat lipid panel ASCVD risk at 5.4% No statin at this time.      Relevant Orders   Lipid panel (Completed)     Musculoskeletal and Integument   Osteopenia of multiple sites    We discussed management of osteopenia with calcium and vit. D supplement. Also advised to  maintain daily weight bearing and resistant/strength training exercise. She has no hx of fracture, so no need for pharmacologic therapy at this time Repeat dexa scan in 68yr      Other Visit Diagnoses     Need for immunization against influenza       Relevant Orders   Flu Vaccine QUAD 6+ mos PF IM (Fluarix Quad PF) (Completed)      Return in about 6 months (around 09/15/2022) for CPE (fasting).     CWilfred Lacy NP

## 2022-03-17 NOTE — Patient Instructions (Signed)
Go to lab.  Osteopenia  Osteopenia is a loss of thickness (density) inside the bones. Another name for osteopenia is low bone mass. Mild osteopenia is a normal part of aging. It is not a disease, and it does not cause symptoms. However, if you have osteopenia and continue to lose bone mass, you could develop a condition that causes the bones to become thin and break more easily (osteoporosis). Osteoporosis can cause you to lose some height, have back pain, and have a stooped posture. Although osteopenia is not a disease, making changes to your lifestyle and diet can help to prevent osteopenia from developing into osteoporosis. What are the causes? Osteopenia is caused by loss of calcium in the bones. Bones are constantly changing. Old bone cells are continually being replaced with new bone cells. This process builds new bone. The mineral calcium is needed to build new bone and maintain bone density. Bone density is usually highest around age 55. After that, most people's bodies cannot replace all the bone they have lost with new bone. What increases the risk? You are more likely to develop this condition if: You are older than age 41. You are a woman who went through menopause early. You have a long illness that keeps you in bed. You do not get enough exercise. You lack certain nutrients (malnutrition). You have an overactive thyroid gland (hyperthyroidism). You use products that contain nicotine or tobacco, such as cigarettes, e-cigarettes and chewing tobacco, or you drink a lot of alcohol. You are taking medicines that weaken the bones, such as steroids. What are the signs or symptoms? This condition does not cause any symptoms. You may have a slightly higher risk for bone breaks (fractures), so getting fractures more easily than normal may be an indication of osteopenia. How is this diagnosed? This condition may be diagnosed based on an X-ray exam that measures bone density (dual-energy  X-ray absorptiometry, or DEXA). This test can measure bone density in your hips, spine, and wrists. Osteopenia has no symptoms, so this condition is usually diagnosed after a routine bone density screening test is done for osteoporosis. This routine screening is usually done for: Women who are age 22 or older. Men who are age 60 or older. If you have risk factors for osteopenia, you may have the screening test at an earlier age. How is this treated? Making dietary and lifestyle changes can lower your risk for osteoporosis. If you have severe osteopenia that is close to becoming osteoporosis, this condition can be treated with medicines and dietary supplements such as calcium and vitamin D. These supplements help to rebuild bone density. Follow these instructions at home: Eating and drinking Eat a diet that is high in calcium and vitamin D. Calcium is found in dairy products, beans, salmon, and leafy green vegetables like spinach and broccoli. Look for foods that have vitamin D and calcium added to them (fortified foods), such as orange juice, cereal, and bread.  Lifestyle Do 30 minutes or more of a weight-bearing exercise every day, such as walking, jogging, or playing a sport. These types of exercises strengthen the bones. Do not use any products that contain nicotine or tobacco, such as cigarettes, e-cigarettes, and chewing tobacco. If you need help quitting, ask your health care provider. Do not drink alcohol if: Your health care provider tells you not to drink. You are pregnant, may be pregnant, or are planning to become pregnant. If you drink alcohol: Limit how much you use to: 0-1 drink a  day for women. 0-2 drinks a day for men. Be aware of how much alcohol is in your drink. In the [place] one drink equals one 12 oz bottle of beer (355 mL), one 5 oz glass of wine (148 mL), or one 1 oz glass of hard liquor (44 mL). General instructions Take over-the-counter and prescription medicines  only as told by your health care provider. These include vitamins and supplements. Take precautions at home to lower your risk of falling, such as: Keeping rooms well-lit and free of clutter, such as cords. Installing safety rails on stairs. Using rubber mats in the bathroom or other areas that are often wet or slippery. Keep all follow-up visits. This is important. Contact a health care provider if: You have not had a bone density screening for osteoporosis and you are: A woman who is age 67 or older. A man who is age 60 or older. You are a postmenopausal woman who has not had a bone density screening for osteoporosis. You are older than age 33 and you want to know if you should have bone density screening for osteoporosis. Summary Osteopenia is a loss of thickness (density) inside the bones. Another name for osteopenia is low bone mass. Osteopenia is not a disease, but it may increase your risk for a condition that causes the bones to become thin and break more easily (osteoporosis). You may be at risk for osteopenia if you are older than age 64 or if you are a woman who went through early menopause. Osteopenia does not cause any symptoms, but it can be diagnosed with a bone density screening test. Dietary and lifestyle changes are the first treatment for osteopenia. These may lower your risk for osteoporosis. This information is not intended to replace advice given to you by your health care provider. Make sure you discuss any questions you have with your health care provider. Document Revised: 11/21/2019 Document Reviewed: 11/21/2019 Elsevier Patient Education  [place].

## 2022-03-17 NOTE — Assessment & Plan Note (Addendum)
Repeat lipid panel ASCVD risk at 5.4% No statin at this time.

## 2022-03-17 NOTE — Assessment & Plan Note (Addendum)
Bp at goal with amlodipine, lisinopril, spironolactone Propanolol prescribed by psychiatry to help with anxiety. BP Readings from Last 3 Encounters:  03/17/22 128/78  09/10/21 108/70  06/02/21 134/72   Repeat BMP Maintain med dose

## 2022-03-31 DIAGNOSIS — E119 Type 2 diabetes mellitus without complications: Secondary | ICD-10-CM | POA: Diagnosis not present

## 2022-03-31 DIAGNOSIS — H524 Presbyopia: Secondary | ICD-10-CM | POA: Diagnosis not present

## 2022-04-06 ENCOUNTER — Other Ambulatory Visit (HOSPITAL_BASED_OUTPATIENT_CLINIC_OR_DEPARTMENT_OTHER): Payer: Self-pay

## 2022-04-08 ENCOUNTER — Other Ambulatory Visit (HOSPITAL_BASED_OUTPATIENT_CLINIC_OR_DEPARTMENT_OTHER): Payer: Self-pay

## 2022-04-08 ENCOUNTER — Ambulatory Visit
Admission: EM | Admit: 2022-04-08 | Discharge: 2022-04-08 | Disposition: A | Discharge status: Home / Self Care | Payer: 59 | Attending: Emergency Medicine | Admitting: Emergency Medicine

## 2022-04-08 DIAGNOSIS — Z1152 Encounter for screening for COVID-19: Secondary | ICD-10-CM | POA: Insufficient documentation

## 2022-04-08 DIAGNOSIS — U071 COVID-19: Secondary | ICD-10-CM | POA: Diagnosis not present

## 2022-04-08 DIAGNOSIS — Z01812 Encounter for preprocedural laboratory examination: Secondary | ICD-10-CM | POA: Diagnosis not present

## 2022-04-08 MED ORDER — DEXCOM G6 TRANSMITTER MISC
2 refills | Status: AC
  Filled 2022-04-08: qty 1, 90d supply, fill #0
  Filled 2022-07-12: qty 1, 90d supply, fill #1
  Filled 2023-03-01: qty 1, 90d supply, fill #2

## 2022-04-08 MED ORDER — DEXCOM G6 SENSOR MISC
0 refills | Status: DC
  Filled 2022-04-08 – 2022-05-07 (×2): qty 9, 90d supply, fill #0

## 2022-04-08 NOTE — ED Triage Notes (Signed)
The patient states she took an at home Covid test this morning was positive but is needing PCR test for work.

## 2022-04-08 NOTE — Discharge Instructions (Signed)
Per my personal read of your home antigen COVID-19 test, I believe that the the second line is present and that this indicates presence of the COVID-19 virus in your nasal passages.  To satisfy your employer, we have performed a PCR test.  Please keep in mind that regardless of the PCR test, you had a positive antigen test which means you have been exposed to the virus.  I provided you with a note to return to work that you are welcome to print from your MyChart.  You have satisfy the 3-day requirement for isolation and can absolutely return to work on April 11, 2022.

## 2022-04-08 NOTE — ED Provider Notes (Signed)
[facility]    CSN: [id number] Arrival date & time: 04/08/22  1020    HISTORY   Chief Complaint  Patient presents with   Covid Positive   HPI Julia Holt is a pleasant, 61 y.o. adult who presents to urgent care today. The patient states she took an at home COVID-19 test this morning, which was positive but is needing PCR test for work.  Patient shows me an image of the COVID-19 test result on her phone.  The history is provided by the patient.   Past Medical History:  Diagnosis Date   Anxiety    Depression    Diabetes mellitus without complication ([place])    Hypertension    Thyroid disease    Patient Active Problem List   Diagnosis Date Noted   Osteopenia of multiple sites 06/02/2021   Vitreomacular adhesion of right eye 12/12/2019   Nuclear sclerotic cataract of both eyes 12/12/2019   Vitreomacular adhesion of left eye 12/12/2019   Adjustment disorder with mixed anxiety and depressed mood 07/21/2018   Mixed diabetic hyperlipidemia associated with type 1 diabetes mellitus ([place]) 07/21/2018   Osteoarthritis of right knee 04/12/2018   Osteoarthritis of carpometacarpal ([place]) joint of thumb 02/01/2018   Hypothyroidism, unspecified 05/03/2013   Benign essential hypertension 05/03/2013   Diabetes mellitus due to underlying condition with other specified complication, with long-term current use of insulin ([place])    Past Surgical History:  Procedure Laterality Date   BACK SURGERY     BUNIONECTOMY Left    OB History   No obstetric history on file.    Home Medications    Prior to Admission medications   Medication Sig Start Date End Date Taking? Authorizing Provider  amLODipine (NORVASC) 10 MG tablet TAKE 1 TABLET BY MOUTH ONCE DAILY 09/10/21 09/10/22  Nche, Charlene Brooke, NP  aspirin 81 MG chewable tablet Chew 1 tablet (81 mg total) by mouth daily. 06/07/16   Johnson, Clanford L, MD  Calcium Carbonate-Vitamin D3 600-400 MG-UNIT TABS Take by mouth 2 (two) times  daily.    [provider]  Continuous Blood Gluc Receiver (DEXCOM G6 RECEIVER) [place] USE AS DIRECTED TO CHECK BLOOD SUGARS 02/19/20   [provider]  Continuous Blood Gluc Sensor (DEXCOM G6 SENSOR) MISC Apply topically as directed. 07/20/20   [provider]  fluvoxaMINE (LUVOX) 100 MG tablet Take 1 tablet (100 mg total) by mouth 2 (two) times daily. 09/09/21 09/09/22  Cottle, Billey Co., MD  HUMALOG 100 UNIT/ML injection Use up to 60 units daily in insulin pump Maximum Daily Dose=60 units 10/19/21     Insulin Human (INSULIN PUMP) SOLN Inject into the skin. Husband has no idea what kind of insulin she takes. Husband states she is on a insulin pump. Pharmacy has a record of Novolog 60 units twice daily.    [provider]  INSULIN SYRINGE 1CC/29G 29G X 1/2" 1 ML MISC Use to inject insulin daily 11/03/20   Katheran James., MD  levothyroxine (SYNTHROID) 100 MCG tablet Take 1 tablet (100 mcg total) by mouth daily. 09/27/21     lisinopril (ZESTRIL) 40 MG tablet Take 1 tablet (40 mg total) by mouth daily. 09/30/21     Multiple Vitamins-Minerals (MULTIVITAMIN ADULTS 50+ PO) Take by mouth.    [provider]  propranolol (INDERAL) 20 MG tablet Take 1 tablet (20 mg total) by mouth 2 (two) times daily as needed for anxiety. 09/09/21   Cottle, Billey Co., MD  spironolactone (  ALDACTONE) 50 MG tablet Take 1 tablet (50 mg total) by mouth daily. 10/18/21       Family History Family History  Problem Relation Age of Onset   Hypertension Mother    Hyperlipidemia Mother    Dementia Mother    Alzheimer's disease Father    Social History Social History   Tobacco Use   Smoking status: Never   Smokeless tobacco: Never  Vaping Use   Vaping Use: Never used  Substance Use Topics   Alcohol use: No   Drug use: No   Allergies   Latex and Sulfa antibiotics  Review of Systems Review of Systems Pertinent findings revealed after performing a 14 point review of systems  has been noted in the history of present illness.  Physical Exam Triage Vital Signs ED Triage Vitals  Enc Vitals Group     BP 04/16/21 0827 (!) 147/82     Pulse Rate 04/16/21 0827 72     Resp 04/16/21 0827 18     Temp 04/16/21 0827 98.3 F (36.8 C)     Temp Source 04/16/21 0827 Oral     SpO2 04/16/21 0827 98 %     Weight --      Height --      Head Circumference --      Peak Flow --      Pain Score 04/16/21 0826 5     Pain Loc --      Pain Edu? --      Excl. in [place]? --   No data found.  Updated Vital Signs BP (!) 145/89 (BP Location: Right Arm)   Pulse 85   Temp 98.6 F (37 C) (Oral)   Resp 16   SpO2 93%   Physical Exam Vitals and nursing note reviewed.  Constitutional:      General: CHRISTNA KULICK is not in acute distress.    Appearance: Normal appearance. AVION PATELLA is not ill-appearing.  HENT:     Head: Normocephalic and atraumatic.     Salivary Glands: Right salivary gland is not diffusely enlarged or tender. Left salivary gland is not diffusely enlarged or tender.     Right Ear: Tympanic membrane, ear canal and external ear normal. No drainage. No middle ear effusion. There is no impacted cerumen. Tympanic membrane is not erythematous or bulging.     Left Ear: Tympanic membrane, ear canal and external ear normal. No drainage.  No middle ear effusion. There is no impacted cerumen. Tympanic membrane is not erythematous or bulging.     Nose: Nose normal. No nasal deformity, septal deviation, mucosal edema, congestion or rhinorrhea.     Right Turbinates: Not enlarged, swollen or pale.     Left Turbinates: Not enlarged, swollen or pale.     Right Sinus: No maxillary sinus tenderness or frontal sinus tenderness.     Left Sinus: No maxillary sinus tenderness or frontal sinus tenderness.     Mouth/Throat:     Lips: Pink. No lesions.     Mouth: Mucous membranes are moist. No oral lesions.     Pharynx: Oropharynx is clear. Uvula midline. No posterior oropharyngeal  erythema or uvula swelling.     Tonsils: No tonsillar exudate. 0 on the right. 0 on the left.  Eyes:     General: Lids are normal.        Right eye: No discharge.        Left eye: No discharge.     Extraocular Movements: Extraocular movements  intact.     Conjunctiva/sclera: Conjunctivae normal.     Right eye: Right conjunctiva is not injected.     Left eye: Left conjunctiva is not injected.  Neck:     Trachea: Trachea and phonation normal.  Cardiovascular:     Rate and Rhythm: Normal rate and regular rhythm.     Pulses: Normal pulses.     Heart sounds: Normal heart sounds. No murmur heard.    No friction rub. No gallop.  Pulmonary:     Effort: Pulmonary effort is normal. No accessory muscle usage, prolonged expiration or respiratory distress.     Breath sounds: Normal breath sounds. No stridor, decreased air movement or transmitted upper airway sounds. No decreased breath sounds, wheezing, rhonchi or rales.  Chest:     Chest wall: No tenderness.  Musculoskeletal:        General: Normal range of motion.     Cervical back: Normal range of motion and neck supple. Normal range of motion.  Lymphadenopathy:     Cervical: No cervical adenopathy.  Skin:    General: Skin is warm and dry.     Findings: No erythema or rash.  Neurological:     General: No focal deficit present.     Mental Status: CLORINDA WYBLE is alert and oriented to person, place, and time.  Psychiatric:        Mood and Affect: Mood normal.        Behavior: Behavior normal.     Visual Acuity Right Eye Distance:   Left Eye Distance:   Bilateral Distance:    Right Eye Near:   Left Eye Near:    Bilateral Near:     [facility] Couse / Diagnostics / Procedures:     Radiology No results found.  Procedures Procedures (including critical care time) EKG  Pending results:  Labs Reviewed  SARS CORONAVIRUS 2 (TAT 6-24 HRS)    Medications Ordered in UC: Medications - No data to display  UC Diagnoses / Final Clinical  Impressions(s)   I have reviewed the triage vital signs and the nursing notes.  Pertinent labs & imaging results that were available during my care of the patient were reviewed by me and considered in my medical decision making (see chart for details).    Final diagnoses:  Encounter for screening laboratory testing for COVID-19 virus in asymptomatic patient  [id number]   Per my personal read of her COVID-19 test, I see a faintly positive result and agree with patient that the test was positive.  To satisfy her employer's requirements, even though she is currently using  [employer] for vacation and only to the COVID-19 test because she was feeling unwell this morning, we have provided her with a PCR test.  Due to the unreliable nature of antigen and PCR test, there is a possibility that this result will be negative as well.  Patient advised that PCR test result is not necessary to confirm what she sees on the antigen test but I do believe she has had COVID-19.  Fortunately, by the time she returns to work as previously planned on October 23, she will have completed the recommended 3 days of isolation and can return to work regardless of the results of the PCR test.  I provided her with a note stating that.  Patient reports feeling well at this time, no further intervention is needed.  ED Prescriptions   None    PDMP not reviewed this encounter.  Disposition Upon Discharge:  Condition: stable for discharge home Home: take medications as prescribed; routine discharge instructions as discussed; follow up as advised.  Patient presented with an acute illness with associated systemic symptoms and significant discomfort requiring urgent management. In my opinion, this is a condition that a prudent lay person (someone who possesses an average knowledge of health and medicine) may potentially expect to result in complications if not addressed urgently such as respiratory distress, impairment of bodily function or  dysfunction of bodily organs.   Routine symptom specific, illness specific and/or disease specific instructions were discussed with the patient and/or caregiver at length.   As such, the patient has been evaluated and assessed, work-up was performed and treatment was provided in alignment with urgent care protocols and evidence based medicine.  Patient/parent/caregiver has been advised that the patient may require follow up for further testing and treatment if the symptoms continue in spite of treatment, as clinically indicated and appropriate.  If the patient was tested for COVID-19, Influenza and/or RSV, then the patient/parent/guardian was advised to isolate at home pending the results of his/her diagnostic coronavirus test and potentially longer if they're positive. I have also advised pt that if his/her COVID-19 test returns positive, it's recommended to self-isolate for at least 10 days after symptoms first appeared AND until fever-free for 24 hours without fever reducer AND other symptoms have improved or resolved. Discussed self-isolation recommendations as well as instructions for household member/close contacts as per the [facility] and [place] DHHS, and also gave patient the [place] packet with this information.  Patient/parent/caregiver has been advised to return to the [facility] or PCP in 3-5 days if no better; to PCP or the Emergency Department if new signs and symptoms develop, or if the current signs or symptoms continue to change or worsen for further workup, evaluation and treatment as clinically indicated and appropriate  The patient will follow up with their current PCP if and as advised. If the patient does not currently have a PCP we will assist them in obtaining one.   The patient may need specialty follow up if the symptoms continue, in spite of conservative treatment and management, for further workup, evaluation, consultation and treatment as clinically indicated and  appropriate.  Patient/parent/caregiver verbalized understanding and agreement of plan as discussed.  All questions were addressed during visit.  Please see discharge instructions below for further details of plan.  Discharge Instructions:   Discharge Instructions      Per my personal read of your home antigen COVID-19 test, I believe that the the second line is present and that this indicates presence of the COVID-19 virus in your nasal passages.  To satisfy your employer, we have performed a PCR test.  Please keep in mind that regardless of the PCR test, you had a positive antigen test which means you have been exposed to the virus.  I provided you with a note to return to work that you are welcome to print from your MyChart.  You have satisfy the 3-day requirement for isolation and can absolutely return to work on April 11, 2022.    This office note has been dictated using Museum/gallery curator.  Unfortunately, this method of dictation can sometimes lead to typographical or grammatical errors.  I apologize for your inconvenience in advance if this occurs.  Please do not hesitate to reach out to me if clarification is needed.      Lynden Oxford Scales, PA-C 04/08/22 1247

## 2022-04-09 LAB — SARS CORONAVIRUS 2 (TAT 6-24 HRS): SARS Coronavirus 2: NEGATIVE

## 2022-04-11 ENCOUNTER — Other Ambulatory Visit (HOSPITAL_BASED_OUTPATIENT_CLINIC_OR_DEPARTMENT_OTHER): Payer: Self-pay

## 2022-04-12 ENCOUNTER — Other Ambulatory Visit (HOSPITAL_BASED_OUTPATIENT_CLINIC_OR_DEPARTMENT_OTHER): Payer: Self-pay

## 2022-04-13 ENCOUNTER — Other Ambulatory Visit (HOSPITAL_BASED_OUTPATIENT_CLINIC_OR_DEPARTMENT_OTHER): Payer: Self-pay

## 2022-04-13 MED ORDER — SPIRONOLACTONE 50 MG PO TABS
50.0000 mg | ORAL_TABLET | Freq: Every day | ORAL | 0 refills | Status: DC
  Filled 2022-04-13: qty 30, 30d supply, fill #0

## 2022-04-18 ENCOUNTER — Other Ambulatory Visit (HOSPITAL_BASED_OUTPATIENT_CLINIC_OR_DEPARTMENT_OTHER): Payer: Self-pay

## 2022-04-21 ENCOUNTER — Other Ambulatory Visit (HOSPITAL_BASED_OUTPATIENT_CLINIC_OR_DEPARTMENT_OTHER): Payer: Self-pay

## 2022-04-25 ENCOUNTER — Other Ambulatory Visit (HOSPITAL_BASED_OUTPATIENT_CLINIC_OR_DEPARTMENT_OTHER): Payer: Self-pay

## 2022-04-26 ENCOUNTER — Other Ambulatory Visit (HOSPITAL_BASED_OUTPATIENT_CLINIC_OR_DEPARTMENT_OTHER): Payer: Self-pay

## 2022-04-26 DIAGNOSIS — E039 Hypothyroidism, unspecified: Secondary | ICD-10-CM | POA: Diagnosis not present

## 2022-04-26 DIAGNOSIS — Z9641 Presence of insulin pump (external) (internal): Secondary | ICD-10-CM | POA: Diagnosis not present

## 2022-04-26 DIAGNOSIS — Z4681 Encounter for fitting and adjustment of insulin pump: Secondary | ICD-10-CM | POA: Diagnosis not present

## 2022-04-26 DIAGNOSIS — Z79890 Hormone replacement therapy (postmenopausal): Secondary | ICD-10-CM | POA: Diagnosis not present

## 2022-04-26 DIAGNOSIS — E109 Type 1 diabetes mellitus without complications: Secondary | ICD-10-CM | POA: Diagnosis not present

## 2022-04-26 DIAGNOSIS — I10 Essential (primary) hypertension: Secondary | ICD-10-CM | POA: Diagnosis not present

## 2022-04-27 ENCOUNTER — Other Ambulatory Visit (HOSPITAL_BASED_OUTPATIENT_CLINIC_OR_DEPARTMENT_OTHER): Payer: Self-pay

## 2022-04-27 MED ORDER — LEVOTHYROXINE SODIUM 100 MCG PO TABS
100.0000 ug | ORAL_TABLET | Freq: Every day | ORAL | 3 refills | Status: DC
  Filled 2022-04-27: qty 30, 30d supply, fill #0
  Filled 2022-06-15: qty 30, 30d supply, fill #1
  Filled 2022-07-12: qty 30, 30d supply, fill #2
  Filled 2022-07-24 – 2022-08-18 (×2): qty 30, 30d supply, fill #3

## 2022-05-02 ENCOUNTER — Other Ambulatory Visit (HOSPITAL_BASED_OUTPATIENT_CLINIC_OR_DEPARTMENT_OTHER): Payer: Self-pay

## 2022-05-06 ENCOUNTER — Other Ambulatory Visit (HOSPITAL_BASED_OUTPATIENT_CLINIC_OR_DEPARTMENT_OTHER): Payer: Self-pay

## 2022-05-09 ENCOUNTER — Other Ambulatory Visit (HOSPITAL_BASED_OUTPATIENT_CLINIC_OR_DEPARTMENT_OTHER): Payer: Self-pay

## 2022-05-09 DIAGNOSIS — E109 Type 1 diabetes mellitus without complications: Secondary | ICD-10-CM | POA: Diagnosis not present

## 2022-05-10 DIAGNOSIS — E039 Hypothyroidism, unspecified: Secondary | ICD-10-CM | POA: Diagnosis not present

## 2022-05-16 ENCOUNTER — Other Ambulatory Visit (HOSPITAL_BASED_OUTPATIENT_CLINIC_OR_DEPARTMENT_OTHER): Payer: Self-pay

## 2022-05-18 ENCOUNTER — Other Ambulatory Visit (HOSPITAL_BASED_OUTPATIENT_CLINIC_OR_DEPARTMENT_OTHER): Payer: Self-pay

## 2022-05-18 MED ORDER — SPIRONOLACTONE 50 MG PO TABS
50.0000 mg | ORAL_TABLET | Freq: Every day | ORAL | 1 refills | Status: AC
  Filled 2022-05-18: qty 90, 90d supply, fill #0
  Filled 2022-09-02 – 2022-11-28 (×2): qty 90, 90d supply, fill #1

## 2022-05-23 ENCOUNTER — Other Ambulatory Visit (HOSPITAL_BASED_OUTPATIENT_CLINIC_OR_DEPARTMENT_OTHER): Payer: Self-pay

## 2022-05-23 DIAGNOSIS — H2011 Chronic iridocyclitis, right eye: Secondary | ICD-10-CM | POA: Diagnosis not present

## 2022-05-23 MED ORDER — PREDNISOLONE ACETATE 1 % OP SUSP
OPHTHALMIC | 0 refills | Status: AC
  Filled 2022-05-23: qty 10, 14d supply, fill #0

## 2022-06-16 ENCOUNTER — Other Ambulatory Visit (HOSPITAL_BASED_OUTPATIENT_CLINIC_OR_DEPARTMENT_OTHER): Payer: Self-pay

## 2022-06-24 ENCOUNTER — Ambulatory Visit (INDEPENDENT_AMBULATORY_CARE_PROVIDER_SITE_OTHER): Payer: Commercial Managed Care - PPO | Admitting: Psychiatry

## 2022-06-24 ENCOUNTER — Encounter: Payer: Self-pay | Admitting: Psychiatry

## 2022-06-24 ENCOUNTER — Other Ambulatory Visit (HOSPITAL_BASED_OUTPATIENT_CLINIC_OR_DEPARTMENT_OTHER): Payer: Self-pay

## 2022-06-24 DIAGNOSIS — F40240 Claustrophobia: Secondary | ICD-10-CM | POA: Diagnosis not present

## 2022-06-24 DIAGNOSIS — F331 Major depressive disorder, recurrent, moderate: Secondary | ICD-10-CM

## 2022-06-24 DIAGNOSIS — F422 Mixed obsessional thoughts and acts: Secondary | ICD-10-CM

## 2022-06-24 MED ORDER — BUPROPION HCL ER (XL) 150 MG PO TB24
ORAL_TABLET | ORAL | 0 refills | Status: DC
  Filled 2022-06-24: qty 30, 17d supply, fill #0

## 2022-06-24 NOTE — Progress Notes (Signed)
Julia Holt [id number] [birth year] 62 y.o.  Subjective:   Patient ID:  Julia Holt is a 62 y.o. (DOB June 06, 1961) adult.  Chief Complaint:  Chief Complaint  Patient presents with   Follow-up   Depression   Anxiety    Depression        Associated symptoms include fatigue.  Associated symptoms include no decreased concentration and no suicidal ideas.  Past medical history includes anxiety.   Anxiety Symptoms include nervous/anxious behavior. Patient reports no confusion, decreased concentration, dizziness, palpitations or suicidal ideas.     Julia Holt presents to the office today for follow-up of psychiatric diagnoses.  seen March 2020.  Since then she elected to stop Wellbutrin out of concern about potential interaction with propranolol and drug screens at work.  Her mother died 2019-01-23 she had dementia.  At the end she did not recognize her daughter.  It's been really hard.  Misses her.  She called in June asking to increase fluvoxamine to 150 or 200 mg daily.  Started travelling to [place] for work in July and so stressed by the commute.  Exhausted by the end of the day.  Prayed a lot about it.  Feels God told her to resign.  Leaving October 2.  Can't handle the drive anymore.  Anxious driving.  Feels good about the decision.  Julia Holt has driven her some of the time.  Going into job search at [employer].    seen March 06, 2019.  She was still dealing with grief over the loss of her mother.  We made no med change.  She continued fluvoxamine 200 mg daily as well as propranolol 20 mg twice daily as needed anxiety.  seen January 2021.  No med changes.    10/30/19  Appt with the following noted: No job yet.  Still looking, but expects to get hired today.  Got through mother's day OK.  Managing OK.   Taking the propranolol mainly just once daily.  Still struggling over mother's death with holidays.  Ashes in urn with her dad.  All the political and social unrest  watching CNN is depressing.   No med changes  04/10/20 appt with the following noted: Doing well.  Some stress with new job and using a little more propranolol and it's been helpful for anxiety.  Taking 20 mg propranolol daily. Plan: Satisfied with the medications.  No change indicated. Continue fluvoxamine 200 mg daily. Propranolol 20 mg prn anxiety.  12/09/2020 appointment with the following noted: A little more tired lately. H low platelets and has been sick.  Stressful.  Up and down is frustrating.  He's tired all the time.  Worries over H.  Wants him to feel better.  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 No SE. Patient reports grief issues but not depression.    Some anxierty and taking the propranolol daily but it helps.  Overall handling things OK.  Patient denies difficulty with sleep initiation or maintenance, but some negative effects temporarily related to grief and stress work.  I get enough sleep.  Denies appetite disturbance.  Patient reports that energy and motivation have been good.  Patient denies any difficulty with concentration.  Patient denies any suicidal ideation. Plan: Satisfied with the medications.  No change indicated. Continue fluvoxamine 200 mg daily. Propranolol 20 mg prn anxiety.  09/09/21 appt noted: On fluvoxamine 100 BID, propranolol 20 mg AM and not weekends.  Sometimes needed at night.  Lost 20# which helped knees.  Thyroid labs mixed up.  From hyper to hypothyroidism.  Meds are starting to work.  Still tired end of the day, but energy is better.  Not irritable.  Less cold intolerant.   Lost dog to age.  Got another Shitzu. Sleep is good. No SE Minimal OCD.  Some days more anxiety.  Propranolol helps.  06/24/22 appt noted: More down and low energy and not motivated.  Going on a few months. Normal thyroid.   Not much anxiety.  Dexcom DM meter.  Likes it.  Past Psychiatric Medication Trials:  Fluvoxamine, venlafaxine, Wellbutrin 300  helped, paroxetine 40 wt gain, propranolol  Her last episode of depression occurred in July 2019 and we increase the Wellbutrin to 300 mg and it resolved  Review of Systems:  Review of Systems  Constitutional:  Positive for fatigue.  Cardiovascular:  Negative for palpitations.  Neurological:  Negative for dizziness, tremors and weakness.  Psychiatric/Behavioral:  Positive for depression and dysphoric mood. Negative for agitation, behavioral problems, confusion, decreased concentration, hallucinations, self-injury, sleep disturbance and suicidal ideas. The patient is nervous/anxious. The patient is not hyperactive.     Medications: I have reviewed the patient's current medications.  Current Outpatient Medications  Medication Sig Dispense Refill   amLODipine (NORVASC) 10 MG tablet TAKE 1 TABLET BY MOUTH ONCE DAILY 90 tablet 3   aspirin 81 MG chewable tablet Chew 1 tablet (81 mg total) by mouth daily.     buPROPion (WELLBUTRIN XL) 150 MG 24 hr tablet 1 in the AM for 1 week then 2 each AM 30 tablet 0   Calcium Carbonate-Vitamin D3 600-400 MG-UNIT TABS Take by mouth 2 (two) times daily.     Continuous Blood Gluc Receiver (DEXCOM G6 RECEIVER) DEVI USE AS DIRECTED TO CHECK BLOOD SUGARS     Continuous Blood Gluc Sensor (DEXCOM G6 SENSOR) MISC Apply topically as directed.     Continuous Blood Gluc Sensor (DEXCOM G6 SENSOR) MISC Use as directed every 10 days 9 each 0   Continuous Blood Gluc Transmit (DEXCOM G6 TRANSMITTER) MISC Use to check blood sugars as directed 1 each 2   fluvoxaMINE (LUVOX) 100 MG tablet Take 1 tablet (100 mg total) by mouth 2 (two) times daily. 180 tablet 3   HUMALOG 100 UNIT/ML injection Use up to 60 units daily in insulin pump Maximum Daily Dose=60 units 60 mL 2   Insulin Human (INSULIN PUMP) SOLN Inject into the skin. Husband has no idea what kind of insulin she takes. Husband states she is on a insulin pump. Pharmacy has a record of Novolog 60 units twice daily.     INSULIN  SYRINGE 1CC/29G 29G X 1/2" 1 ML MISC Use to inject insulin daily 50 each 1   levothyroxine (SYNTHROID) 100 MCG tablet Take 1 tablet (100 mcg total) by mouth daily. 30 tablet 3   lisinopril (ZESTRIL) 40 MG tablet Take 1 tablet (40 mg total) by mouth daily. 90 tablet 3   Multiple Vitamins-Minerals (MULTIVITAMIN ADULTS 50+ PO) Take by mouth.     prednisoLONE acetate (PRED FORTE) 1 % ophthalmic suspension Instill 1 drop into right eye as directed: 1 drop every 2 hours in right eye x 1 day, then 1 drop 4 times a day in right eye for 1 week, then 1 drop right eye twice a day for 1 week 10 mL 0   propranolol (INDERAL) 20 MG tablet Take 1 tablet (20 mg total) by mouth 2 (two) times daily as needed  for anxiety. 180 tablet 3   spironolactone (ALDACTONE) 50 MG tablet Take 1 tablet (50 mg total) by mouth daily. Keep appointment on 04/26/22 for future refills. 90 tablet 1   No current facility-administered medications for this visit.    Medication Side Effects: None  Allergies:  Allergies  Allergen Reactions   Latex Other (See Comments)   Sulfa Antibiotics Hives and Itching    Past Medical History:  Diagnosis Date   Anxiety    Depression    Diabetes mellitus without complication ([place])    Hypertension    Thyroid disease     Family History  Problem Relation Age of Onset   Hypertension Mother    Hyperlipidemia Mother    Dementia Mother    Alzheimer's disease Father     Social History   Socioeconomic History   Marital status: Married    Spouse name: Not on file   Number of children: Not on file   Years of education: Not on file   Highest education level: Not on file  Occupational History   Not on file  Tobacco Use   Smoking status: Never   Smokeless tobacco: Never  Vaping Use   Vaping Use: Never used  Substance and Sexual Activity   Alcohol use: No   Drug use: No   Sexual activity: Yes  Other Topics Concern   Not on file  Social History Narrative   Not on file   Social  Determinants of Health   Financial Resource Strain: Not on file  Food Insecurity: Not on file  Transportation Needs: Not on file  Physical Activity: Not on file  Stress: Not on file  Social Connections: Not on file  Intimate Partner Violence: Not on file    Past Medical History, Surgical history, Social history, and Family history were reviewed and updated as appropriate.   Please see review of systems for further details on the patient's review from today.   Objective:   Physical Exam:  There were no vitals taken for this visit.  Physical Exam Constitutional:      General: Julia Holt is not in acute distress.    Appearance: Julia Holt is well-developed.  Musculoskeletal:        General: No deformity.  Neurological:     Mental Status: Julia Holt is alert and oriented to person, place, and time.     Motor: No tremor.     Coordination: Coordination normal.     Gait: Gait normal.  Psychiatric:        Attention and Perception: Julia Holt does not perceive auditory hallucinations.        Mood and Affect: Mood is anxious and depressed. Affect is not labile, blunt, angry, tearful or inappropriate.        Speech: Speech normal. Speech is not slurred.        Behavior: Behavior normal.        Thought Content: Thought content normal. Thought content is not delusional. Thought content does not include homicidal or suicidal ideation. Thought content does not include suicidal plan.        Cognition and Memory: Cognition normal.        Judgment: Judgment normal.     Comments: Insight intact. No auditory or visual hallucinations.  Anxiety is manageable.      Lab Review:     Component Value Date/Time   NA 137 03/17/2022 0910   NA 139 07/31/2020 1540   K 4.2 03/17/2022 0910  CL 102 03/17/2022 0910   CO2 29 03/17/2022 0910   GLUCOSE 108 (H) 03/17/2022 0910   BUN 16 03/17/2022 0910   BUN 18 07/31/2020 1540   CREATININE 0.91 03/17/2022 0910   CALCIUM 9.7 03/17/2022  0910   PROT 7.4 09/10/2021 0857   ALBUMIN 4.7 09/10/2021 0857   AST 24 09/10/2021 0857   ALT 18 09/10/2021 0857   ALKPHOS 80 09/10/2021 0857   BILITOT 0.5 09/10/2021 0857   GFRNONAA 84 07/31/2020 1540   GFRAA 97 07/31/2020 1540       Component Value Date/Time   WBC 6.5 09/10/2021 0857   RBC 4.60 09/10/2021 0857   HGB 14.8 09/10/2021 0857   HGB 14.8 07/31/2020 1540   HCT 43.8 09/10/2021 0857   HCT 44.7 07/31/2020 1540   PLT 241.0 09/10/2021 0857   PLT 237 07/31/2020 1540   MCV 95.2 09/10/2021 0857   MCV 94 07/31/2020 1540   MCH 31.0 07/31/2020 1540   MCH 32.3 06/06/2016 0416   MCHC 33.8 09/10/2021 0857   RDW 12.8 09/10/2021 0857   RDW 11.9 07/31/2020 1540   LYMPHSABS 1.7 06/04/2016 0448   MONOABS 0.6 06/04/2016 0448   EOSABS 0.0 06/04/2016 0448   BASOSABS 0.0 06/04/2016 0448    No results found for: "POCLITH", "LITHIUM"   No results found for: "PHENYTOIN", "PHENOBARB", "VALPROATE", "CBMZ"   .res Assessment: Plan:    Major depressive disorder, recurrent episode, moderate ([place]) - Plan: buPROPion (WELLBUTRIN XL) 150 MG 24 hr tablet  Mixed obsessional thoughts and acts  Claustrophobia  Anxiety manageable but depression has recurred over th elast few months.  Historically her depressive episodes have typically been triggered.  Thyroid problems are being addressed.  Because it is been helpful continue propranolol low-dose 20 mg twice a day as needed for anxiety.  Discussed the possibility it could mask symptoms of hypoglycemia but at this tiny dose that should be not a problem.  Restart Wellbutrin XL 150 then 300 mg AM.  It worked in the past. Continue fluvoxamine 200 mg daily. Propranolol 20 mg prn anxiety. She takes it each AM.  Still helps  Disc SE in detail and SSRI withdrawal sx.  Follow-up 1 months.  Call if need to be seen sooner.  Lynder Parents MD, DFAPA   Please see After Visit Summary for patient specific instructions.  Future Appointments  Date  Time Provider [place]  08/31/2022  1:00 PM Cottle, Billey Co., MD CP-CP None  12/15/2022  3:45 PM Rankin, Clent Demark, MD RDE-RDE None    No orders of the defined types were placed in this encounter.     -------------------------------

## 2022-06-28 DIAGNOSIS — N952 Postmenopausal atrophic vaginitis: Secondary | ICD-10-CM | POA: Diagnosis not present

## 2022-06-28 DIAGNOSIS — Z01419 Encounter for gynecological examination (general) (routine) without abnormal findings: Secondary | ICD-10-CM | POA: Diagnosis not present

## 2022-07-04 ENCOUNTER — Other Ambulatory Visit (HOSPITAL_BASED_OUTPATIENT_CLINIC_OR_DEPARTMENT_OTHER): Payer: Self-pay

## 2022-07-04 MED ORDER — HUMALOG 100 UNIT/ML IJ SOLN
INTRAMUSCULAR | 2 refills | Status: DC
  Filled 2022-07-04: qty 50, 83d supply, fill #0
  Filled 2022-10-03: qty 50, 83d supply, fill #1
  Filled 2023-03-01: qty 50, 83d supply, fill #2
  Filled 2023-06-02: qty 30, 50d supply, fill #3

## 2022-07-11 ENCOUNTER — Other Ambulatory Visit: Payer: Self-pay | Admitting: Psychiatry

## 2022-07-11 DIAGNOSIS — Z1211 Encounter for screening for malignant neoplasm of colon: Secondary | ICD-10-CM | POA: Diagnosis not present

## 2022-07-11 DIAGNOSIS — K640 First degree hemorrhoids: Secondary | ICD-10-CM | POA: Diagnosis not present

## 2022-07-11 DIAGNOSIS — Z86010 Personal history of colonic polyps: Secondary | ICD-10-CM | POA: Diagnosis not present

## 2022-07-11 DIAGNOSIS — F331 Major depressive disorder, recurrent, moderate: Secondary | ICD-10-CM

## 2022-07-11 DIAGNOSIS — Z8719 Personal history of other diseases of the digestive system: Secondary | ICD-10-CM | POA: Diagnosis not present

## 2022-07-11 MED ORDER — BUPROPION HCL ER (XL) 300 MG PO TB24
300.0000 mg | ORAL_TABLET | Freq: Every day | ORAL | 0 refills | Status: DC
  Filled 2022-07-11: qty 90, 90d supply, fill #0

## 2022-07-12 ENCOUNTER — Other Ambulatory Visit (HOSPITAL_BASED_OUTPATIENT_CLINIC_OR_DEPARTMENT_OTHER): Payer: Self-pay

## 2022-07-13 ENCOUNTER — Other Ambulatory Visit (HOSPITAL_BASED_OUTPATIENT_CLINIC_OR_DEPARTMENT_OTHER): Payer: Self-pay

## 2022-07-21 DIAGNOSIS — Z1231 Encounter for screening mammogram for malignant neoplasm of breast: Secondary | ICD-10-CM | POA: Diagnosis not present

## 2022-07-24 ENCOUNTER — Other Ambulatory Visit (HOSPITAL_BASED_OUTPATIENT_CLINIC_OR_DEPARTMENT_OTHER): Payer: Self-pay

## 2022-07-24 ENCOUNTER — Other Ambulatory Visit: Payer: Self-pay | Admitting: Nurse Practitioner

## 2022-07-25 ENCOUNTER — Other Ambulatory Visit: Payer: Self-pay

## 2022-07-25 ENCOUNTER — Other Ambulatory Visit: Payer: Self-pay | Admitting: Psychiatry

## 2022-07-25 ENCOUNTER — Other Ambulatory Visit (HOSPITAL_BASED_OUTPATIENT_CLINIC_OR_DEPARTMENT_OTHER): Payer: Self-pay

## 2022-07-25 DIAGNOSIS — F331 Major depressive disorder, recurrent, moderate: Secondary | ICD-10-CM

## 2022-07-25 MED ORDER — DEXCOM G6 SENSOR MISC
2 refills | Status: AC
  Filled 2022-07-25 – 2022-08-01 (×2): qty 9, 90d supply, fill #0
  Filled 2023-03-01: qty 9, 90d supply, fill #1

## 2022-07-26 ENCOUNTER — Other Ambulatory Visit (HOSPITAL_BASED_OUTPATIENT_CLINIC_OR_DEPARTMENT_OTHER): Payer: Self-pay

## 2022-07-29 DIAGNOSIS — E1065 Type 1 diabetes mellitus with hyperglycemia: Secondary | ICD-10-CM | POA: Diagnosis not present

## 2022-07-29 DIAGNOSIS — E109 Type 1 diabetes mellitus without complications: Secondary | ICD-10-CM | POA: Diagnosis not present

## 2022-08-01 ENCOUNTER — Other Ambulatory Visit: Payer: Self-pay

## 2022-08-01 ENCOUNTER — Other Ambulatory Visit (HOSPITAL_BASED_OUTPATIENT_CLINIC_OR_DEPARTMENT_OTHER): Payer: Self-pay

## 2022-08-04 ENCOUNTER — Other Ambulatory Visit (HOSPITAL_BASED_OUTPATIENT_CLINIC_OR_DEPARTMENT_OTHER): Payer: Self-pay

## 2022-08-11 DIAGNOSIS — E109 Type 1 diabetes mellitus without complications: Secondary | ICD-10-CM | POA: Diagnosis not present

## 2022-08-12 DIAGNOSIS — E109 Type 1 diabetes mellitus without complications: Secondary | ICD-10-CM | POA: Diagnosis not present

## 2022-08-19 ENCOUNTER — Other Ambulatory Visit (HOSPITAL_BASED_OUTPATIENT_CLINIC_OR_DEPARTMENT_OTHER): Payer: Self-pay

## 2022-08-25 ENCOUNTER — Other Ambulatory Visit (HOSPITAL_BASED_OUTPATIENT_CLINIC_OR_DEPARTMENT_OTHER): Payer: Self-pay

## 2022-08-26 ENCOUNTER — Other Ambulatory Visit: Payer: Self-pay | Admitting: Psychiatry

## 2022-08-26 ENCOUNTER — Telehealth: Payer: Self-pay | Admitting: Psychiatry

## 2022-08-26 ENCOUNTER — Other Ambulatory Visit (HOSPITAL_BASED_OUTPATIENT_CLINIC_OR_DEPARTMENT_OTHER): Payer: Self-pay

## 2022-08-26 DIAGNOSIS — F331 Major depressive disorder, recurrent, moderate: Secondary | ICD-10-CM

## 2022-08-26 DIAGNOSIS — F422 Mixed obsessional thoughts and acts: Secondary | ICD-10-CM

## 2022-08-26 MED ORDER — LORAZEPAM 0.5 MG PO TABS
ORAL_TABLET | ORAL | 0 refills | Status: DC
  Filled 2022-08-26: qty 100, 25d supply, fill #0

## 2022-08-26 MED ORDER — FLUVOXAMINE MALEATE 100 MG PO TABS
ORAL_TABLET | ORAL | 1 refills | Status: DC
  Filled 2022-08-26: qty 225, 90d supply, fill #0

## 2022-08-26 NOTE — Telephone Encounter (Signed)
TC   More anxiety.  More insulin resistance with DM.  Struggling with glucose. DC propranolol Increase fluvoxamine to 100 mg AM and 150 mg HS Lorazepam 0.5-1 mg bID prn until fluvoxamine helps

## 2022-08-29 ENCOUNTER — Other Ambulatory Visit: Payer: Self-pay

## 2022-08-29 ENCOUNTER — Other Ambulatory Visit (HOSPITAL_BASED_OUTPATIENT_CLINIC_OR_DEPARTMENT_OTHER): Payer: Self-pay

## 2022-08-31 ENCOUNTER — Ambulatory Visit: Payer: 59 | Admitting: Psychiatry

## 2022-08-31 ENCOUNTER — Other Ambulatory Visit (HOSPITAL_BASED_OUTPATIENT_CLINIC_OR_DEPARTMENT_OTHER): Payer: Self-pay

## 2022-08-31 MED ORDER — SPIRONOLACTONE 50 MG PO TABS
50.0000 mg | ORAL_TABLET | Freq: Every day | ORAL | 0 refills | Status: AC
  Filled 2022-08-31: qty 90, 90d supply, fill #0

## 2022-08-31 MED ORDER — AMLODIPINE BESYLATE 10 MG PO TABS
10.0000 mg | ORAL_TABLET | Freq: Every day | ORAL | 0 refills | Status: DC
  Filled 2022-08-31: qty 90, 90d supply, fill #0

## 2022-09-02 ENCOUNTER — Other Ambulatory Visit (HOSPITAL_BASED_OUTPATIENT_CLINIC_OR_DEPARTMENT_OTHER): Payer: Self-pay

## 2022-09-12 ENCOUNTER — Other Ambulatory Visit: Payer: Self-pay | Admitting: Nurse Practitioner

## 2022-09-12 ENCOUNTER — Other Ambulatory Visit (HOSPITAL_BASED_OUTPATIENT_CLINIC_OR_DEPARTMENT_OTHER): Payer: Self-pay

## 2022-09-12 DIAGNOSIS — I10 Essential (primary) hypertension: Secondary | ICD-10-CM

## 2022-09-12 MED ORDER — LEVOTHYROXINE SODIUM 100 MCG PO TABS
100.0000 ug | ORAL_TABLET | Freq: Every day | ORAL | 0 refills | Status: DC
  Filled 2022-09-12: qty 30, 30d supply, fill #0

## 2022-09-13 DIAGNOSIS — E063 Autoimmune thyroiditis: Secondary | ICD-10-CM | POA: Diagnosis not present

## 2022-09-13 DIAGNOSIS — E1065 Type 1 diabetes mellitus with hyperglycemia: Secondary | ICD-10-CM | POA: Diagnosis not present

## 2022-09-13 DIAGNOSIS — E038 Other specified hypothyroidism: Secondary | ICD-10-CM | POA: Diagnosis not present

## 2022-09-13 DIAGNOSIS — I10 Essential (primary) hypertension: Secondary | ICD-10-CM | POA: Diagnosis not present

## 2022-09-13 DIAGNOSIS — Z794 Long term (current) use of insulin: Secondary | ICD-10-CM | POA: Diagnosis not present

## 2022-09-15 ENCOUNTER — Encounter: Payer: 59 | Admitting: Nurse Practitioner

## 2022-09-20 ENCOUNTER — Other Ambulatory Visit (HOSPITAL_BASED_OUTPATIENT_CLINIC_OR_DEPARTMENT_OTHER): Payer: Self-pay

## 2022-09-20 MED ORDER — LISINOPRIL 40 MG PO TABS
40.0000 mg | ORAL_TABLET | Freq: Every day | ORAL | 3 refills | Status: AC
  Filled 2022-09-20 – 2023-01-08 (×2): qty 90, 90d supply, fill #0
  Filled 2023-03-01 – 2023-05-11 (×2): qty 90, 90d supply, fill #1
  Filled 2023-08-27: qty 90, 90d supply, fill #2

## 2022-09-29 ENCOUNTER — Other Ambulatory Visit (HOSPITAL_BASED_OUTPATIENT_CLINIC_OR_DEPARTMENT_OTHER): Payer: Self-pay

## 2022-09-30 ENCOUNTER — Other Ambulatory Visit: Payer: Self-pay

## 2022-09-30 ENCOUNTER — Other Ambulatory Visit (HOSPITAL_BASED_OUTPATIENT_CLINIC_OR_DEPARTMENT_OTHER): Payer: Self-pay

## 2022-09-30 DIAGNOSIS — F331 Major depressive disorder, recurrent, moderate: Secondary | ICD-10-CM

## 2022-09-30 MED ORDER — BUPROPION HCL ER (XL) 300 MG PO TB24
300.0000 mg | ORAL_TABLET | Freq: Every day | ORAL | 0 refills | Status: DC
  Filled 2022-09-30 – 2022-10-03 (×2): qty 90, 90d supply, fill #0

## 2022-10-03 ENCOUNTER — Other Ambulatory Visit (HOSPITAL_BASED_OUTPATIENT_CLINIC_OR_DEPARTMENT_OTHER): Payer: Self-pay

## 2022-10-03 ENCOUNTER — Other Ambulatory Visit: Payer: Self-pay

## 2022-10-03 ENCOUNTER — Other Ambulatory Visit: Payer: Self-pay | Admitting: Psychiatry

## 2022-10-03 MED ORDER — LORAZEPAM 0.5 MG PO TABS
ORAL_TABLET | ORAL | 0 refills | Status: DC
  Filled 2022-10-03: qty 100, 25d supply, fill #0

## 2022-10-04 ENCOUNTER — Other Ambulatory Visit: Payer: Self-pay

## 2022-10-04 ENCOUNTER — Other Ambulatory Visit (HOSPITAL_BASED_OUTPATIENT_CLINIC_OR_DEPARTMENT_OTHER): Payer: Self-pay

## 2022-10-06 ENCOUNTER — Other Ambulatory Visit (HOSPITAL_BASED_OUTPATIENT_CLINIC_OR_DEPARTMENT_OTHER): Payer: Self-pay

## 2022-10-07 ENCOUNTER — Other Ambulatory Visit (HOSPITAL_BASED_OUTPATIENT_CLINIC_OR_DEPARTMENT_OTHER): Payer: Self-pay

## 2022-10-12 ENCOUNTER — Other Ambulatory Visit (HOSPITAL_BASED_OUTPATIENT_CLINIC_OR_DEPARTMENT_OTHER): Payer: Self-pay

## 2022-10-12 MED ORDER — LEVOTHYROXINE SODIUM 100 MCG PO TABS
100.0000 ug | ORAL_TABLET | Freq: Every day | ORAL | 0 refills | Status: DC
  Filled 2022-10-12: qty 30, 30d supply, fill #0

## 2022-10-19 ENCOUNTER — Other Ambulatory Visit (HOSPITAL_BASED_OUTPATIENT_CLINIC_OR_DEPARTMENT_OTHER): Payer: Self-pay

## 2022-10-19 ENCOUNTER — Ambulatory Visit (INDEPENDENT_AMBULATORY_CARE_PROVIDER_SITE_OTHER): Payer: Commercial Managed Care - PPO | Admitting: Psychiatry

## 2022-10-19 ENCOUNTER — Encounter: Payer: Self-pay | Admitting: Psychiatry

## 2022-10-19 DIAGNOSIS — F40240 Claustrophobia: Secondary | ICD-10-CM | POA: Diagnosis not present

## 2022-10-19 DIAGNOSIS — F330 Major depressive disorder, recurrent, mild: Secondary | ICD-10-CM | POA: Diagnosis not present

## 2022-10-19 DIAGNOSIS — F422 Mixed obsessional thoughts and acts: Secondary | ICD-10-CM | POA: Diagnosis not present

## 2022-10-19 MED ORDER — FLUVOXAMINE MALEATE 100 MG PO TABS
100.0000 mg | ORAL_TABLET | Freq: Two times a day (BID) | ORAL | 1 refills | Status: DC
  Filled 2022-10-19 – 2023-03-02 (×3): qty 180, 90d supply, fill #0
  Filled 2023-06-02: qty 180, 90d supply, fill #1

## 2022-10-19 MED ORDER — LORAZEPAM 0.5 MG PO TABS
ORAL_TABLET | ORAL | 2 refills | Status: DC
  Filled 2022-10-19: qty 100, fill #0
  Filled 2023-03-01 – 2023-03-02 (×2): qty 100, 25d supply, fill #0

## 2022-10-19 MED ORDER — BUPROPION HCL ER (XL) 300 MG PO TB24
300.0000 mg | ORAL_TABLET | Freq: Every day | ORAL | 1 refills | Status: DC
  Filled 2022-10-19 – 2023-01-08 (×2): qty 90, 90d supply, fill #0
  Filled 2023-03-01 – 2023-05-15 (×2): qty 90, 90d supply, fill #1

## 2022-10-19 NOTE — Progress Notes (Signed)
[name] [id number] [birth year] 62 y.o.  Subjective:   Patient ID:  Julia Holt is a 62 y.o. (DOB [birth year]) adult.  Chief Complaint:  No chief complaint on file.   Depression        Associated symptoms include fatigue.  Associated symptoms include no decreased concentration and no suicidal ideas.  Past medical history includes anxiety.   Anxiety Symptoms include nervous/anxious behavior. Patient reports no confusion, decreased concentration, palpitations or suicidal ideas.     Julia Holt presents to the office today for follow-up of psychiatric diagnoses.  seen March 2020.  Since then she elected to stop Wellbutrin out of concern about potential interaction with propranolol and drug screens at work.  Her mother died 2019-02-02 she had dementia.  At the end she did not recognize her daughter.  It's been really hard.  Misses her.  She called in June asking to increase fluvoxamine to 150 or 200 mg daily.  Started travelling to [place] for work in July and so stressed by the commute.  Exhausted by the end of the day.  Prayed a lot about it.  Feels God told her to resign.  Leaving October 2.  Can't handle the drive anymore.  Anxious driving.  Feels good about the decision.  [name] has driven her some of the time.  Going into job search at [employer].    seen March 06, 2019.  She was still dealing with grief over the loss of her mother.  We made no med change.  She continued fluvoxamine 200 mg daily as well as propranolol 20 mg twice daily as needed anxiety.  seen January 2021.  No med changes.    10/30/19  Appt with the following noted: No job yet.  Still looking, but expects to get hired today.  Got through mother's day OK.  Managing OK.   Taking the propranolol mainly just once daily.  Still struggling over mother's death with holidays.  Ashes in urn with her dad.  All the political and social unrest watching CNN is depressing.   No med changes  04/10/20  appt with the following noted: Doing well.  Some stress with new job and using a little more propranolol and it's been helpful for anxiety.  Taking 20 mg propranolol daily. Plan: Satisfied with the medications.  No change indicated. Continue fluvoxamine 200 mg daily. Propranolol 20 mg prn anxiety.  12/09/2020 appointment with the following noted: A little more tired lately. H low platelets and has been sick.  Stressful.  Up and down is frustrating.  He's tired all the time.  Worries over H.  Wants him to feel better.  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 No SE. Patient reports grief issues but not depression.    Some anxierty and taking the propranolol daily but it helps.  Overall handling things OK.  Patient denies difficulty with sleep initiation or maintenance, but some negative effects temporarily related to grief and stress work.  I get enough sleep.  Denies appetite disturbance.  Patient reports that energy and motivation have been good.  Patient denies any difficulty with concentration.  Patient denies any suicidal ideation. Plan: Satisfied with the medications.  No change indicated. Continue fluvoxamine 200 mg daily. Propranolol 20 mg prn anxiety.  09/09/21 appt noted: On fluvoxamine 100 BID, propranolol 20 mg AM and not weekends.  Sometimes needed at night.   Lost 20# which helped knees.  Thyroid labs mixed up.  From  hyper to hypothyroidism.  Meds are starting to work.  Still tired end of the day, but energy is better.  Not irritable.  Less cold intolerant.   Lost dog to age.  Got another Shitzu. Sleep is good. No SE Minimal OCD.  Some days more anxiety.  Propranolol helps.  06/24/22 appt noted: More down and low energy and not motivated.  Going on a few months. Normal thyroid.   Not much anxiety.  08/26/22  TC  More anxiety.  More insulin resistance with DM.  Struggling with glucose. DC propranolol Increase fluvoxamine to 100 mg AM and 150 mg HS Lorazepam 0.5-1  mg bID prn until fluvoxamine helps      10/19/22 appt noted: Increased fluvoxamine 200 mg daily. Tried lorazepam0.5 mg BID and working very well. Going well with new insulin pump.  Frustrating experience causing crying spells.  It is working . No dep since Wellbutrin.  Anxiety better now mild. No SE. Sleep good     Past Psychiatric Medication Trials:  Fluvoxamine, venlafaxine, Wellbutrin 300 helped, paroxetine 40 wt gain, propranolol  Her last episode of depression occurred in July 2019 and we increase the Wellbutrin to 300 mg and it resolved  Review of Systems:  Review of Systems  Constitutional:  Positive for fatigue.  Cardiovascular:  Negative for palpitations.  Neurological:  Negative for tremors and weakness.  Psychiatric/Behavioral:  Positive for dysphoric mood. Negative for agitation, behavioral problems, confusion, decreased concentration, hallucinations, self-injury, sleep disturbance and suicidal ideas. The patient is nervous/anxious. The patient is not hyperactive.     Medications: I have reviewed the patient's current medications.  Current Outpatient Medications  Medication Sig Dispense Refill   amLODipine (NORVASC) 10 MG tablet TAKE 1 TABLET BY MOUTH ONCE DAILY 90 tablet 3   amLODipine (NORVASC) 10 MG tablet Take 1 tablet (10 mg total) by mouth daily. 90 tablet 0   aspirin 81 MG chewable tablet Chew 1 tablet (81 mg total) by mouth daily.     buPROPion (WELLBUTRIN XL) 300 MG 24 hr tablet Take 1 tablet (300 mg total) by mouth daily. 90 tablet 0   Calcium Carbonate-Vitamin D3 600-400 MG-UNIT TABS Take by mouth 2 (two) times daily.     Continuous Blood Gluc Receiver (DEXCOM G6 RECEIVER) DEVI USE AS DIRECTED TO CHECK BLOOD SUGARS     Continuous Blood Gluc Sensor (DEXCOM G6 SENSOR) MISC Apply topically as directed.     Continuous Blood Gluc Sensor (DEXCOM G6 SENSOR) MISC Use as directed every 10 days 9 each 2   Continuous Blood Gluc Transmit (DEXCOM G6 TRANSMITTER) MISC Use  to check blood sugars as directed 1 each 2   fluvoxaMINE (LUVOX) 100 MG tablet Take 1 tablet in the AM and 1 and 1/2 tablets at night 225 tablet 1   HUMALOG 100 UNIT/ML injection Use up to 60 units daily in insulin pump Maximum Daily Dose=60 units 60 mL 2   Insulin Human (INSULIN PUMP) SOLN Inject into the skin. Husband has no idea what kind of insulin she takes. Husband states she is on a insulin pump. Pharmacy has a record of Novolog 60 units twice daily.     INSULIN SYRINGE 1CC/29G 29G X 1/2" 1 ML MISC Use to inject insulin daily 50 each 1   levothyroxine (SYNTHROID) 100 MCG tablet Take 1 tablet (100 mcg total) by mouth daily. 30 tablet 0   lisinopril (ZESTRIL) 40 MG tablet Take 1 tablet (40 mg total) by mouth daily. 90 tablet 3   LORazepam (  ATIVAN) 0.5 MG tablet Take 1-2 tablets by mouth twice daily as needed for anxiety 100 tablet 0   Multiple Vitamins-Minerals (MULTIVITAMIN ADULTS 50+ PO) Take by mouth.     prednisoLONE acetate (PRED FORTE) 1 % ophthalmic suspension Instill 1 drop into right eye as directed: 1 drop every 2 hours in right eye x 1 day, then 1 drop 4 times a day in right eye for 1 week, then 1 drop right eye twice a day for 1 week 10 mL 0   propranolol (INDERAL) 20 MG tablet Take 1 tablet (20 mg total) by mouth 2 (two) times daily as needed for anxiety. 180 tablet 3   spironolactone (ALDACTONE) 50 MG tablet Take 1 tablet (50 mg total) by mouth daily. Keep appointment on 04/26/22 for future refills. 90 tablet 1   spironolactone (ALDACTONE) 50 MG tablet Take 1 tablet (50 mg total) by mouth daily. 90 tablet 0   No current facility-administered medications for this visit.    Medication Side Effects: None  Allergies:  Allergies  Allergen Reactions   Latex Other (See Comments)   Sulfa Antibiotics Hives and Itching    Past Medical History:  Diagnosis Date   Anxiety    Depression    Diabetes mellitus without complication (HCC)    Hypertension    Thyroid disease      Family History  Problem Relation Age of Onset   Hypertension Mother    Hyperlipidemia Mother    Dementia Mother    Alzheimer's disease Father     Social History   Socioeconomic History   Marital status: Married    Spouse name: Not on file   Number of children: Not on file   Years of education: Not on file   Highest education level: Not on file  Occupational History   Not on file  Tobacco Use   Smoking status: Never   Smokeless tobacco: Never  Vaping Use   Vaping Use: Never used  Substance and Sexual Activity   Alcohol use: No   Drug use: No   Sexual activity: Yes  Other Topics Concern   Not on file  Social History Narrative   Not on file   Social Determinants of Health   Financial Resource Strain: Not on file  Food Insecurity: Not on file  Transportation Needs: Not on file  Physical Activity: Not on file  Stress: Not on file  Social Connections: Not on file  Intimate Partner Violence: Not on file    Past Medical History, Surgical history, Social history, and Family history were reviewed and updated as appropriate.   Please see review of systems for further details on the patient's review from today.   Objective:   Physical Exam:  There were no vitals taken for this visit.  Physical Exam Constitutional:      General: TAMINA CYPHERS is not in acute distress.    Appearance: OZZIE REMMERS is well-developed.  Musculoskeletal:        General: No deformity.  Neurological:     Mental Status: DINISHA CAI is alert and oriented to person, place, and time.     Motor: No tremor.     Coordination: Coordination normal.     Gait: Gait normal.  Psychiatric:        Attention and Perception: BERNITA BECKSTROM does not perceive auditory hallucinations.        Mood and Affect: Mood is not anxious or depressed. Affect is not labile, blunt, angry, tearful or  inappropriate.        Speech: Speech normal. Speech is not slurred.        Behavior: Behavior normal.         Thought Content: Thought content normal. Thought content is not delusional. Thought content does not include homicidal or suicidal ideation. Thought content does not include suicidal plan.        Cognition and Memory: Cognition normal.        Judgment: Judgment normal.     Comments: Insight intact. No auditory or visual hallucinations.  Anxiety is manageable.      Lab Review:     Component Value Date/Time   NA 137 03/17/2022 0910   NA 139 07/31/2020 1540   K 4.2 03/17/2022 0910   CL 102 03/17/2022 0910   CO2 29 03/17/2022 0910   GLUCOSE 108 (H) 03/17/2022 0910   BUN 16 03/17/2022 0910   BUN 18 07/31/2020 1540   CREATININE 0.91 03/17/2022 0910   CALCIUM 9.7 03/17/2022 0910   PROT 7.4 09/10/2021 0857   ALBUMIN 4.7 09/10/2021 0857   AST 24 09/10/2021 0857   ALT 18 09/10/2021 0857   ALKPHOS 80 09/10/2021 0857   BILITOT 0.5 09/10/2021 0857   GFRNONAA 84 07/31/2020 1540   GFRAA 97 07/31/2020 1540       Component Value Date/Time   WBC 6.5 09/10/2021 0857   RBC 4.60 09/10/2021 0857   HGB 14.8 09/10/2021 0857   HGB 14.8 07/31/2020 1540   HCT 43.8 09/10/2021 0857   HCT 44.7 07/31/2020 1540   PLT 241.0 09/10/2021 0857   PLT 237 07/31/2020 1540   MCV 95.2 09/10/2021 0857   MCV 94 07/31/2020 1540   MCH 31.0 07/31/2020 1540   MCH 32.3 06/06/2016 0416   MCHC 33.8 09/10/2021 0857   RDW 12.8 09/10/2021 0857   RDW 11.9 07/31/2020 1540   LYMPHSABS 1.7 06/04/2016 0448   MONOABS 0.6 06/04/2016 0448   EOSABS 0.0 06/04/2016 0448   BASOSABS 0.0 06/04/2016 0448    No results found for: "POCLITH", "LITHIUM"   No results found for: "PHENYTOIN", "PHENOBARB", "VALPROATE", "CBMZ"   .res Assessment: Plan:    No diagnosis found.    Historically her depressive episodes have typically been triggered. Anxiety and Dep controlled.  We discussed the short-term risks associated with benzodiazepines including sedation and increased fall risk among others.  Discussed long-term side effect  risk including dependence, potential withdrawal symptoms, and the potential eventual dose-related risk of dementia.  But recent studies from 2020 dispute this association between benzodiazepines and dementia risk. Newer studies in 2020 do not support an association with dementia.  Thyroid problems are being addressed.  Because it is been helpful continue propranolol low-dose 20 mg twice a day as needed for anxiety.  Discussed the possibility it could mask symptoms of hypoglycemia but at this tiny dose that should be not a problem.  Wellbutrin XL 150 then 300 mg AM.  It worked in the past. Continue fluvoxamine 200 mg daily  .  Increase was helpful. Lorazepam 0.5. She takes it each AM.  Still helps  Disc SE in detail and SSRI withdrawal sx.  Follow-up 6  mos  Meredith Staggers MD, DFAPA   Please see After Visit Summary for patient specific instructions.  Future Appointments  Date Time Provider Department Center  10/19/2022  1:00 PM Cottle, Steva Ready., MD [facility] None  12/15/2022  3:45 PM Rankin, Alford Highland, MD [facility] None    No orders of the defined  types were placed in this encounter.     -------------------------------

## 2022-10-21 DIAGNOSIS — E1065 Type 1 diabetes mellitus with hyperglycemia: Secondary | ICD-10-CM | POA: Diagnosis not present

## 2022-10-21 DIAGNOSIS — E109 Type 1 diabetes mellitus without complications: Secondary | ICD-10-CM | POA: Diagnosis not present

## 2022-10-31 DIAGNOSIS — E1065 Type 1 diabetes mellitus with hyperglycemia: Secondary | ICD-10-CM | POA: Diagnosis not present

## 2022-10-31 DIAGNOSIS — E109 Type 1 diabetes mellitus without complications: Secondary | ICD-10-CM | POA: Diagnosis not present

## 2022-11-15 ENCOUNTER — Other Ambulatory Visit (HOSPITAL_BASED_OUTPATIENT_CLINIC_OR_DEPARTMENT_OTHER): Payer: Self-pay

## 2022-11-15 MED ORDER — LEVOTHYROXINE SODIUM 100 MCG PO TABS
100.0000 ug | ORAL_TABLET | Freq: Every day | ORAL | 0 refills | Status: DC
  Filled 2022-11-15: qty 90, 90d supply, fill #0

## 2022-11-21 ENCOUNTER — Other Ambulatory Visit (HOSPITAL_BASED_OUTPATIENT_CLINIC_OR_DEPARTMENT_OTHER): Payer: Self-pay

## 2022-11-28 ENCOUNTER — Other Ambulatory Visit (HOSPITAL_BASED_OUTPATIENT_CLINIC_OR_DEPARTMENT_OTHER): Payer: Self-pay

## 2022-11-28 MED ORDER — AMLODIPINE BESYLATE 10 MG PO TABS
10.0000 mg | ORAL_TABLET | Freq: Every day | ORAL | 0 refills | Status: DC
  Filled 2022-11-28: qty 90, 90d supply, fill #0

## 2022-12-15 ENCOUNTER — Encounter (INDEPENDENT_AMBULATORY_CARE_PROVIDER_SITE_OTHER): Payer: 59 | Admitting: Ophthalmology

## 2022-12-15 DIAGNOSIS — H2513 Age-related nuclear cataract, bilateral: Secondary | ICD-10-CM | POA: Diagnosis not present

## 2022-12-15 DIAGNOSIS — H43812 Vitreous degeneration, left eye: Secondary | ICD-10-CM | POA: Diagnosis not present

## 2022-12-15 DIAGNOSIS — H43811 Vitreous degeneration, right eye: Secondary | ICD-10-CM | POA: Diagnosis not present

## 2022-12-15 DIAGNOSIS — E109 Type 1 diabetes mellitus without complications: Secondary | ICD-10-CM | POA: Diagnosis not present

## 2023-01-08 ENCOUNTER — Other Ambulatory Visit (HOSPITAL_BASED_OUTPATIENT_CLINIC_OR_DEPARTMENT_OTHER): Payer: Self-pay

## 2023-01-09 ENCOUNTER — Other Ambulatory Visit: Payer: Self-pay

## 2023-01-09 ENCOUNTER — Other Ambulatory Visit (HOSPITAL_BASED_OUTPATIENT_CLINIC_OR_DEPARTMENT_OTHER): Payer: Self-pay

## 2023-01-09 MED ORDER — AMLODIPINE BESYLATE 10 MG PO TABS
10.0000 mg | ORAL_TABLET | Freq: Every day | ORAL | 1 refills | Status: DC
  Filled 2023-01-09 – 2023-03-02 (×3): qty 90, 90d supply, fill #0
  Filled 2023-06-02 (×2): qty 90, 90d supply, fill #1

## 2023-01-09 MED ORDER — LEVOTHYROXINE SODIUM 100 MCG PO TABS
100.0000 ug | ORAL_TABLET | Freq: Every day | ORAL | 1 refills | Status: DC
  Filled 2023-01-09 – 2023-03-02 (×3): qty 90, 90d supply, fill #0
  Filled 2023-06-02: qty 90, 90d supply, fill #1

## 2023-01-20 DIAGNOSIS — E1065 Type 1 diabetes mellitus with hyperglycemia: Secondary | ICD-10-CM | POA: Diagnosis not present

## 2023-01-20 DIAGNOSIS — E109 Type 1 diabetes mellitus without complications: Secondary | ICD-10-CM | POA: Diagnosis not present

## 2023-02-17 DIAGNOSIS — E1065 Type 1 diabetes mellitus with hyperglycemia: Secondary | ICD-10-CM | POA: Diagnosis not present

## 2023-02-17 DIAGNOSIS — Z978 Presence of other specified devices: Secondary | ICD-10-CM | POA: Diagnosis not present

## 2023-02-17 DIAGNOSIS — E063 Autoimmune thyroiditis: Secondary | ICD-10-CM | POA: Diagnosis not present

## 2023-02-17 DIAGNOSIS — E039 Hypothyroidism, unspecified: Secondary | ICD-10-CM | POA: Diagnosis not present

## 2023-02-17 DIAGNOSIS — I10 Essential (primary) hypertension: Secondary | ICD-10-CM | POA: Diagnosis not present

## 2023-02-17 DIAGNOSIS — E038 Other specified hypothyroidism: Secondary | ICD-10-CM | POA: Diagnosis not present

## 2023-03-01 ENCOUNTER — Other Ambulatory Visit (HOSPITAL_BASED_OUTPATIENT_CLINIC_OR_DEPARTMENT_OTHER): Payer: Self-pay

## 2023-03-01 ENCOUNTER — Encounter (HOSPITAL_BASED_OUTPATIENT_CLINIC_OR_DEPARTMENT_OTHER): Payer: Self-pay

## 2023-03-02 ENCOUNTER — Other Ambulatory Visit (HOSPITAL_COMMUNITY): Payer: Self-pay

## 2023-03-02 ENCOUNTER — Other Ambulatory Visit: Payer: Self-pay

## 2023-03-02 ENCOUNTER — Other Ambulatory Visit (HOSPITAL_BASED_OUTPATIENT_CLINIC_OR_DEPARTMENT_OTHER): Payer: Self-pay

## 2023-03-02 MED ORDER — SPIRONOLACTONE 50 MG PO TABS
50.0000 mg | ORAL_TABLET | Freq: Every day | ORAL | 0 refills | Status: DC
  Filled 2023-03-02: qty 90, 90d supply, fill #0

## 2023-03-03 ENCOUNTER — Other Ambulatory Visit (HOSPITAL_BASED_OUTPATIENT_CLINIC_OR_DEPARTMENT_OTHER): Payer: Self-pay

## 2023-03-22 DIAGNOSIS — E109 Type 1 diabetes mellitus without complications: Secondary | ICD-10-CM | POA: Diagnosis not present

## 2023-03-22 DIAGNOSIS — I10 Essential (primary) hypertension: Secondary | ICD-10-CM | POA: Diagnosis not present

## 2023-03-22 DIAGNOSIS — E559 Vitamin D deficiency, unspecified: Secondary | ICD-10-CM | POA: Diagnosis not present

## 2023-03-22 DIAGNOSIS — E782 Mixed hyperlipidemia: Secondary | ICD-10-CM | POA: Diagnosis not present

## 2023-03-22 DIAGNOSIS — E871 Hypo-osmolality and hyponatremia: Secondary | ICD-10-CM | POA: Diagnosis not present

## 2023-03-22 DIAGNOSIS — R29898 Other symptoms and signs involving the musculoskeletal system: Secondary | ICD-10-CM | POA: Diagnosis not present

## 2023-03-22 DIAGNOSIS — E039 Hypothyroidism, unspecified: Secondary | ICD-10-CM | POA: Diagnosis not present

## 2023-03-31 DIAGNOSIS — E039 Hypothyroidism, unspecified: Secondary | ICD-10-CM | POA: Diagnosis not present

## 2023-03-31 DIAGNOSIS — R748 Abnormal levels of other serum enzymes: Secondary | ICD-10-CM | POA: Diagnosis not present

## 2023-03-31 DIAGNOSIS — E871 Hypo-osmolality and hyponatremia: Secondary | ICD-10-CM | POA: Diagnosis not present

## 2023-04-05 DIAGNOSIS — R748 Abnormal levels of other serum enzymes: Secondary | ICD-10-CM | POA: Diagnosis not present

## 2023-04-05 DIAGNOSIS — N289 Disorder of kidney and ureter, unspecified: Secondary | ICD-10-CM | POA: Diagnosis not present

## 2023-04-11 DIAGNOSIS — R748 Abnormal levels of other serum enzymes: Secondary | ICD-10-CM | POA: Diagnosis not present

## 2023-04-13 DIAGNOSIS — E1065 Type 1 diabetes mellitus with hyperglycemia: Secondary | ICD-10-CM | POA: Diagnosis not present

## 2023-04-13 DIAGNOSIS — E109 Type 1 diabetes mellitus without complications: Secondary | ICD-10-CM | POA: Diagnosis not present

## 2023-04-14 ENCOUNTER — Other Ambulatory Visit (HOSPITAL_BASED_OUTPATIENT_CLINIC_OR_DEPARTMENT_OTHER): Payer: Self-pay

## 2023-04-14 MED ORDER — PREDNISONE 10 MG PO TABS
10.0000 mg | ORAL_TABLET | Freq: Every day | ORAL | 0 refills | Status: DC
  Filled 2023-04-14: qty 30, 30d supply, fill #0

## 2023-04-17 DIAGNOSIS — E871 Hypo-osmolality and hyponatremia: Secondary | ICD-10-CM | POA: Diagnosis not present

## 2023-04-24 ENCOUNTER — Ambulatory Visit: Payer: Commercial Managed Care - PPO | Admitting: Psychiatry

## 2023-04-24 DIAGNOSIS — R0602 Shortness of breath: Secondary | ICD-10-CM | POA: Diagnosis not present

## 2023-04-24 DIAGNOSIS — M791 Myalgia, unspecified site: Secondary | ICD-10-CM | POA: Diagnosis not present

## 2023-04-24 DIAGNOSIS — M6281 Muscle weakness (generalized): Secondary | ICD-10-CM | POA: Diagnosis not present

## 2023-04-25 NOTE — Progress Notes (Signed)
Appt cancelled DT conflicts per pt

## 2023-05-09 DIAGNOSIS — Z9641 Presence of insulin pump (external) (internal): Secondary | ICD-10-CM | POA: Diagnosis not present

## 2023-05-09 DIAGNOSIS — E039 Hypothyroidism, unspecified: Secondary | ICD-10-CM | POA: Diagnosis not present

## 2023-05-09 DIAGNOSIS — I10 Essential (primary) hypertension: Secondary | ICD-10-CM | POA: Diagnosis not present

## 2023-05-09 DIAGNOSIS — E1065 Type 1 diabetes mellitus with hyperglycemia: Secondary | ICD-10-CM | POA: Diagnosis not present

## 2023-05-11 ENCOUNTER — Other Ambulatory Visit (HOSPITAL_BASED_OUTPATIENT_CLINIC_OR_DEPARTMENT_OTHER): Payer: Self-pay

## 2023-05-15 ENCOUNTER — Other Ambulatory Visit (HOSPITAL_BASED_OUTPATIENT_CLINIC_OR_DEPARTMENT_OTHER): Payer: Self-pay

## 2023-05-26 DIAGNOSIS — I10 Essential (primary) hypertension: Secondary | ICD-10-CM | POA: Diagnosis not present

## 2023-05-26 DIAGNOSIS — R748 Abnormal levels of other serum enzymes: Secondary | ICD-10-CM | POA: Diagnosis not present

## 2023-05-26 DIAGNOSIS — Q998 Other specified chromosome abnormalities: Secondary | ICD-10-CM | POA: Diagnosis not present

## 2023-05-26 DIAGNOSIS — R29898 Other symptoms and signs involving the musculoskeletal system: Secondary | ICD-10-CM | POA: Diagnosis not present

## 2023-06-01 DIAGNOSIS — Z9641 Presence of insulin pump (external) (internal): Secondary | ICD-10-CM | POA: Diagnosis not present

## 2023-06-01 DIAGNOSIS — R29898 Other symptoms and signs involving the musculoskeletal system: Secondary | ICD-10-CM | POA: Diagnosis not present

## 2023-06-01 DIAGNOSIS — R531 Weakness: Secondary | ICD-10-CM | POA: Diagnosis not present

## 2023-06-01 DIAGNOSIS — Q998 Other specified chromosome abnormalities: Secondary | ICD-10-CM | POA: Diagnosis not present

## 2023-06-02 ENCOUNTER — Other Ambulatory Visit: Payer: Self-pay

## 2023-06-02 ENCOUNTER — Other Ambulatory Visit: Payer: Self-pay | Admitting: Psychiatry

## 2023-06-02 ENCOUNTER — Other Ambulatory Visit (HOSPITAL_BASED_OUTPATIENT_CLINIC_OR_DEPARTMENT_OTHER): Payer: Self-pay

## 2023-06-02 DIAGNOSIS — F40240 Claustrophobia: Secondary | ICD-10-CM

## 2023-06-02 DIAGNOSIS — F422 Mixed obsessional thoughts and acts: Secondary | ICD-10-CM

## 2023-06-02 DIAGNOSIS — F330 Major depressive disorder, recurrent, mild: Secondary | ICD-10-CM

## 2023-06-02 MED ORDER — BUPROPION HCL ER (XL) 300 MG PO TB24
300.0000 mg | ORAL_TABLET | Freq: Every day | ORAL | 1 refills | Status: DC
  Filled 2023-06-02: qty 90, 90d supply, fill #0

## 2023-06-02 MED ORDER — PROPRANOLOL HCL 20 MG PO TABS
20.0000 mg | ORAL_TABLET | Freq: Two times a day (BID) | ORAL | 0 refills | Status: DC
  Filled 2023-06-02: qty 180, 90d supply, fill #0

## 2023-06-02 MED ORDER — LORAZEPAM 0.5 MG PO TABS
0.5000 mg | ORAL_TABLET | Freq: Two times a day (BID) | ORAL | 2 refills | Status: DC
  Filled 2023-06-02: qty 100, 25d supply, fill #0

## 2023-06-02 NOTE — Telephone Encounter (Signed)
LF 09/12; LV 05/1

## 2023-06-08 ENCOUNTER — Other Ambulatory Visit (HOSPITAL_BASED_OUTPATIENT_CLINIC_OR_DEPARTMENT_OTHER): Payer: Self-pay

## 2023-06-08 MED ORDER — HUMALOG 100 UNIT/ML IJ SOLN
60.0000 [IU] | Freq: Every day | INTRAMUSCULAR | 2 refills | Status: DC
  Filled 2023-08-23: qty 20, 33d supply, fill #0
  Filled 2023-09-22: qty 20, 33d supply, fill #1
  Filled 2023-12-13: qty 20, 33d supply, fill #2
  Filled 2024-02-08: qty 20, 33d supply, fill #3
  Filled 2024-03-07: qty 10, 16d supply, fill #4
  Filled 2024-03-07: qty 50, 83d supply, fill #4
  Filled 2024-03-07: qty 40, 67d supply, fill #4

## 2023-06-26 ENCOUNTER — Ambulatory Visit: Payer: Commercial Managed Care - PPO | Admitting: Psychiatry

## 2023-06-26 ENCOUNTER — Other Ambulatory Visit (HOSPITAL_BASED_OUTPATIENT_CLINIC_OR_DEPARTMENT_OTHER): Payer: Self-pay

## 2023-06-26 ENCOUNTER — Encounter: Payer: Self-pay | Admitting: Psychiatry

## 2023-06-26 DIAGNOSIS — F330 Major depressive disorder, recurrent, mild: Secondary | ICD-10-CM

## 2023-06-26 DIAGNOSIS — F40240 Claustrophobia: Secondary | ICD-10-CM | POA: Diagnosis not present

## 2023-06-26 DIAGNOSIS — F422 Mixed obsessional thoughts and acts: Secondary | ICD-10-CM

## 2023-06-26 MED ORDER — LORAZEPAM 0.5 MG PO TABS
0.5000 mg | ORAL_TABLET | Freq: Two times a day (BID) | ORAL | 4 refills | Status: AC
  Filled 2023-06-26: qty 100, 25d supply, fill #0
  Filled 2023-08-23: qty 100, 25d supply, fill #1
  Filled 2023-09-22: qty 100, 25d supply, fill #2

## 2023-06-26 MED ORDER — FLUVOXAMINE MALEATE 100 MG PO TABS
100.0000 mg | ORAL_TABLET | Freq: Two times a day (BID) | ORAL | 1 refills | Status: DC
  Filled 2023-06-26: qty 180, 90d supply, fill #0

## 2023-06-26 MED ORDER — PROPRANOLOL HCL 20 MG PO TABS
20.0000 mg | ORAL_TABLET | Freq: Two times a day (BID) | ORAL | 1 refills | Status: AC
  Filled 2023-06-26 – 2024-01-14 (×2): qty 180, 90d supply, fill #0
  Filled 2024-04-18: qty 180, 90d supply, fill #1

## 2023-06-26 MED ORDER — BUPROPION HCL ER (XL) 300 MG PO TB24
300.0000 mg | ORAL_TABLET | Freq: Every day | ORAL | 1 refills | Status: DC
  Filled 2023-06-26 – 2023-08-23 (×2): qty 90, 90d supply, fill #0
  Filled 2024-01-01: qty 90, 90d supply, fill #1

## 2023-06-26 NOTE — Progress Notes (Signed)
 [name] [id number] 1960-10-10 63 y.o.  Subjective:   Patient ID:  Julia Holt is a 63 y.o. (DOB April 12, 1961) adult.  Chief Complaint:  Chief Complaint  Patient presents with   Follow-up   Anxiety    Depression        Associated symptoms include fatigue.  Associated symptoms include no decreased concentration and no suicidal ideas.  Past medical history includes anxiety.   Anxiety Symptoms include nervous/anxious behavior. Patient reports no confusion, decreased concentration, palpitations or suicidal ideas.     Julia Holt presents to the office today for follow-up of psychiatric diagnoses.  seen March 2020.  Since then she elected to stop Wellbutrin  out of concern about potential interaction with propranolol  and drug screens at work.  Her mother died January 24, 2019 she had dementia.  At the end she did not recognize her daughter.  It's been really hard.  Misses her.  She called in June asking to increase fluvoxamine  to 150 or 200 mg daily.  Started travelling to [place] for work in July and so stressed by the commute.  Exhausted by the end of the day.  Prayed a lot about it.  Feels God told her to resign.  Leaving October 2.  Can't handle the drive anymore.  Anxious driving.  Feels good about the decision.  [name] has driven her some of the time.  Going into job search at [employer].    seen March 06, 2019.  She was still dealing with grief over the loss of her mother.  We made no med change.  She continued fluvoxamine  200 mg daily as well as propranolol  20 mg twice daily as needed anxiety.  seen January 2021.  No med changes.    10/30/19  Appt with the following noted: No job yet.  Still looking, but expects to get hired today.  Got through mother's day OK.  Managing OK.   Taking the propranolol  mainly just once daily.  Still struggling over mother's death with holidays.  Ashes in urn with her dad.  All the political and social unrest watching [employer] is  depressing.   No med changes  04/10/20 appt with the following noted: Doing well.  Some stress with new job and using a little more propranolol  and it's been helpful for anxiety.  Taking 20 mg propranolol  daily. Plan: Satisfied with the medications.  No change indicated. Continue fluvoxamine  200 mg daily. Propranolol  20 mg prn anxiety.  12/09/2020 appointment with the following noted: A little more tired lately. H low platelets and has been sick.  Stressful.  Up and down is frustrating.  He's tired all the time.  Worries over H.  Wants him to feel better.  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 No SE. Patient reports grief issues but not depression.    Some anxierty and taking the propranolol  daily but it helps.  Overall handling things OK.  Patient denies difficulty with sleep initiation or maintenance, but some negative effects temporarily related to grief and stress work.  I get enough sleep.  Denies appetite disturbance.  Patient reports that energy and motivation have been good.  Patient denies any difficulty with concentration.  Patient denies any suicidal ideation. Plan: Satisfied with the medications.  No change indicated. Continue fluvoxamine  200 mg daily. Propranolol  20 mg prn anxiety.  09/09/21 appt noted: On fluvoxamine  100 BID, propranolol  20 mg AM and not weekends.  Sometimes needed at night.   Lost 20# which helped  knees.  Thyroid  labs mixed up.  From hyper to hypothyroidism.  Meds are starting to work.  Still tired end of the day, but energy is better.  Not irritable.  Less cold intolerant.   Lost dog to age.  Got another Shitzu. Sleep is good. No SE Minimal OCD.  Some days more anxiety.  Propranolol  helps.  06/24/22 appt noted: More down and low energy and not motivated.  Going on a few months. Normal thyroid .   Not much anxiety.  08/26/22  TC  More anxiety.  More insulin  resistance with DM.  Struggling with glucose. DC propranolol  Increase fluvoxamine  to  100 mg AM and 150 mg HS Lorazepam  0.5-1 mg bID prn until fluvoxamine  helps     10/19/22 appt noted: Increased fluvoxamine  200 mg daily. Tried lorazepam0.5 mg BID and working very well. Going well with new insulin  pump.  Frustrating experience causing crying spells.  It is working . No dep since Wellbutrin .  Anxiety better now mild. No SE. Sleep good  06/26/23 appt noted: Psych med:  fluvoxamine  100 BID, Wellbutrin  XL 300 AM, lorazepam  0.5 mg TID prn usually 1 daily and really helps, propranolol  20 BID. DX with leg pain and elevated CK levels.  Referred to rheum and neuro.  Good days and bad days with pain and weakness.  Pending MRI and nerve conduction study.   Thinks mental health is overall fine.  Not knowing what's going on.   No SE.   Not markedly depressed.    Past Psychiatric Medication Trials:  Fluvoxamine  200, venlafaxine, Wellbutrin  300 helped, paroxetine 40 wt gain, propranolol  , lorazepam   Her last episode of depression occurred in July 2019 and we increase the Wellbutrin  to 300 mg and it resolved  Review of Systems:  Review of Systems  Constitutional:  Positive for fatigue.  Cardiovascular:  Negative for palpitations.  Neurological:  Negative for tremors and weakness.  Psychiatric/Behavioral:  Positive for dysphoric mood. Negative for agitation, behavioral problems, confusion, decreased concentration, hallucinations, self-injury, sleep disturbance and suicidal ideas. The patient is nervous/anxious. The patient is not hyperactive.     Medications: I have reviewed the patient's current medications.  Current Outpatient Medications  Medication Sig Dispense Refill   amLODipine  (NORVASC ) 10 MG tablet Take 1 tablet (10 mg total) by mouth daily. 90 tablet 1   aspirin  81 MG chewable tablet Chew 1 tablet (81 mg total) by mouth daily.     buPROPion  (WELLBUTRIN  XL) 300 MG 24 hr tablet Take 1 tablet (300 mg total) by mouth daily. 90 tablet 1   Calcium Carbonate-Vitamin D3 600-400  MG-UNIT TABS Take by mouth 2 (two) times daily.     Continuous Blood Gluc Receiver (DEXCOM G6 RECEIVER) DEVI USE AS DIRECTED TO CHECK BLOOD SUGARS     Continuous Blood Gluc Sensor (DEXCOM G6 SENSOR) MISC Apply topically as directed.     Continuous Glucose Transmitter (DEXCOM G6 TRANSMITTER) MISC Use to check blood sugars as directed 1 each 2   fluvoxaMINE  (LUVOX ) 100 MG tablet Take 1 tablet (100 mg total) by mouth 2 (two) times daily. 180 tablet 1   HUMALOG  100 UNIT/ML injection Use up to 60 units daily in insulin  pump Maximum Daily Dose=60 units 60 mL 2   Insulin  Human (INSULIN  PUMP) SOLN Inject into the skin. Husband has no idea what kind of insulin  she takes. Husband states she is on a insulin  pump. Pharmacy has a record of Novolog  60 units twice daily.     INSULIN  SYRINGE 1CC/29G 29G X  1/2 1 ML MISC Use to inject insulin  daily 50 each 1   levothyroxine  (SYNTHROID ) 100 MCG tablet Take 1 tablet (100 mcg total) by mouth daily. 90 tablet 1   lisinopril  (ZESTRIL ) 40 MG tablet Take 1 tablet (40 mg total) by mouth daily. 90 tablet 3   LORazepam  (ATIVAN ) 0.5 MG tablet Take 1-2 tablets (0.5-1 mg total) by mouth 2 (two) times daily as needed for anxiety 100 tablet 2   Multiple Vitamins-Minerals (MULTIVITAMIN ADULTS 50+ PO) Take by mouth.     prednisoLONE  acetate (PRED FORTE ) 1 % ophthalmic suspension Instill 1 drop into right eye as directed: 1 drop every 2 hours in right eye x 1 day, then 1 drop 4 times a day in right eye for 1 week, then 1 drop right eye twice a day for 1 week 10 mL 0   propranolol  (INDERAL ) 20 MG tablet Take 1 tablet (20 mg total) by mouth 2 (two) times daily. 180 tablet 0   Continuous Glucose Sensor (DEXCOM G6 SENSOR) MISC Use as directed every 10 days 9 each 2   spironolactone  (ALDACTONE ) 50 MG tablet Take 1 tablet (50 mg total) by mouth daily. Keep appointment on 04/26/22 for future refills. (Patient not taking: Reported on 06/26/2023) 90 tablet 1   spironolactone  (ALDACTONE ) 50 MG  tablet Take 1 tablet (50 mg total) by mouth daily. (Patient not taking: Reported on 06/26/2023) 90 tablet 0   No current facility-administered medications for this visit.    Medication Side Effects: None  Allergies:  Allergies  Allergen Reactions   Latex Other (See Comments)   Sulfa Antibiotics Hives and Itching    Past Medical History:  Diagnosis Date   Anxiety    Depression    Diabetes mellitus without complication (HCC)    Hypertension    Thyroid  disease     Family History  Problem Relation Age of Onset   Hypertension Mother    Hyperlipidemia Mother    Dementia Mother    Alzheimer's disease Father     Social History   Socioeconomic History   Marital status: Married    Spouse name: Not on file   Number of children: Not on file   Years of education: Not on file   Highest education level: Not on file  Occupational History   Not on file  Tobacco Use   Smoking status: Never   Smokeless tobacco: Never  Vaping Use   Vaping status: Never Used  Substance and Sexual Activity   Alcohol use: No   Drug use: No   Sexual activity: Yes  Other Topics Concern   Not on file  Social History Narrative   Not on file   Social Drivers of Health   Financial Resource Strain: Not on file  Food Insecurity: Unknown (03/19/2023)   Received from [facility]   Hunger Vital Sign    Worried About Running Out of Food in the Last Year: Patient declined to answer    Ran Out of Food in the Last Year: Patient declined to answer  Transportation Needs: Not on file (03/19/2023)  Physical Activity: Not on file  Stress: Not on file  Social Connections: Not on file  Intimate Partner Violence: Not on file    Past Medical History, Surgical history, Social history, and Family history were reviewed and updated as appropriate.   Please see review of systems for further details on the patient's review from today.   Objective:   Physical Exam:  There were no vitals taken  for this  visit.  Physical Exam Constitutional:      General: Chane is not in acute distress.    Appearance: Leigh is well-developed.  Musculoskeletal:        General: No deformity.  Neurological:     Mental Status: Aleksia is alert and oriented to person, place, and time.     Motor: No tremor.     Coordination: Coordination normal.     Gait: Gait normal.  Psychiatric:        Attention and Perception: Revia does not perceive auditory hallucinations.        Mood and Affect: Mood is not anxious or depressed. Affect is not labile, blunt, angry, tearful or inappropriate.        Speech: Speech normal. Speech is not slurred.        Behavior: Behavior normal.        Thought Content: Thought content normal. Thought content is not delusional. Thought content does not include homicidal or suicidal ideation. Thought content does not include suicidal plan.        Cognition and Memory: Cognition normal.        Judgment: Judgment normal.     Comments: Insight intact. No auditory or visual hallucinations.  Anxiety is manageable.      Lab Review:     Component Value Date/Time   NA 137 03/17/2022 0910   NA 139 07/31/2020 1540   K 4.2 03/17/2022 0910   CL 102 03/17/2022 0910   CO2 29 03/17/2022 0910   GLUCOSE 108 (H) 03/17/2022 0910   BUN 16 03/17/2022 0910   BUN 18 07/31/2020 1540   CREATININE 0.91 03/17/2022 0910   CALCIUM 9.7 03/17/2022 0910   PROT 7.4 09/10/2021 0857   ALBUMIN 4.7 09/10/2021 0857   AST 24 09/10/2021 0857   ALT 18 09/10/2021 0857   ALKPHOS 80 09/10/2021 0857   BILITOT 0.5 09/10/2021 0857   GFRNONAA 84 07/31/2020 1540   GFRAA 97 07/31/2020 1540       Component Value Date/Time   WBC 6.5 09/10/2021 0857   RBC 4.60 09/10/2021 0857   HGB 14.8 09/10/2021 0857   HGB 14.8 07/31/2020 1540   HCT 43.8 09/10/2021 0857   HCT 44.7 07/31/2020 1540   PLT 241.0 09/10/2021 0857   PLT 237 07/31/2020 1540   MCV 95.2 09/10/2021 0857   MCV 94 07/31/2020 1540   MCH 31.0 07/31/2020 1540    MCH 32.3 06/06/2016 0416   MCHC 33.8 09/10/2021 0857   RDW 12.8 09/10/2021 0857   RDW 11.9 07/31/2020 1540   LYMPHSABS 1.7 06/04/2016 0448   MONOABS 0.6 06/04/2016 0448   EOSABS 0.0 06/04/2016 0448   BASOSABS 0.0 06/04/2016 0448    No results found for: POCLITH, LITHIUM   No results found for: PHENYTOIN, PHENOBARB, VALPROATE, CBMZ   .res Assessment: Plan:    Depression, major, recurrent, mild (HCC)  Mixed obsessional thoughts and acts  Claustrophobia    Historically her depressive episodes have typically been triggered. Anxiety and Dep controlled.  We discussed the short-term risks associated with benzodiazepines including sedation and increased fall risk among others.  Discussed long-term side effect risk including dependence, potential withdrawal symptoms, and the potential eventual dose-related risk of dementia.  But recent studies from 2020 dispute this association between benzodiazepines and dementia risk. Newer studies in 2020 do not support an association with dementia.  Thyroid  problems are being addressed.  Because it is been helpful continue propranolol  low-dose 20 mg twice a day as  needed for anxiety.  Discussed the possibility it could mask symptoms of hypoglycemia but at this tiny dose that should be not a problem.  Wellbutrin  XL 150 then 300 mg AM.  It worked in the past and is helpful Continue fluvoxamine  200 mg daily  .  Increase was helpful. Lorazepam  0.5. She takes it each AM.  Still helps  Disc SE in detail and SSRI withdrawal sx.  No med changes  Follow-up 6  mos  Lorene Macintosh MD, DFAPA   Please see After Visit Summary for patient specific instructions.  No future appointments.   No orders of the defined types were placed in this encounter.     -------------------------------

## 2023-07-03 DIAGNOSIS — E109 Type 1 diabetes mellitus without complications: Secondary | ICD-10-CM | POA: Diagnosis not present

## 2023-07-03 DIAGNOSIS — E1065 Type 1 diabetes mellitus with hyperglycemia: Secondary | ICD-10-CM | POA: Diagnosis not present

## 2023-07-25 DIAGNOSIS — Z978 Presence of other specified devices: Secondary | ICD-10-CM | POA: Diagnosis not present

## 2023-07-25 DIAGNOSIS — E039 Hypothyroidism, unspecified: Secondary | ICD-10-CM | POA: Diagnosis not present

## 2023-07-25 DIAGNOSIS — E109 Type 1 diabetes mellitus without complications: Secondary | ICD-10-CM | POA: Diagnosis not present

## 2023-07-25 DIAGNOSIS — Z9641 Presence of insulin pump (external) (internal): Secondary | ICD-10-CM | POA: Diagnosis not present

## 2023-07-25 DIAGNOSIS — I10 Essential (primary) hypertension: Secondary | ICD-10-CM | POA: Diagnosis not present

## 2023-07-25 NOTE — Progress Notes (Signed)
 This encounter was created in error - please disregard.

## 2023-08-04 DIAGNOSIS — M6281 Muscle weakness (generalized): Secondary | ICD-10-CM | POA: Diagnosis not present

## 2023-08-11 ENCOUNTER — Other Ambulatory Visit (HOSPITAL_BASED_OUTPATIENT_CLINIC_OR_DEPARTMENT_OTHER): Payer: Self-pay

## 2023-08-11 MED ORDER — LORAZEPAM 1 MG PO TABS
1.0000 mg | ORAL_TABLET | Freq: Every day | ORAL | 0 refills | Status: DC | PRN
  Filled 2023-08-11: qty 1, 1d supply, fill #0

## 2023-08-14 DIAGNOSIS — M1711 Unilateral primary osteoarthritis, right knee: Secondary | ICD-10-CM | POA: Diagnosis not present

## 2023-08-14 DIAGNOSIS — M25451 Effusion, right hip: Secondary | ICD-10-CM | POA: Diagnosis not present

## 2023-08-14 DIAGNOSIS — M1611 Unilateral primary osteoarthritis, right hip: Secondary | ICD-10-CM | POA: Diagnosis not present

## 2023-08-22 DIAGNOSIS — Z1231 Encounter for screening mammogram for malignant neoplasm of breast: Secondary | ICD-10-CM | POA: Diagnosis not present

## 2023-08-23 ENCOUNTER — Other Ambulatory Visit (HOSPITAL_BASED_OUTPATIENT_CLINIC_OR_DEPARTMENT_OTHER): Payer: Self-pay

## 2023-08-23 ENCOUNTER — Other Ambulatory Visit: Payer: Self-pay

## 2023-08-23 MED ORDER — AMLODIPINE BESYLATE 10 MG PO TABS
10.0000 mg | ORAL_TABLET | Freq: Every day | ORAL | 1 refills | Status: DC
  Filled 2023-08-23 (×2): qty 90, 90d supply, fill #0
  Filled 2024-01-01: qty 90, 90d supply, fill #1

## 2023-08-23 MED ORDER — LEVOTHYROXINE SODIUM 100 MCG PO TABS
100.0000 ug | ORAL_TABLET | Freq: Every day | ORAL | 1 refills | Status: DC
  Filled 2023-08-23 (×2): qty 90, 90d supply, fill #0

## 2023-08-25 ENCOUNTER — Other Ambulatory Visit (HOSPITAL_BASED_OUTPATIENT_CLINIC_OR_DEPARTMENT_OTHER): Payer: Self-pay

## 2023-08-25 MED ORDER — AMOXICILLIN 500 MG PO CAPS
2000.0000 mg | ORAL_CAPSULE | ORAL | 1 refills | Status: AC
  Filled 2023-08-25: qty 16, 4d supply, fill #0

## 2023-08-29 DIAGNOSIS — M1711 Unilateral primary osteoarthritis, right knee: Secondary | ICD-10-CM | POA: Diagnosis not present

## 2023-08-29 DIAGNOSIS — M170 Bilateral primary osteoarthritis of knee: Secondary | ICD-10-CM | POA: Diagnosis not present

## 2023-09-07 ENCOUNTER — Ambulatory Visit: Admitting: Orthopaedic Surgery

## 2023-09-08 ENCOUNTER — Other Ambulatory Visit (HOSPITAL_BASED_OUTPATIENT_CLINIC_OR_DEPARTMENT_OTHER): Payer: Self-pay

## 2023-09-22 ENCOUNTER — Other Ambulatory Visit (HOSPITAL_BASED_OUTPATIENT_CLINIC_OR_DEPARTMENT_OTHER): Payer: Self-pay

## 2023-09-25 DIAGNOSIS — E1065 Type 1 diabetes mellitus with hyperglycemia: Secondary | ICD-10-CM | POA: Diagnosis not present

## 2023-09-25 DIAGNOSIS — E109 Type 1 diabetes mellitus without complications: Secondary | ICD-10-CM | POA: Diagnosis not present

## 2023-10-25 DIAGNOSIS — Z0000 Encounter for general adult medical examination without abnormal findings: Secondary | ICD-10-CM | POA: Diagnosis not present

## 2023-10-25 DIAGNOSIS — I10 Essential (primary) hypertension: Secondary | ICD-10-CM | POA: Diagnosis not present

## 2023-10-25 DIAGNOSIS — R29898 Other symptoms and signs involving the musculoskeletal system: Secondary | ICD-10-CM | POA: Diagnosis not present

## 2023-10-25 DIAGNOSIS — Z13820 Encounter for screening for osteoporosis: Secondary | ICD-10-CM | POA: Diagnosis not present

## 2023-10-25 DIAGNOSIS — Z01818 Encounter for other preprocedural examination: Secondary | ICD-10-CM | POA: Diagnosis not present

## 2023-10-25 DIAGNOSIS — E039 Hypothyroidism, unspecified: Secondary | ICD-10-CM | POA: Diagnosis not present

## 2023-10-25 DIAGNOSIS — Z780 Asymptomatic menopausal state: Secondary | ICD-10-CM | POA: Diagnosis not present

## 2023-10-25 DIAGNOSIS — M8589 Other specified disorders of bone density and structure, multiple sites: Secondary | ICD-10-CM | POA: Diagnosis not present

## 2023-10-25 DIAGNOSIS — E1065 Type 1 diabetes mellitus with hyperglycemia: Secondary | ICD-10-CM | POA: Diagnosis not present

## 2023-10-26 DIAGNOSIS — R262 Difficulty in walking, not elsewhere classified: Secondary | ICD-10-CM | POA: Diagnosis not present

## 2023-10-26 DIAGNOSIS — R29898 Other symptoms and signs involving the musculoskeletal system: Secondary | ICD-10-CM | POA: Diagnosis not present

## 2023-10-26 DIAGNOSIS — M1711 Unilateral primary osteoarthritis, right knee: Secondary | ICD-10-CM | POA: Diagnosis not present

## 2023-10-26 DIAGNOSIS — M25561 Pain in right knee: Secondary | ICD-10-CM | POA: Diagnosis not present

## 2023-10-26 DIAGNOSIS — R2689 Other abnormalities of gait and mobility: Secondary | ICD-10-CM | POA: Diagnosis not present

## 2023-10-30 ENCOUNTER — Other Ambulatory Visit (HOSPITAL_BASED_OUTPATIENT_CLINIC_OR_DEPARTMENT_OTHER): Payer: Self-pay

## 2023-10-30 DIAGNOSIS — I10 Essential (primary) hypertension: Secondary | ICD-10-CM | POA: Diagnosis not present

## 2023-10-30 DIAGNOSIS — E1065 Type 1 diabetes mellitus with hyperglycemia: Secondary | ICD-10-CM | POA: Diagnosis not present

## 2023-10-30 DIAGNOSIS — Z9641 Presence of insulin pump (external) (internal): Secondary | ICD-10-CM | POA: Diagnosis not present

## 2023-10-30 DIAGNOSIS — Z794 Long term (current) use of insulin: Secondary | ICD-10-CM | POA: Diagnosis not present

## 2023-10-30 DIAGNOSIS — E039 Hypothyroidism, unspecified: Secondary | ICD-10-CM | POA: Diagnosis not present

## 2023-10-30 MED ORDER — LEVOTHYROXINE SODIUM 112 MCG PO TABS
112.0000 ug | ORAL_TABLET | Freq: Every morning | ORAL | 1 refills | Status: DC
  Filled 2023-10-30: qty 30, 30d supply, fill #0
  Filled 2023-12-13: qty 30, 30d supply, fill #1
  Filled 2024-01-01: qty 90, 90d supply, fill #1
  Filled 2024-04-18: qty 60, 60d supply, fill #2

## 2023-10-31 ENCOUNTER — Other Ambulatory Visit (HOSPITAL_BASED_OUTPATIENT_CLINIC_OR_DEPARTMENT_OTHER): Payer: Self-pay

## 2023-11-09 ENCOUNTER — Other Ambulatory Visit (HOSPITAL_BASED_OUTPATIENT_CLINIC_OR_DEPARTMENT_OTHER): Payer: Self-pay

## 2023-11-22 DIAGNOSIS — R748 Abnormal levels of other serum enzymes: Secondary | ICD-10-CM | POA: Diagnosis not present

## 2023-11-22 DIAGNOSIS — E1065 Type 1 diabetes mellitus with hyperglycemia: Secondary | ICD-10-CM | POA: Diagnosis not present

## 2023-11-22 DIAGNOSIS — R413 Other amnesia: Secondary | ICD-10-CM | POA: Diagnosis not present

## 2023-11-22 DIAGNOSIS — R319 Hematuria, unspecified: Secondary | ICD-10-CM | POA: Diagnosis not present

## 2023-11-22 DIAGNOSIS — I10 Essential (primary) hypertension: Secondary | ICD-10-CM | POA: Diagnosis not present

## 2023-11-30 DIAGNOSIS — M25561 Pain in right knee: Secondary | ICD-10-CM | POA: Diagnosis not present

## 2023-11-30 DIAGNOSIS — E109 Type 1 diabetes mellitus without complications: Secondary | ICD-10-CM | POA: Diagnosis not present

## 2023-11-30 DIAGNOSIS — M1711 Unilateral primary osteoarthritis, right knee: Secondary | ICD-10-CM | POA: Diagnosis not present

## 2023-11-30 DIAGNOSIS — E1065 Type 1 diabetes mellitus with hyperglycemia: Secondary | ICD-10-CM | POA: Diagnosis not present

## 2023-11-30 DIAGNOSIS — R29898 Other symptoms and signs involving the musculoskeletal system: Secondary | ICD-10-CM | POA: Diagnosis not present

## 2023-11-30 DIAGNOSIS — R262 Difficulty in walking, not elsewhere classified: Secondary | ICD-10-CM | POA: Diagnosis not present

## 2023-11-30 DIAGNOSIS — R2689 Other abnormalities of gait and mobility: Secondary | ICD-10-CM | POA: Diagnosis not present

## 2023-12-07 DIAGNOSIS — M1711 Unilateral primary osteoarthritis, right knee: Secondary | ICD-10-CM | POA: Diagnosis not present

## 2023-12-13 ENCOUNTER — Other Ambulatory Visit (HOSPITAL_BASED_OUTPATIENT_CLINIC_OR_DEPARTMENT_OTHER): Payer: Self-pay

## 2023-12-14 DIAGNOSIS — E1065 Type 1 diabetes mellitus with hyperglycemia: Secondary | ICD-10-CM | POA: Diagnosis not present

## 2023-12-14 DIAGNOSIS — E109 Type 1 diabetes mellitus without complications: Secondary | ICD-10-CM | POA: Diagnosis not present

## 2023-12-26 ENCOUNTER — Other Ambulatory Visit (HOSPITAL_BASED_OUTPATIENT_CLINIC_OR_DEPARTMENT_OTHER): Payer: Self-pay

## 2023-12-28 DIAGNOSIS — R29898 Other symptoms and signs involving the musculoskeletal system: Secondary | ICD-10-CM | POA: Diagnosis not present

## 2023-12-28 DIAGNOSIS — M25561 Pain in right knee: Secondary | ICD-10-CM | POA: Diagnosis not present

## 2023-12-28 DIAGNOSIS — M8589 Other specified disorders of bone density and structure, multiple sites: Secondary | ICD-10-CM | POA: Diagnosis not present

## 2023-12-28 DIAGNOSIS — M1711 Unilateral primary osteoarthritis, right knee: Secondary | ICD-10-CM | POA: Diagnosis not present

## 2023-12-28 DIAGNOSIS — R2689 Other abnormalities of gait and mobility: Secondary | ICD-10-CM | POA: Diagnosis not present

## 2023-12-28 DIAGNOSIS — Z780 Asymptomatic menopausal state: Secondary | ICD-10-CM | POA: Diagnosis not present

## 2023-12-28 DIAGNOSIS — Z13820 Encounter for screening for osteoporosis: Secondary | ICD-10-CM | POA: Diagnosis not present

## 2023-12-28 DIAGNOSIS — R262 Difficulty in walking, not elsewhere classified: Secondary | ICD-10-CM | POA: Diagnosis not present

## 2024-01-01 ENCOUNTER — Other Ambulatory Visit (HOSPITAL_BASED_OUTPATIENT_CLINIC_OR_DEPARTMENT_OTHER): Payer: Self-pay

## 2024-01-01 MED ORDER — ALENDRONATE SODIUM 70 MG PO TABS
70.0000 mg | ORAL_TABLET | ORAL | 1 refills | Status: DC
  Filled 2024-01-01: qty 12, 84d supply, fill #0
  Filled 2024-02-02 – 2024-03-29 (×3): qty 12, 84d supply, fill #1

## 2024-01-11 DIAGNOSIS — R29898 Other symptoms and signs involving the musculoskeletal system: Secondary | ICD-10-CM | POA: Diagnosis not present

## 2024-01-11 DIAGNOSIS — R2689 Other abnormalities of gait and mobility: Secondary | ICD-10-CM | POA: Diagnosis not present

## 2024-01-11 DIAGNOSIS — R262 Difficulty in walking, not elsewhere classified: Secondary | ICD-10-CM | POA: Diagnosis not present

## 2024-01-11 DIAGNOSIS — M25561 Pain in right knee: Secondary | ICD-10-CM | POA: Diagnosis not present

## 2024-01-11 DIAGNOSIS — M1711 Unilateral primary osteoarthritis, right knee: Secondary | ICD-10-CM | POA: Diagnosis not present

## 2024-01-15 ENCOUNTER — Other Ambulatory Visit (HOSPITAL_BASED_OUTPATIENT_CLINIC_OR_DEPARTMENT_OTHER): Payer: Self-pay

## 2024-01-17 ENCOUNTER — Other Ambulatory Visit (HOSPITAL_BASED_OUTPATIENT_CLINIC_OR_DEPARTMENT_OTHER): Payer: Self-pay

## 2024-01-25 DIAGNOSIS — M1711 Unilateral primary osteoarthritis, right knee: Secondary | ICD-10-CM | POA: Diagnosis not present

## 2024-01-25 DIAGNOSIS — R2689 Other abnormalities of gait and mobility: Secondary | ICD-10-CM | POA: Diagnosis not present

## 2024-01-25 DIAGNOSIS — R29898 Other symptoms and signs involving the musculoskeletal system: Secondary | ICD-10-CM | POA: Diagnosis not present

## 2024-01-25 DIAGNOSIS — R262 Difficulty in walking, not elsewhere classified: Secondary | ICD-10-CM | POA: Diagnosis not present

## 2024-01-25 DIAGNOSIS — M25561 Pain in right knee: Secondary | ICD-10-CM | POA: Diagnosis not present

## 2024-02-01 DIAGNOSIS — R2689 Other abnormalities of gait and mobility: Secondary | ICD-10-CM | POA: Diagnosis not present

## 2024-02-01 DIAGNOSIS — R262 Difficulty in walking, not elsewhere classified: Secondary | ICD-10-CM | POA: Diagnosis not present

## 2024-02-01 DIAGNOSIS — R29898 Other symptoms and signs involving the musculoskeletal system: Secondary | ICD-10-CM | POA: Diagnosis not present

## 2024-02-01 DIAGNOSIS — M25561 Pain in right knee: Secondary | ICD-10-CM | POA: Diagnosis not present

## 2024-02-01 DIAGNOSIS — M1711 Unilateral primary osteoarthritis, right knee: Secondary | ICD-10-CM | POA: Diagnosis not present

## 2024-02-02 ENCOUNTER — Other Ambulatory Visit (HOSPITAL_BASED_OUTPATIENT_CLINIC_OR_DEPARTMENT_OTHER): Payer: Self-pay

## 2024-02-07 ENCOUNTER — Other Ambulatory Visit (HOSPITAL_BASED_OUTPATIENT_CLINIC_OR_DEPARTMENT_OTHER): Payer: Self-pay

## 2024-02-08 ENCOUNTER — Other Ambulatory Visit (HOSPITAL_BASED_OUTPATIENT_CLINIC_OR_DEPARTMENT_OTHER): Payer: Self-pay

## 2024-02-13 DIAGNOSIS — M1711 Unilateral primary osteoarthritis, right knee: Secondary | ICD-10-CM | POA: Diagnosis not present

## 2024-02-15 ENCOUNTER — Other Ambulatory Visit (HOSPITAL_COMMUNITY): Payer: Self-pay

## 2024-02-27 DIAGNOSIS — R2689 Other abnormalities of gait and mobility: Secondary | ICD-10-CM | POA: Diagnosis not present

## 2024-02-27 DIAGNOSIS — M1711 Unilateral primary osteoarthritis, right knee: Secondary | ICD-10-CM | POA: Diagnosis not present

## 2024-02-27 DIAGNOSIS — M25561 Pain in right knee: Secondary | ICD-10-CM | POA: Diagnosis not present

## 2024-02-27 DIAGNOSIS — R29898 Other symptoms and signs involving the musculoskeletal system: Secondary | ICD-10-CM | POA: Diagnosis not present

## 2024-02-27 DIAGNOSIS — R262 Difficulty in walking, not elsewhere classified: Secondary | ICD-10-CM | POA: Diagnosis not present

## 2024-03-06 ENCOUNTER — Encounter: Payer: Self-pay | Admitting: Psychiatry

## 2024-03-06 ENCOUNTER — Ambulatory Visit: Admitting: Psychiatry

## 2024-03-06 ENCOUNTER — Other Ambulatory Visit: Payer: Self-pay

## 2024-03-06 ENCOUNTER — Other Ambulatory Visit (HOSPITAL_BASED_OUTPATIENT_CLINIC_OR_DEPARTMENT_OTHER): Payer: Self-pay

## 2024-03-06 DIAGNOSIS — F40240 Claustrophobia: Secondary | ICD-10-CM

## 2024-03-06 DIAGNOSIS — F330 Major depressive disorder, recurrent, mild: Secondary | ICD-10-CM | POA: Diagnosis not present

## 2024-03-06 DIAGNOSIS — F422 Mixed obsessional thoughts and acts: Secondary | ICD-10-CM

## 2024-03-06 DIAGNOSIS — G3184 Mild cognitive impairment, so stated: Secondary | ICD-10-CM | POA: Diagnosis not present

## 2024-03-06 MED ORDER — BUPROPION HCL ER (XL) 300 MG PO TB24
300.0000 mg | ORAL_TABLET | Freq: Every day | ORAL | 0 refills | Status: AC
  Filled 2024-03-06 – 2024-04-18 (×2): qty 90, 90d supply, fill #0

## 2024-03-06 MED ORDER — FLUVOXAMINE MALEATE 100 MG PO TABS
100.0000 mg | ORAL_TABLET | Freq: Two times a day (BID) | ORAL | 0 refills | Status: AC
  Filled 2024-03-06: qty 180, 90d supply, fill #0

## 2024-03-06 NOTE — Progress Notes (Signed)
 Julia Holt [id number] [birth year] 63 y.o.  Subjective:   Patient ID:  Julia Holt is a 63 y.o. (DOB August 19, 1960) adult.  Chief Complaint:  Chief Complaint  Patient presents with   Follow-up   Memory Loss   Anxiety    Julia Holt presents to the office today for follow-up of psychiatric diagnoses.  seen March 2020.  Since then she elected to stop Wellbutrin  out of concern about potential interaction with propranolol  and drug screens at work.  Her mother died Jan 18, 2019 she had dementia.  At the end she did not recognize her daughter.  It's been really hard.  Misses her.  She called in June asking to increase fluvoxamine  to 150 or 200 mg daily.  Started travelling to [place] for work in July and so stressed by the commute.  Exhausted by the end of the day.  Prayed a lot about it.  Feels God told her to resign.  Leaving October 2.  Can't handle the drive anymore.  Anxious driving.  Feels good about the decision.  Julia Holt has driven her some of the time.  Going into job search at [employer].    seen March 06, 2019.  She was still dealing with grief over the loss of her mother.  We made no med change.  She continued fluvoxamine  200 mg daily as well as propranolol  20 mg twice daily as needed anxiety.  seen January 2021.  No med changes.    10/30/19  Appt with the following noted: No job yet.  Still looking, but expects to get hired today.  Got through mother's day OK.  Managing OK.   Taking the propranolol  mainly just once daily.  Still struggling over mother's death with holidays.  Ashes in urn with her dad.  All the political and social unrest watching CNN is depressing.   No med changes  04/10/20 appt with the following noted: Doing well.  Some stress with new job and using a little more propranolol  and it's been helpful for anxiety.  Taking 20 mg propranolol  daily. Plan: Satisfied with the medications.  No change indicated. Continue fluvoxamine  200 mg  daily. Propranolol  20 mg prn anxiety.  12/09/2020 appointment with the following noted: A little more tired lately. H low platelets and has been sick.  Stressful.  Up and down is frustrating.  He's tired all the time.  Worries over H.  Wants him to feel better.  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 No SE. Patient reports grief issues but not depression.    Some anxierty and taking the propranolol  daily but it helps.  Overall handling things OK.  Patient denies difficulty with sleep initiation or maintenance, but some negative effects temporarily related to grief and stress work.  I get enough sleep.  Denies appetite disturbance.  Patient reports that energy and motivation have been good.  Patient denies any difficulty with concentration.  Patient denies any suicidal ideation. Plan: Satisfied with the medications.  No change indicated. Continue fluvoxamine  200 mg daily. Propranolol  20 mg prn anxiety.  09/09/21 appt noted: On fluvoxamine  100 BID, propranolol  20 mg AM and not weekends.  Sometimes needed at night.   Lost 20# which helped knees.  Thyroid  labs mixed up.  From hyper to hypothyroidism.  Meds are starting to work.  Still tired end of the day, but energy is better.  Not irritable.  Less cold intolerant.   Lost dog to age.  Got another Shitzu. Sleep  is good. No SE Minimal OCD.  Some days more anxiety.  Propranolol  helps.  06/24/22 appt noted: More down and low energy and not motivated.  Going on a few months. Normal thyroid .   Not much anxiety.  08/26/22  TC  More anxiety.  More insulin  resistance with DM.  Struggling with glucose. DC propranolol  Increase fluvoxamine  to 100 mg AM and 150 mg HS Lorazepam  0.5-1 mg bID prn until fluvoxamine  helps     10/19/22 appt noted: Increased fluvoxamine  200 mg daily. Tried lorazepam0.5 mg BID and working very well. Going well with new insulin  pump.  Frustrating experience causing crying spells.  It is working . No dep since  Wellbutrin .  Anxiety better now mild. No SE. Sleep good  06/26/23 appt noted: Psych med:  fluvoxamine  100 BID, Wellbutrin  XL 300 AM, lorazepam  0.5 mg TID prn usually 1 daily and really helps, propranolol  20 BID. DX with leg pain and elevated CK levels.  Referred to rheum and neuro.  Good days and bad days with pain and weakness.  Pending MRI and nerve conduction study.   Thinks mental health is overall fine.  Not knowing what's going on.   No SE.   Not markedly depressed.    03/06/24 appt noted;  Psych med:  fluvoxamine  100 BID, Wellbutrin  XL 300 AM, not taking lorazepam  0.5 mg TID prn usually 1 daily and really helps, propranolol  20 BID. Pending knee surgery TKR next week DT pain interfering with mobility.   Saw neurology and stopped lorazepam  to see if was causing forgetfulness.   No problems with sleep 8 hours.  A little bit of depression.   No SE noted.     Past Psychiatric Medication Trials:  Fluvoxamine  200, venlafaxine, Wellbutrin  300 helped, paroxetine 40 wt gain, propranolol  , lorazepam   Her last episode of depression occurred in July 2019 and we increase the Wellbutrin  to 300 mg and it resolved  Review of Systems:  Review of Systems  Constitutional:  Positive for fatigue.  Cardiovascular:  Negative for palpitations.  Neurological:  Negative for tremors and weakness.  Psychiatric/Behavioral:  Positive for dysphoric mood. Negative for agitation, behavioral problems, confusion, decreased concentration, hallucinations, self-injury, sleep disturbance and suicidal ideas. The patient is nervous/anxious. The patient is not hyperactive.     Medications: I have reviewed the patient's current medications.  Current Outpatient Medications  Medication Sig Dispense Refill   alendronate  (FOSAMAX ) 70 MG tablet Take 1 tablet (70 mg total) by mouth every 7 days. In morning w/ full glass of water on an empty stomach,don't lie down x 30 min 12 tablet 1   amLODipine  (NORVASC ) 10 MG tablet Take 1  tablet (10 mg total) by mouth daily. 90 tablet 1   amoxicillin  (AMOXIL ) 500 MG capsule Take 4 capsules (2,000 mg total) by mouth before dental treatment 16 capsule 1   aspirin  81 MG chewable tablet Chew 1 tablet (81 mg total) by mouth daily.     Calcium Carbonate-Vitamin D3 600-400 MG-UNIT TABS Take by mouth 2 (two) times daily.     Continuous Blood Gluc Receiver (DEXCOM G6 RECEIVER) DEVI USE AS DIRECTED TO CHECK BLOOD SUGARS     Continuous Blood Gluc Sensor (DEXCOM G6 SENSOR) MISC Apply topically as directed.     Continuous Glucose Transmitter (DEXCOM G6 TRANSMITTER) MISC Use to check blood sugars as directed 1 each 2   HUMALOG  100 UNIT/ML injection Use up to 60 units daily in insulin  pump Maximum Daily Dose=60 units 60 mL 2   Insulin   Human (INSULIN  PUMP) SOLN Inject into the skin. Husband has no idea what kind of insulin  she takes. Husband states she is on a insulin  pump. Pharmacy has a record of Novolog  60 units twice daily.     INSULIN  SYRINGE 1CC/29G 29G X 1/2 1 ML MISC Use to inject insulin  daily 50 each 1   levothyroxine  (SYNTHROID ) 112 MCG tablet Take 1 tablet (112 mcg total) by mouth every morning. 90 tablet 1   lisinopril  (ZESTRIL ) 40 MG tablet Take 1 tablet (40 mg total) by mouth daily. 90 tablet 3   Multiple Vitamins-Minerals (MULTIVITAMIN ADULTS 50+ PO) Take by mouth.     prednisoLONE  acetate (PRED FORTE ) 1 % ophthalmic suspension Instill 1 drop into right eye as directed: 1 drop every 2 hours in right eye x 1 day, then 1 drop 4 times a day in right eye for 1 week, then 1 drop right eye twice a day for 1 week 10 mL 0   propranolol  (INDERAL ) 20 MG tablet Take 1 tablet (20 mg total) by mouth 2 (two) times daily. 180 tablet 1   buPROPion  (WELLBUTRIN  XL) 300 MG 24 hr tablet Take 1 tablet (300 mg total) by mouth daily. 90 tablet 0   Continuous Glucose Sensor (DEXCOM G6 SENSOR) MISC Use as directed every 10 days 9 each 2   fluvoxaMINE  (LUVOX ) 100 MG tablet Take 1 tablet (100 mg total) by  mouth 2 (two) times daily. 180 tablet 0   LORazepam  (ATIVAN ) 0.5 MG tablet Take 1-2 tablets (0.5-1 mg total) by mouth 2 (two) times daily as needed for anxiety (Patient not taking: Reported on 03/06/2024) 100 tablet 4   spironolactone  (ALDACTONE ) 50 MG tablet Take 1 tablet (50 mg total) by mouth daily. Keep appointment on 04/26/22 for future refills. (Patient not taking: Reported on 03/06/2024) 90 tablet 1   spironolactone  (ALDACTONE ) 50 MG tablet Take 1 tablet (50 mg total) by mouth daily. (Patient not taking: Reported on 03/06/2024) 90 tablet 0   No current facility-administered medications for this visit.    Medication Side Effects: None  Allergies:  Allergies  Allergen Reactions   Latex Other (See Comments)   Sulfa Antibiotics Hives and Itching    Past Medical History:  Diagnosis Date   Anxiety    Depression    Diabetes mellitus without complication (HCC)    Hypertension    Thyroid  disease     Family History  Problem Relation Age of Onset   Hypertension Mother    Hyperlipidemia Mother    Dementia Mother    Alzheimer's disease Father     Social History   Socioeconomic History   Marital status: Married    Spouse name: Not on file   Number of children: Not on file   Years of education: Not on file   Highest education level: Not on file  Occupational History   Not on file  Tobacco Use   Smoking status: Never   Smokeless tobacco: Never  Vaping Use   Vaping status: Never Used  Substance and Sexual Activity   Alcohol use: No   Drug use: No   Sexual activity: Yes  Other Topics Concern   Not on file  Social History Narrative   Not on file   Social Drivers of Health   Financial Resource Strain: Not on file  Food Insecurity: Unknown (03/19/2023)   Received from [facility]   Hunger Vital Sign    Within the past 12 months, you worried that your food would run  out before you got money to buy more: Patient declined to answer    Within the past 12 months, the food  you bought just didn't last and you didn't have money to get more. : Patient declined to answer  Transportation Needs: Not on file (03/19/2023)  Physical Activity: Not on file  Stress: Not on file  Social Connections: Not on file  Intimate Partner Violence: Not on file    Past Medical History, Surgical history, Social history, and Family history were reviewed and updated as appropriate.   Please see review of systems for further details on the patient's review from today.   Objective:   Physical Exam:  There were no vitals taken for this visit.  Physical Exam Constitutional:      General: Rhylynn is not in acute distress.    Appearance: Beyonca is well-developed.  Musculoskeletal:        General: No deformity.  Neurological:     Mental Status: Juanisha is alert and oriented to person, place, and time.     Motor: No tremor.     Coordination: Coordination normal.     Gait: Gait normal.  Psychiatric:        Attention and Perception: Avelyn does not perceive auditory hallucinations.        Mood and Affect: Mood is anxious. Mood is not depressed. Affect is not labile, blunt, angry, tearful or inappropriate.        Speech: Speech normal. Speech is not slurred.        Behavior: Behavior normal.        Thought Content: Thought content normal. Thought content is not delusional. Thought content does not include homicidal or suicidal ideation. Thought content does not include suicidal plan.        Cognition and Memory: Cognition normal. Christin exhibits impaired recent memory.        Judgment: Judgment normal.     Comments: Insight intact. No auditory or visual hallucinations.  Anxiety is manageable.      Lab Review:     Component Value Date/Time   NA 137 03/17/2022 0910   NA 139 07/31/2020 1540   K 4.2 03/17/2022 0910   CL 102 03/17/2022 0910   CO2 29 03/17/2022 0910   GLUCOSE 108 (H) 03/17/2022 0910   BUN 16 03/17/2022 0910   BUN 18 07/31/2020 1540   CREATININE 0.91 03/17/2022 0910    CALCIUM 9.7 03/17/2022 0910   PROT 7.4 09/10/2021 0857   ALBUMIN 4.7 09/10/2021 0857   AST 24 09/10/2021 0857   ALT 18 09/10/2021 0857   ALKPHOS 80 09/10/2021 0857   BILITOT 0.5 09/10/2021 0857   GFRNONAA 84 07/31/2020 1540   GFRAA 97 07/31/2020 1540       Component Value Date/Time   WBC 6.5 09/10/2021 0857   RBC 4.60 09/10/2021 0857   HGB 14.8 09/10/2021 0857   HGB 14.8 07/31/2020 1540   HCT 43.8 09/10/2021 0857   HCT 44.7 07/31/2020 1540   PLT 241.0 09/10/2021 0857   PLT 237 07/31/2020 1540   MCV 95.2 09/10/2021 0857   MCV 94 07/31/2020 1540   MCH 31.0 07/31/2020 1540   MCH 32.3 06/06/2016 0416   MCHC 33.8 09/10/2021 0857   RDW 12.8 09/10/2021 0857   RDW 11.9 07/31/2020 1540   LYMPHSABS 1.7 06/04/2016 0448   MONOABS 0.6 06/04/2016 0448   EOSABS 0.0 06/04/2016 0448   BASOSABS 0.0 06/04/2016 0448    No results found for: POCLITH, LITHIUM   No  results found for: PHENYTOIN, PHENOBARB, VALPROATE, CBMZ   .res Assessment: Plan:    Depression, major, recurrent, mild (HCC) - Plan: fluvoxaMINE  (LUVOX ) 100 MG tablet, buPROPion  (WELLBUTRIN  XL) 300 MG 24 hr tablet  Mixed obsessional thoughts and acts - Plan: fluvoxaMINE  (LUVOX ) 100 MG tablet  Claustrophobia  Mild cognitive impairment    Historically her depressive episodes have typically been triggered. Anxiety and Dep controlled generally. Worsening memory px since last visit affecting work performance.  Seen neurologist, Dr. Camellia Custard.  Off BZ  Thyroid  problems are being addressed.  Because it is been helpful continue propranolol  low-dose 20 mg twice a day as needed for anxiety.  Discussed the possibility it could mask symptoms of hypoglycemia but at this tiny dose that should be not a problem.  Wellbutrin  XL 150 then 300 mg AM.  It worked in the past and is helpful Continue fluvoxamine  200 mg daily  .  Increase was helpful.   Disc SE in detail and SSRI withdrawal sx.  No med changes, but  consider reduction fluvoxamine  to see if cognition is better.    Follow-up  mos  Lorene Macintosh MD, DFAPA   Please see After Visit Summary for patient specific instructions.  No future appointments.   No orders of the defined types were placed in this encounter.     -------------------------------

## 2024-03-07 ENCOUNTER — Other Ambulatory Visit (HOSPITAL_BASED_OUTPATIENT_CLINIC_OR_DEPARTMENT_OTHER): Payer: Self-pay

## 2024-03-07 ENCOUNTER — Other Ambulatory Visit: Payer: Self-pay

## 2024-03-08 ENCOUNTER — Other Ambulatory Visit (HOSPITAL_BASED_OUTPATIENT_CLINIC_OR_DEPARTMENT_OTHER): Payer: Self-pay

## 2024-03-14 ENCOUNTER — Other Ambulatory Visit (HOSPITAL_BASED_OUTPATIENT_CLINIC_OR_DEPARTMENT_OTHER): Payer: Self-pay

## 2024-03-14 ENCOUNTER — Other Ambulatory Visit: Payer: Self-pay

## 2024-03-14 DIAGNOSIS — M1711 Unilateral primary osteoarthritis, right knee: Secondary | ICD-10-CM | POA: Diagnosis not present

## 2024-03-14 DIAGNOSIS — E109 Type 1 diabetes mellitus without complications: Secondary | ICD-10-CM | POA: Diagnosis not present

## 2024-03-14 DIAGNOSIS — E1065 Type 1 diabetes mellitus with hyperglycemia: Secondary | ICD-10-CM | POA: Diagnosis not present

## 2024-03-14 MED ORDER — ASPIRIN 325 MG PO TBEC
325.0000 mg | DELAYED_RELEASE_TABLET | Freq: Two times a day (BID) | ORAL | 0 refills | Status: AC
  Filled 2024-03-14: qty 100, 50d supply, fill #0

## 2024-03-14 MED ORDER — AMOXICILLIN 500 MG PO CAPS
1000.0000 mg | ORAL_CAPSULE | Freq: Two times a day (BID) | ORAL | 1 refills | Status: AC
  Filled 2024-03-14: qty 30, 8d supply, fill #0

## 2024-03-14 MED ORDER — CALCITRIOL 0.5 MCG PO CAPS
0.5000 ug | ORAL_CAPSULE | Freq: Every day | ORAL | 0 refills | Status: DC
  Filled 2024-03-14: qty 45, 45d supply, fill #0

## 2024-03-14 MED ORDER — OXYCODONE HCL 5 MG PO TABS
5.0000 mg | ORAL_TABLET | ORAL | 0 refills | Status: AC | PRN
  Filled 2024-03-14: qty 21, 4d supply, fill #0

## 2024-03-15 ENCOUNTER — Other Ambulatory Visit (HOSPITAL_BASED_OUTPATIENT_CLINIC_OR_DEPARTMENT_OTHER): Payer: Self-pay

## 2024-03-15 ENCOUNTER — Other Ambulatory Visit (HOSPITAL_COMMUNITY): Payer: Self-pay

## 2024-03-18 DIAGNOSIS — R2689 Other abnormalities of gait and mobility: Secondary | ICD-10-CM | POA: Diagnosis not present

## 2024-03-18 DIAGNOSIS — M25561 Pain in right knee: Secondary | ICD-10-CM | POA: Diagnosis not present

## 2024-03-18 DIAGNOSIS — R262 Difficulty in walking, not elsewhere classified: Secondary | ICD-10-CM | POA: Diagnosis not present

## 2024-03-18 DIAGNOSIS — R29898 Other symptoms and signs involving the musculoskeletal system: Secondary | ICD-10-CM | POA: Diagnosis not present

## 2024-03-18 DIAGNOSIS — M1711 Unilateral primary osteoarthritis, right knee: Secondary | ICD-10-CM | POA: Diagnosis not present

## 2024-03-25 DIAGNOSIS — R29898 Other symptoms and signs involving the musculoskeletal system: Secondary | ICD-10-CM | POA: Diagnosis not present

## 2024-03-25 DIAGNOSIS — M25561 Pain in right knee: Secondary | ICD-10-CM | POA: Diagnosis not present

## 2024-03-25 DIAGNOSIS — R262 Difficulty in walking, not elsewhere classified: Secondary | ICD-10-CM | POA: Diagnosis not present

## 2024-03-25 DIAGNOSIS — R2689 Other abnormalities of gait and mobility: Secondary | ICD-10-CM | POA: Diagnosis not present

## 2024-03-25 DIAGNOSIS — M1711 Unilateral primary osteoarthritis, right knee: Secondary | ICD-10-CM | POA: Diagnosis not present

## 2024-03-26 ENCOUNTER — Other Ambulatory Visit (HOSPITAL_BASED_OUTPATIENT_CLINIC_OR_DEPARTMENT_OTHER): Payer: Self-pay

## 2024-03-28 ENCOUNTER — Other Ambulatory Visit (HOSPITAL_BASED_OUTPATIENT_CLINIC_OR_DEPARTMENT_OTHER): Payer: Self-pay

## 2024-03-29 ENCOUNTER — Other Ambulatory Visit (HOSPITAL_BASED_OUTPATIENT_CLINIC_OR_DEPARTMENT_OTHER): Payer: Self-pay

## 2024-04-01 DIAGNOSIS — R2689 Other abnormalities of gait and mobility: Secondary | ICD-10-CM | POA: Diagnosis not present

## 2024-04-01 DIAGNOSIS — R29898 Other symptoms and signs involving the musculoskeletal system: Secondary | ICD-10-CM | POA: Diagnosis not present

## 2024-04-01 DIAGNOSIS — M25561 Pain in right knee: Secondary | ICD-10-CM | POA: Diagnosis not present

## 2024-04-01 DIAGNOSIS — R262 Difficulty in walking, not elsewhere classified: Secondary | ICD-10-CM | POA: Diagnosis not present

## 2024-04-01 DIAGNOSIS — M1711 Unilateral primary osteoarthritis, right knee: Secondary | ICD-10-CM | POA: Diagnosis not present

## 2024-04-03 DIAGNOSIS — M1711 Unilateral primary osteoarthritis, right knee: Secondary | ICD-10-CM | POA: Diagnosis not present

## 2024-04-03 DIAGNOSIS — R29898 Other symptoms and signs involving the musculoskeletal system: Secondary | ICD-10-CM | POA: Diagnosis not present

## 2024-04-03 DIAGNOSIS — R2689 Other abnormalities of gait and mobility: Secondary | ICD-10-CM | POA: Diagnosis not present

## 2024-04-03 DIAGNOSIS — R262 Difficulty in walking, not elsewhere classified: Secondary | ICD-10-CM | POA: Diagnosis not present

## 2024-04-03 DIAGNOSIS — M25561 Pain in right knee: Secondary | ICD-10-CM | POA: Diagnosis not present

## 2024-04-08 DIAGNOSIS — M1711 Unilateral primary osteoarthritis, right knee: Secondary | ICD-10-CM | POA: Diagnosis not present

## 2024-04-08 DIAGNOSIS — R262 Difficulty in walking, not elsewhere classified: Secondary | ICD-10-CM | POA: Diagnosis not present

## 2024-04-08 DIAGNOSIS — R2689 Other abnormalities of gait and mobility: Secondary | ICD-10-CM | POA: Diagnosis not present

## 2024-04-08 DIAGNOSIS — M25561 Pain in right knee: Secondary | ICD-10-CM | POA: Diagnosis not present

## 2024-04-08 DIAGNOSIS — R29898 Other symptoms and signs involving the musculoskeletal system: Secondary | ICD-10-CM | POA: Diagnosis not present

## 2024-04-10 DIAGNOSIS — M1711 Unilateral primary osteoarthritis, right knee: Secondary | ICD-10-CM | POA: Diagnosis not present

## 2024-04-10 DIAGNOSIS — R29898 Other symptoms and signs involving the musculoskeletal system: Secondary | ICD-10-CM | POA: Diagnosis not present

## 2024-04-10 DIAGNOSIS — R2689 Other abnormalities of gait and mobility: Secondary | ICD-10-CM | POA: Diagnosis not present

## 2024-04-10 DIAGNOSIS — M25561 Pain in right knee: Secondary | ICD-10-CM | POA: Diagnosis not present

## 2024-04-10 DIAGNOSIS — R262 Difficulty in walking, not elsewhere classified: Secondary | ICD-10-CM | POA: Diagnosis not present

## 2024-04-18 ENCOUNTER — Other Ambulatory Visit: Payer: Self-pay | Admitting: Nurse Practitioner

## 2024-04-18 ENCOUNTER — Other Ambulatory Visit (HOSPITAL_BASED_OUTPATIENT_CLINIC_OR_DEPARTMENT_OTHER): Payer: Self-pay

## 2024-04-18 ENCOUNTER — Other Ambulatory Visit: Payer: Self-pay

## 2024-04-18 MED ORDER — CALCITRIOL 0.5 MCG PO CAPS
0.5000 ug | ORAL_CAPSULE | Freq: Every day | ORAL | 0 refills | Status: AC
  Filled 2024-04-18: qty 45, 45d supply, fill #0

## 2024-04-18 MED ORDER — AMLODIPINE BESYLATE 10 MG PO TABS
10.0000 mg | ORAL_TABLET | Freq: Every day | ORAL | 1 refills | Status: AC
  Filled 2024-04-18: qty 90, 90d supply, fill #0

## 2024-04-22 ENCOUNTER — Encounter: Payer: Self-pay | Admitting: Radiology

## 2024-04-24 DIAGNOSIS — I10 Essential (primary) hypertension: Secondary | ICD-10-CM | POA: Diagnosis not present

## 2024-04-24 DIAGNOSIS — Z794 Long term (current) use of insulin: Secondary | ICD-10-CM | POA: Diagnosis not present

## 2024-04-24 DIAGNOSIS — E1065 Type 1 diabetes mellitus with hyperglycemia: Secondary | ICD-10-CM | POA: Diagnosis not present

## 2024-04-24 DIAGNOSIS — E039 Hypothyroidism, unspecified: Secondary | ICD-10-CM | POA: Diagnosis not present

## 2024-04-26 DIAGNOSIS — R2689 Other abnormalities of gait and mobility: Secondary | ICD-10-CM | POA: Diagnosis not present

## 2024-04-26 DIAGNOSIS — R29898 Other symptoms and signs involving the musculoskeletal system: Secondary | ICD-10-CM | POA: Diagnosis not present

## 2024-04-26 DIAGNOSIS — R262 Difficulty in walking, not elsewhere classified: Secondary | ICD-10-CM | POA: Diagnosis not present

## 2024-04-26 DIAGNOSIS — M25561 Pain in right knee: Secondary | ICD-10-CM | POA: Diagnosis not present

## 2024-04-26 DIAGNOSIS — M1711 Unilateral primary osteoarthritis, right knee: Secondary | ICD-10-CM | POA: Diagnosis not present

## 2024-04-29 DIAGNOSIS — Z96651 Presence of right artificial knee joint: Secondary | ICD-10-CM | POA: Diagnosis not present

## 2024-05-08 DIAGNOSIS — M1711 Unilateral primary osteoarthritis, right knee: Secondary | ICD-10-CM | POA: Diagnosis not present

## 2024-05-08 DIAGNOSIS — R29898 Other symptoms and signs involving the musculoskeletal system: Secondary | ICD-10-CM | POA: Diagnosis not present

## 2024-05-08 DIAGNOSIS — M25561 Pain in right knee: Secondary | ICD-10-CM | POA: Diagnosis not present

## 2024-05-08 DIAGNOSIS — R2689 Other abnormalities of gait and mobility: Secondary | ICD-10-CM | POA: Diagnosis not present

## 2024-05-08 DIAGNOSIS — R262 Difficulty in walking, not elsewhere classified: Secondary | ICD-10-CM | POA: Diagnosis not present

## 2024-06-02 ENCOUNTER — Other Ambulatory Visit (HOSPITAL_BASED_OUTPATIENT_CLINIC_OR_DEPARTMENT_OTHER): Payer: Self-pay

## 2024-06-03 ENCOUNTER — Other Ambulatory Visit (HOSPITAL_BASED_OUTPATIENT_CLINIC_OR_DEPARTMENT_OTHER): Payer: Self-pay

## 2024-06-03 MED ORDER — LEVOTHYROXINE SODIUM 112 MCG PO TABS
112.0000 ug | ORAL_TABLET | Freq: Every morning | ORAL | 1 refills | Status: AC
  Filled 2024-06-03 – 2024-06-06 (×2): qty 90, 90d supply, fill #0

## 2024-06-14 DIAGNOSIS — E109 Type 1 diabetes mellitus without complications: Secondary | ICD-10-CM | POA: Diagnosis not present

## 2024-06-14 DIAGNOSIS — E1065 Type 1 diabetes mellitus with hyperglycemia: Secondary | ICD-10-CM | POA: Diagnosis not present

## 2024-06-19 ENCOUNTER — Other Ambulatory Visit (HOSPITAL_BASED_OUTPATIENT_CLINIC_OR_DEPARTMENT_OTHER): Payer: Self-pay

## 2024-06-19 MED ORDER — ALENDRONATE SODIUM 70 MG PO TABS
70.0000 mg | ORAL_TABLET | ORAL | 2 refills | Status: AC
  Filled 2024-06-19 – 2024-07-24 (×2): qty 12, 84d supply, fill #0

## 2024-07-03 ENCOUNTER — Encounter (HOSPITAL_BASED_OUTPATIENT_CLINIC_OR_DEPARTMENT_OTHER): Payer: Self-pay

## 2024-07-03 ENCOUNTER — Other Ambulatory Visit (HOSPITAL_BASED_OUTPATIENT_CLINIC_OR_DEPARTMENT_OTHER): Payer: Self-pay

## 2024-07-03 ENCOUNTER — Other Ambulatory Visit: Payer: Self-pay

## 2024-07-03 MED ORDER — HUMALOG 100 UNIT/ML IJ SOLN
60.0000 [IU] | Freq: Every day | INTRAMUSCULAR | 0 refills | Status: AC
  Filled 2024-07-03: qty 50, 83d supply, fill #0

## 2024-07-04 ENCOUNTER — Other Ambulatory Visit (HOSPITAL_BASED_OUTPATIENT_CLINIC_OR_DEPARTMENT_OTHER): Payer: Self-pay

## 2024-07-05 ENCOUNTER — Other Ambulatory Visit (HOSPITAL_BASED_OUTPATIENT_CLINIC_OR_DEPARTMENT_OTHER): Payer: Self-pay

## 2024-07-12 ENCOUNTER — Other Ambulatory Visit (HOSPITAL_BASED_OUTPATIENT_CLINIC_OR_DEPARTMENT_OTHER): Payer: Self-pay

## 2024-07-24 ENCOUNTER — Other Ambulatory Visit (HOSPITAL_BASED_OUTPATIENT_CLINIC_OR_DEPARTMENT_OTHER): Payer: Self-pay

## 2024-08-06 ENCOUNTER — Ambulatory Visit: Admitting: Psychiatry
